# Patient Record
Sex: Male | Born: 1955 | Race: White | Hispanic: No | Marital: Married | Smoking: Current every day smoker
Health system: Southern US, Community
[De-identification: ages and names within clinical notes are randomized; demographics above are authoritative.]

## PROBLEM LIST (undated history)

## (undated) DIAGNOSIS — R112 Nausea with vomiting, unspecified: Secondary | ICD-10-CM

## (undated) DIAGNOSIS — H269 Unspecified cataract: Secondary | ICD-10-CM

## (undated) DIAGNOSIS — R0600 Dyspnea, unspecified: Secondary | ICD-10-CM

## (undated) DIAGNOSIS — F419 Anxiety disorder, unspecified: Secondary | ICD-10-CM

## (undated) DIAGNOSIS — F32A Depression, unspecified: Secondary | ICD-10-CM

## (undated) DIAGNOSIS — E119 Type 2 diabetes mellitus without complications: Secondary | ICD-10-CM

## (undated) DIAGNOSIS — F329 Major depressive disorder, single episode, unspecified: Secondary | ICD-10-CM

## (undated) DIAGNOSIS — D649 Anemia, unspecified: Secondary | ICD-10-CM

## (undated) DIAGNOSIS — E785 Hyperlipidemia, unspecified: Secondary | ICD-10-CM

## (undated) DIAGNOSIS — Z9889 Other specified postprocedural states: Secondary | ICD-10-CM

## (undated) HISTORY — PX: COLONOSCOPY: SHX174

## (undated) HISTORY — DX: Hyperlipidemia, unspecified: E78.5

## (undated) HISTORY — DX: Anxiety disorder, unspecified: F41.9

## (undated) HISTORY — DX: Anemia, unspecified: D64.9

## (undated) HISTORY — DX: Depression, unspecified: F32.A

## (undated) HISTORY — DX: Major depressive disorder, single episode, unspecified: F32.9

## (undated) HISTORY — PX: CATARACT EXTRACTION: SUR2

## (undated) HISTORY — DX: Unspecified cataract: H26.9

---

## 2000-02-12 ENCOUNTER — Emergency Department (HOSPITAL_COMMUNITY): Admission: EM | Admit: 2000-02-12 | Discharge: 2000-02-12 | Payer: Self-pay

## 2005-11-22 ENCOUNTER — Emergency Department (HOSPITAL_COMMUNITY): Admission: EM | Admit: 2005-11-22 | Discharge: 2005-11-22 | Payer: Self-pay | Admitting: Emergency Medicine

## 2005-11-25 ENCOUNTER — Emergency Department (HOSPITAL_COMMUNITY): Admission: EM | Admit: 2005-11-25 | Discharge: 2005-11-25 | Payer: Self-pay | Admitting: Emergency Medicine

## 2005-11-27 ENCOUNTER — Emergency Department (HOSPITAL_COMMUNITY): Admission: EM | Admit: 2005-11-27 | Discharge: 2005-11-27 | Payer: Self-pay | Admitting: Emergency Medicine

## 2005-11-30 ENCOUNTER — Emergency Department (HOSPITAL_COMMUNITY): Admission: EM | Admit: 2005-11-30 | Discharge: 2005-11-30 | Payer: Self-pay | Admitting: Family Medicine

## 2005-12-15 ENCOUNTER — Emergency Department (HOSPITAL_COMMUNITY): Admission: EM | Admit: 2005-12-15 | Discharge: 2005-12-15 | Payer: Self-pay | Admitting: Emergency Medicine

## 2007-04-24 ENCOUNTER — Ambulatory Visit: Payer: Self-pay | Admitting: Gastroenterology

## 2007-05-07 ENCOUNTER — Ambulatory Visit: Payer: Self-pay | Admitting: Gastroenterology

## 2007-05-07 DIAGNOSIS — K5730 Diverticulosis of large intestine without perforation or abscess without bleeding: Secondary | ICD-10-CM | POA: Insufficient documentation

## 2007-05-07 DIAGNOSIS — D126 Benign neoplasm of colon, unspecified: Secondary | ICD-10-CM | POA: Insufficient documentation

## 2007-05-23 ENCOUNTER — Ambulatory Visit: Payer: Self-pay | Admitting: Gastroenterology

## 2007-05-23 LAB — CONVERTED CEMR LAB
Fecal Occult Blood: NEGATIVE
OCCULT 1: NEGATIVE
OCCULT 4: NEGATIVE

## 2007-12-17 DIAGNOSIS — F411 Generalized anxiety disorder: Secondary | ICD-10-CM | POA: Insufficient documentation

## 2007-12-17 DIAGNOSIS — F329 Major depressive disorder, single episode, unspecified: Secondary | ICD-10-CM | POA: Insufficient documentation

## 2007-12-17 DIAGNOSIS — F3289 Other specified depressive episodes: Secondary | ICD-10-CM | POA: Insufficient documentation

## 2007-12-17 DIAGNOSIS — F32A Depression, unspecified: Secondary | ICD-10-CM | POA: Insufficient documentation

## 2007-12-17 DIAGNOSIS — F17200 Nicotine dependence, unspecified, uncomplicated: Secondary | ICD-10-CM

## 2007-12-17 DIAGNOSIS — J410 Simple chronic bronchitis: Secondary | ICD-10-CM | POA: Insufficient documentation

## 2009-10-22 ENCOUNTER — Emergency Department (HOSPITAL_COMMUNITY): Admission: EM | Admit: 2009-10-22 | Discharge: 2009-10-22 | Payer: Self-pay | Admitting: Emergency Medicine

## 2010-12-01 LAB — POCT I-STAT, CHEM 8
Chloride: 106 mEq/L (ref 96–112)
HCT: 47 % (ref 39.0–52.0)
Hemoglobin: 16 g/dL (ref 13.0–17.0)
TCO2: 27 mmol/L (ref 0–100)

## 2010-12-01 LAB — RAPID URINE DRUG SCREEN, HOSP PERFORMED
Amphetamines: NOT DETECTED
Cocaine: NOT DETECTED
Tetrahydrocannabinol: NOT DETECTED

## 2011-01-25 NOTE — Assessment & Plan Note (Signed)
[facility]                         GASTROENTEROLOGY OFFICE NOTE   Jeremy Shannon, Jeremy Shannon                    MRN:          [id number]  DATE:04/24/2007                            DOB:          Mar 20, 1956    INDICATIONS:  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 Perrin Maltese for evaluation of guaiac-  positive stools.   Jeremy Shannon has been in fairly good health all his life, without serious  medical or surgical problems, except for chronic depression.  Patient,  in early July, developed some acute diarrhea and fever and was seen by  Dr. Perrin Maltese and was found to have guaiac-positive stools.  About that same  time, he started taking NSAIDs and had some melanotic stools, but he  also was taking Pepto Bismol at that time.   Laboratory data was unremarkable, except for slight leukocytosis and  white count of 10,800.  Hemoglobin was 15.7.   Since that time, the patient has been asymptomatic, is having regular  bowel movements, denies dyspepsia or reflux symptoms.  He has no history  of hepatitis, pancreatitis, or hepatobiliary diseases.  He follows a  regular diet and denies alcohol abuse, but does smoke a pack of  cigarettes per day.  He has never had previous GI examinations.   PAST MEDICAL HISTORY:  Remarkable for depression, treated by Dr. Betti Cruz.  He is on fluvoxamine 100 mg t.i.d. and Seroquel 100 mg at bedtime with  p.r.n. alprazolam.   In the past, has had reactions to PREDNISONE.   FAMILY HISTORY:  Positive for breast cancer in his mother, but no known  gastrointestinal processes.   SOCIAL HISTORY:  He is separated from his wife.  He lives by himself.  He has two years of college education.  He works as a Engineer, materials.  He smokes a pack of cigarettes per day and denies ethanol or IV drug  use.   REVIEW OF SYSTEMS:  Otherwise noncontributory, except for some chronic  tinnitus, which is  exacerbated by Prilosec use, given empirically by Dr.  Perrin Maltese.  He denies any cardiovascular, pulmonary, or systemic symptoms at  this time.   EXAMINATION:  He is a healthy-appearing white male, in no distress,  appearing his stated age.  I cannot appreciate stigmata of chronic liver  disease.  He is 5 feet 9 inches tall, weighs 215 pounds.  Blood pressure is  100/74, and pulse was 80 and regular.  I could not appreciate stigmata of chronic liver disease or thyromegaly.  His chest was clear.  He was in a regular rhythm, without murmurs, gallops or rubs.  I could not appreciate hepatosplenomegaly, abdominal masses or  tenderness.  Peripheral extremities were unremarkable.  Mental status was clear.  Recta exam was deferred.   ASSESSMENT:  1. Guaiac-positive stools, rule out false positive test, versus      underlying colonic polyposis.  2. Nicotine addiction, without history of chronic lung disease.  3. Chronic major depression, on major antidepressants, followed by      psychiatry.  4. Family history of breast carcinoma in his mother.  RECOMMENDATIONS:  1. Outpatient colonoscopy at his convenience.  2. Continue other medications, listed above.     Vania Rea. Jarold Motto, MD, Caleen Essex, FAGA  Electronically Signed    DRP/MedQ  DD: 04/24/2007  DT: 04/25/2007  Job #: [id number]   cc:   Jonita Albee, M.D.  Daine Floras, M.D.

## 2011-03-17 ENCOUNTER — Emergency Department (HOSPITAL_COMMUNITY)
Admission: EM | Admit: 2011-03-17 | Discharge: 2011-03-17 | Disposition: A | Discharge status: Home / Self Care | Payer: Self-pay | Attending: Emergency Medicine | Admitting: Emergency Medicine

## 2011-03-17 ENCOUNTER — Emergency Department (HOSPITAL_COMMUNITY): Payer: Self-pay

## 2011-03-17 DIAGNOSIS — R42 Dizziness and giddiness: Secondary | ICD-10-CM | POA: Insufficient documentation

## 2011-03-17 DIAGNOSIS — F17200 Nicotine dependence, unspecified, uncomplicated: Secondary | ICD-10-CM | POA: Insufficient documentation

## 2011-03-17 DIAGNOSIS — F329 Major depressive disorder, single episode, unspecified: Secondary | ICD-10-CM | POA: Insufficient documentation

## 2011-03-17 DIAGNOSIS — R079 Chest pain, unspecified: Secondary | ICD-10-CM | POA: Insufficient documentation

## 2011-03-17 DIAGNOSIS — R0602 Shortness of breath: Secondary | ICD-10-CM | POA: Insufficient documentation

## 2011-03-17 DIAGNOSIS — F3289 Other specified depressive episodes: Secondary | ICD-10-CM | POA: Insufficient documentation

## 2011-03-21 ENCOUNTER — Emergency Department (HOSPITAL_COMMUNITY)
Admission: EM | Admit: 2011-03-21 | Discharge: 2011-03-21 | Disposition: A | Discharge status: Home / Self Care | Payer: Self-pay | Attending: Emergency Medicine | Admitting: Emergency Medicine

## 2011-03-21 DIAGNOSIS — R062 Wheezing: Secondary | ICD-10-CM | POA: Insufficient documentation

## 2011-03-21 DIAGNOSIS — R0602 Shortness of breath: Secondary | ICD-10-CM | POA: Insufficient documentation

## 2011-03-21 DIAGNOSIS — J40 Bronchitis, not specified as acute or chronic: Secondary | ICD-10-CM | POA: Insufficient documentation

## 2011-05-17 ENCOUNTER — Institutional Professional Consult (permissible substitution): Payer: Self-pay | Admitting: Internal Medicine

## 2011-09-08 ENCOUNTER — Ambulatory Visit: Payer: Self-pay

## 2011-09-08 DIAGNOSIS — F411 Generalized anxiety disorder: Secondary | ICD-10-CM

## 2011-10-06 ENCOUNTER — Ambulatory Visit: Payer: Self-pay

## 2011-10-06 DIAGNOSIS — R059 Cough, unspecified: Secondary | ICD-10-CM

## 2011-10-06 DIAGNOSIS — R0602 Shortness of breath: Secondary | ICD-10-CM

## 2011-10-06 DIAGNOSIS — J209 Acute bronchitis, unspecified: Secondary | ICD-10-CM

## 2011-10-06 DIAGNOSIS — R05 Cough: Secondary | ICD-10-CM

## 2011-12-31 ENCOUNTER — Ambulatory Visit: Payer: Self-pay | Admitting: Family Medicine

## 2011-12-31 VITALS — BP 89/59 | HR 89 | Temp 98.0°F | Resp 16 | Ht 69.0 in | Wt 194.6 lb

## 2011-12-31 DIAGNOSIS — F341 Dysthymic disorder: Secondary | ICD-10-CM

## 2011-12-31 DIAGNOSIS — F329 Major depressive disorder, single episode, unspecified: Secondary | ICD-10-CM

## 2011-12-31 DIAGNOSIS — F32A Depression, unspecified: Secondary | ICD-10-CM

## 2011-12-31 MED ORDER — ALPRAZOLAM 0.5 MG PO TABS: 0.5000 mg | ORAL_TABLET | Freq: Three times a day (TID) | ORAL | Status: DC | PRN

## 2011-12-31 NOTE — Progress Notes (Signed)
[facility]:  Office Visit  Chief Complaint:  Chief Complaint  Patient presents with  . Medication Refill    HPI: Jeremy Shannon is a 56 y.o. male who complains of  Medication refills for:  1. Anxiety/Depression. He is on Xanax 0.5 mg TID prn. He is also on seroquel and luvox but gets that free from Dr. Betti Cruz ( psych) He apparently does not get his Xanax from him, he comes to [place] for that. He denies any SEs, denies any abuse potential. Denies any SI/HI/hallucintations.   Past Medical History  Diagnosis Date  . Hyperlipidemia   . Depression   . Anemia    History reviewed. No pertinent past surgical history. History   Social History  . Marital Status: Legally Separated    Spouse Name: N/A    Number of Children: N/A  . Years of Education: N/A   Social History Main Topics  . Smoking status: Current Everyday Smoker -- 0.5 packs/day for 40 years    Types: Cigarettes  . Smokeless tobacco: None  . Alcohol Use: None  . Drug Use: None  . Sexually Active: None   Other Topics Concern  . None   Social History Narrative  . None   No family history on file. Allergies not on file Prior to Admission medications   Medication Sig Start Date End Date Taking? Authorizing Provider  ALPRAZolam Prudy Feeler) 0.5 MG tablet Take 0.5 mg by mouth 3 (three) times daily as needed.   Yes Historical Provider, MD  fluvoxaMINE (LUVOX) 100 MG tablet Take 100 mg by mouth at bedtime.   Yes Historical Provider, MD  QUEtiapine (SEROQUEL) 100 MG tablet Take 100 mg by mouth at bedtime.   Yes Historical Provider, MD     ROS: The patient denies fevers, chills, night sweats, unintentional weight loss, chest pain, palpitations, wheezing, dyspnea on exertion, nausea, vomiting, abdominal pain, dysuria, hematuria, melena, numbness, weakness, or tingling. Denies any SI/HI/hallucinations.  All other systems have been reviewed and were otherwise negative with the exception of those mentioned in  the HPI and as above.    PHYSICAL EXAM: Filed Vitals:   12/31/11 0827  BP: 89/59  Pulse: 89  Temp: 98 F (36.7 C)  Resp: 16   Filed Vitals:   12/31/11 0827  Height: 5\' 9"  (1.753 m)  Weight: 194 lb 9.6 oz (88.27 kg)   Body mass index is 28.74 kg/(m^2).  General: Alert, no acute distress HEENT:  Normocephalic, atraumatic, oropharynx patent.  Cardiovascular:  Regular rate and rhythm, no rubs murmurs or gallops.  No Carotid bruits, radial pulse intact. No pedal edema.  Respiratory: Clear to auscultation bilaterally.  No wheezes, rales, or rhonchi.  No cyanosis, no use of accessory musculature GI: No organomegaly, abdomen is soft and non-tender, positive bowel sounds.  No masses. Skin: No rashes. Neurologic: Facial musculature symmetric. Psychiatric: Patient is appropriate throughout our interaction. Lymphatic: No cervical lymphadenopathy Musculoskeletal: Gait intact.   LABS: Results for orders placed during the hospital encounter of 10/22/09  DRUG SCREEN PANEL, EMERGENCY      Component Value Range   Opiates NONE DETECTED  NONE DETECTED    Cocaine NONE DETECTED  NONE DETECTED    Benzodiazepines POSITIVE (*) NONE DETECTED    Amphetamines NONE DETECTED  NONE DETECTED    Tetrahydrocannabinol NONE DETECTED  NONE DETECTED    Barbiturates    NONE DETECTED    Value: NONE DETECTED  DRUG SCREEN FOR MEDICAL PURPOSES     ONLY.  IF CONFIRMATION IS NEEDED     FOR ANY PURPOSE, NOTIFY LAB     WITHIN 5 DAYS.                LOWEST DETECTABLE LIMITS     FOR URINE DRUG SCREEN     Drug Class       Cutoff (ng/mL)     Amphetamine      1000     Barbiturate      200     Benzodiazepine   200     Tricyclics       300     Opiates          300     Cocaine          300     THC              50  ETHANOL      Component Value Range   Alcohol, Ethyl (B)    0 - 10 (mg/dL)   Value: <5            LOWEST DETECTABLE LIMIT FOR     SERUM ALCOHOL IS 5 mg/dL     FOR MEDICAL PURPOSES ONLY    POCT I-STAT, CHEM 8      Component Value Range   Sodium 139  135 - 145 (mEq/L)   Potassium 4.2  3.5 - 5.1 (mEq/L)   Chloride 106  96 - 112 (mEq/L)   BUN 11  6 - 23 (mg/dL)   Creatinine, Ser 1.0  0.4 - 1.5 (mg/dL)   Glucose, Bld 89  70 - 99 (mg/dL)   Calcium, Ion 4.09 (*) 1.12 - 1.32 (mmol/L)   TCO2 27  0 - 100 (mmol/L)   Hemoglobin 16.0  13.0 - 17.0 (g/dL)   HCT 81.1  91.4 - 78.2 (%)     EKG/XRAY:   Primary read interpreted by Dr. Conley Rolls at [facility].   ASSESSMENT/PLAN: Encounter Diagnosis  Name Primary?  Marland Kitchen Anxiety and depression Yes    Refill Xanax 0.5 mg TID prn #90 with 3 refills. Needs to return to office for future refills. I will try to pull his drug profile for next time.    Loleta Frommelt PHUONG, DO 12/31/2011 3:08 PM

## 2012-02-10 ENCOUNTER — Ambulatory Visit: Payer: Self-pay | Admitting: Family Medicine

## 2012-02-10 VITALS — BP 110/72 | HR 60 | Temp 98.1°F | Resp 16 | Ht 68.0 in | Wt 188.4 lb

## 2012-02-10 DIAGNOSIS — R0609 Other forms of dyspnea: Secondary | ICD-10-CM

## 2012-02-10 DIAGNOSIS — R0600 Dyspnea, unspecified: Secondary | ICD-10-CM

## 2012-02-10 DIAGNOSIS — I959 Hypotension, unspecified: Secondary | ICD-10-CM

## 2012-02-10 MED ORDER — PREDNISONE 20 MG PO TABS: ORAL_TABLET | ORAL | Status: AC

## 2012-02-10 NOTE — Progress Notes (Signed)
Subjective: Patient is here for a couple of reasons today. He went to see the ophthalmologist's for arrangements for his cataract surgery in 2 weeks. When he was in the office they checked his blood pressure and systolic was around 90. They were concerned about his hypotension, and called an ambulance. He was not taking anywhere. He came in here to the office and needs a clearance note before he can have the surgery as planned.  He has been having shortness of breath of the last for 5 days with no coughing. He is not bringing up any phlegm. He has gotten this way in the past he has taken a course of prednisone and antibiotics and cleared up. In reviewing the old record it appears to be was last treated in January and last July for this.  Objective: White male alert and oriented. His TMs are normal. Throat clear. Neck supple without nodes or thyromegaly. No carotid bruits. Chest clear to auscultation. Heart regular without murmurs.  In reviewing his old medical record it appears that his highest blood pressure that we have gotten the last several years has been systolic of 122 and the lowest was a systolic of 86. He usually runs in the region of 100 systolic.  Assessment: Dyspnea Chronic anxiety disorder Cataract Low blood pressure  Plan: Check EKG. I believe a little low blood pressures consistent with his prior record, and requires no special attention. I do not feel that he needs antibiotics for this dyspnea, but will give him a brief course of prednisone and see if we can get his lungs clearing on up. He is to let me know if he is not doing better.

## 2012-02-10 NOTE — Patient Instructions (Signed)
Return if increasing shortness of breath or more phlegm production.

## 2012-02-11 DIAGNOSIS — Z0271 Encounter for disability determination: Secondary | ICD-10-CM

## 2012-04-26 ENCOUNTER — Ambulatory Visit: Payer: Self-pay | Admitting: Family Medicine

## 2012-04-26 VITALS — BP 84/54 | HR 81 | Temp 97.8°F | Resp 18 | Ht 69.0 in | Wt 186.8 lb

## 2012-04-26 DIAGNOSIS — F32A Depression, unspecified: Secondary | ICD-10-CM

## 2012-04-26 DIAGNOSIS — F341 Dysthymic disorder: Secondary | ICD-10-CM

## 2012-04-26 DIAGNOSIS — F329 Major depressive disorder, single episode, unspecified: Secondary | ICD-10-CM

## 2012-04-26 MED ORDER — ALPRAZOLAM 0.5 MG PO TABS: 0.5000 mg | ORAL_TABLET | Freq: Three times a day (TID) | ORAL | Status: DC | PRN

## 2012-04-26 NOTE — Progress Notes (Signed)
[facility]:  Office Visit  Chief Complaint:  Chief Complaint  Patient presents with  . Anxiety    needs a refill on xanax    HPI: Jeremy Shannon is a 56 y.o. male who complains of  Refills for xanax. Denies HI/SI. No other sxs.   Past Medical History  Diagnosis Date  . Hyperlipidemia   . Depression   . Anemia    No past surgical history on file. History   Social History  . Marital Status: Legally Separated    Spouse Name: N/A    Number of Children: N/A  . Years of Education: N/A   Social History Main Topics  . Smoking status: Current Everyday Smoker -- 0.5 packs/day for 40 years    Types: Cigarettes  . Smokeless tobacco: None  . Alcohol Use: None  . Drug Use: None  . Sexually Active: None   Other Topics Concern  . None   Social History Narrative  . None   No family history on file. No Known Allergies Prior to Admission medications   Medication Sig Start Date End Date Taking? Authorizing Provider  albuterol-ipratropium (COMBIVENT) 18-103 MCG/ACT inhaler Inhale 2 puffs into the lungs every 6 (six) hours as needed.   Yes Historical Provider, MD  ALPRAZolam Prudy Feeler) 0.5 MG tablet Take 1 tablet (0.5 mg total) by mouth 3 (three) times daily as needed. 12/31/11  Yes Thao P Le, DO  fluvoxaMINE (LUVOX) 100 MG tablet Take 100 mg by mouth at bedtime.   Yes Historical Provider, MD  predniSONE (DELTASONE) 50 MG tablet Take 50 mg by mouth daily.   Yes Historical Provider, MD  QUEtiapine (SEROQUEL) 100 MG tablet Take 100 mg by mouth at bedtime.   Yes Historical Provider, MD     ROS: The patient denies fevers, chills, night sweats, unintentional weight loss, chest pain, palpitations, wheezing, dyspnea on exertion, nausea, vomiting, abdominal pain, dysuria, hematuria, melena, numbness, weakness, or tingling.   All other systems have been reviewed and were otherwise negative with the exception of those mentioned in the HPI and as above.    PHYSICAL  EXAM: Filed Vitals:   04/26/12 1513  BP: 84/54  Pulse: 81  Temp: 97.8 F (36.6 C)  Resp: 18   Filed Vitals:   04/26/12 1513  Height: 5\' 9"  (1.753 m)  Weight: 186 lb 12.8 oz (84.732 kg)   Body mass index is 27.59 kg/(m^2).  General: Alert, no acute distress HEENT:  Normocephalic, atraumatic, oropharynx patent.  Cardiovascular:  Regular rate and rhythm, no rubs murmurs or gallops.  No Carotid bruits, radial pulse intact. No pedal edema.  Respiratory: Clear to auscultation bilaterally.  No wheezes, rales, or rhonchi.  No cyanosis, no use of accessory musculature GI: No organomegaly, abdomen is soft and non-tender, positive bowel sounds.  No masses. Skin: No rashes. Neurologic: Facial musculature symmetric. Psychiatric: Patient is appropriate throughout our interaction. Lymphatic: No cervical lymphadenopathy Musculoskeletal: Gait intact.   LABS: Results for orders placed during the hospital encounter of 10/22/09  DRUG SCREEN PANEL, EMERGENCY      Component Value Range   Opiates NONE DETECTED  NONE DETECTED   Cocaine NONE DETECTED  NONE DETECTED   Benzodiazepines POSITIVE (*) NONE DETECTED   Amphetamines NONE DETECTED  NONE DETECTED   Tetrahydrocannabinol NONE DETECTED  NONE DETECTED   Barbiturates    NONE DETECTED   Value: NONE DETECTED            DRUG SCREEN FOR MEDICAL PURPOSES  ONLY.  IF CONFIRMATION IS NEEDED     FOR ANY PURPOSE, NOTIFY LAB     WITHIN 5 DAYS.                LOWEST DETECTABLE LIMITS     FOR URINE DRUG SCREEN     Drug Class       Cutoff (ng/mL)     Amphetamine      1000     Barbiturate      200     Benzodiazepine   200     Tricyclics       300     Opiates          300     Cocaine          300     THC              50  ETHANOL      Component Value Range   Alcohol, Ethyl (B)    0 - 10 mg/dL   Value: <5            LOWEST DETECTABLE LIMIT FOR     SERUM ALCOHOL IS 5 mg/dL     FOR MEDICAL PURPOSES ONLY  POCT I-STAT, CHEM 8      Component Value  Range   Sodium 139  135 - 145 mEq/L   Potassium 4.2  3.5 - 5.1 mEq/L   Chloride 106  96 - 112 mEq/L   BUN 11  6 - 23 mg/dL   Creatinine, Ser 1.0  0.4 - 1.5 mg/dL   Glucose, Bld 89  70 - 99 mg/dL   Calcium, Ion 4.09 (*) 1.12 - 1.32 mmol/L   TCO2 27  0 - 100 mmol/L   Hemoglobin 16.0  13.0 - 17.0 g/dL   HCT 81.1  91.4 - 78.2 %     EKG/XRAY:   Primary read interpreted by Dr. Conley Rolls at [facility].   ASSESSMENT/PLAN: Encounter Diagnosis  Name Primary?  Marland Kitchen Anxiety and depression Yes   Advise patient that I would like to change him to Klonopin on our next visit. Patient will think about it.  I looked at his DEA profile and he is compliant with his rx and not getting it from anywhere else. He is also getting Suboxone by Dr. Channing Mutters. He has been on Xanax for 20 years, Luvox 15 years, and Seroquel x 5 years. Hesitant to change. Still sees Dr. Betti Cruz twice a year for his Luvox and Seroquel.       Hamilton Capri PHUONG, DO 04/26/2012 3:26 PM

## 2012-06-12 DIAGNOSIS — Z0271 Encounter for disability determination: Secondary | ICD-10-CM

## 2012-07-06 ENCOUNTER — Emergency Department (INDEPENDENT_AMBULATORY_CARE_PROVIDER_SITE_OTHER)
Admission: EM | Admit: 2012-07-06 | Discharge: 2012-07-06 | Disposition: A | Discharge status: Home / Self Care | Payer: Medicaid Other | Source: Home / Self Care

## 2012-07-06 ENCOUNTER — Encounter (HOSPITAL_COMMUNITY): Payer: Self-pay | Admitting: *Deleted

## 2012-07-06 DIAGNOSIS — F411 Generalized anxiety disorder: Secondary | ICD-10-CM

## 2012-07-06 DIAGNOSIS — F419 Anxiety disorder, unspecified: Secondary | ICD-10-CM

## 2012-07-06 MED ORDER — LORAZEPAM 1 MG PO TABS: 1.0000 mg | ORAL_TABLET | Freq: Three times a day (TID) | ORAL | Status: DC

## 2012-07-06 NOTE — ED Provider Notes (Signed)
History     CSN: [id number]  Arrival date & time 07/06/12  1226   None     Chief Complaint  Patient presents with  . Panic Attack    (Consider location/radiation/quality/duration/timing/severity/associated sxs/prior treatment) HPI Comments: 56 year old man who states that he is here because he feels panicky and scared him and he Z. States he feels strange and unusual. He is taking Xanax instead will prescribe him by Dr. Alwyn Ren at another urgent care. He states he has plenty of Xanax remaining but feels that it is not helping him. His dose of Xanax is 1/2-1.5 mg tablet during the day in 1-2 tablets at night. This apparently is a subtherapeutic dose for him and when I advised that he may be able to increase the dose of his Xanax he stated that he just did not want to take too much.   Past Medical History  Diagnosis Date  . Hyperlipidemia   . Depression   . Anemia     History reviewed. No pertinent past surgical history.  Family History  Problem Relation Age of Onset  . Family history unknown: Yes    History  Substance Use Topics  . Smoking status: Current Every Day Smoker -- 0.5 packs/day for 40 years    Types: Cigarettes  . Smokeless tobacco: Not on file  . Alcohol Use: No      Review of Systems  Constitutional: Negative for fever, activity change, appetite change and fatigue.  HENT: Negative.   Respiratory: Negative.   Cardiovascular: Negative.   Gastrointestinal: Negative.   Musculoskeletal: Negative.   Neurological: Negative.   Psychiatric/Behavioral: Negative for suicidal ideas. The patient is nervous/anxious.        Denies visual or auditory hallucinations. Eyes SI and HI    Allergies  Review of patient's allergies indicates no known allergies.  Home Medications   Current Outpatient Rx  Name Route Sig Dispense Refill  . ALPRAZOLAM 0.5 MG PO TABS Oral Take 1 tablet (0.5 mg total) by mouth 3 (three) times daily as needed. 90 tablet 3  . FLUVOXAMINE  MALEATE 100 MG PO TABS Oral Take 100 mg by mouth at bedtime.    Marland Kitchen QUETIAPINE FUMARATE 100 MG PO TABS Oral Take 100 mg by mouth at bedtime.    Maximino Greenland 18-103 MCG/ACT IN AERO Inhalation Inhale 2 puffs into the lungs every 6 (six) hours as needed.    Marland Kitchen LORAZEPAM 1 MG PO TABS Oral Take 1 tablet (1 mg total) by mouth every 8 (eight) hours. 2 tablet 0  . PREDNISONE 50 MG PO TABS Oral Take 50 mg by mouth daily.      BP 108/77  Pulse 82  Temp 98 F (36.7 C) (Oral)  Resp 20  SpO2 96%  Physical Exam  Constitutional: He is oriented to person, place, and time. He appears well-developed and well-nourished. No distress.  HENT:  Head: Normocephalic and atraumatic.  Neck: Normal range of motion. Neck supple.  Pulmonary/Chest: Effort normal.  Musculoskeletal: Normal range of motion.  Neurological: He is alert and oriented to person, place, and time. No cranial nerve deficit.  Skin: Skin is warm and dry.  Psychiatric: His mood appears not anxious. His affect is not angry and not blunt. He is slowed. He is not agitated, not aggressive, is not hyperactive, not actively hallucinating and not combative. Thought content is not delusional. Cognition and memory are not impaired. He does not express inappropriate judgment. He expresses no homicidal and no suicidal ideation. He expresses  no suicidal plans and no homicidal plans.       Speech is a little slow in torpid but not necessarily slurred.  he has impaired judgment regarding his medications. His reasoning has no merit or logic  as to why he does not wish to increase his very low dose of Xanax as a treatment for his stated anxiety.  His posturing is completely relaxed , no tremors or rapid breathing, no diaphoresis. skin is warm and dry. He is attentive.    ED Course  Procedures (including critical care time)  Labs Reviewed - No data to display No results found.   1. Anxiety       MDM  Ativan 1 mg q. 8 hours when necessary  nerves.... #2 tablets total. He has both a psychiatrist and I prescribed her at an urgent care at that prescribed his Seroquel and Xanax. He this be managed either one of these persons. He states he doesn't like a psychiatrist and can't see Dr. Alwyn Ren do to Medicaid problems. He is advised that we will not be able to manage his anxiety and since he already has sufficient amount of Xanax and Seroquel there is no need to had another benzodiazepine on top of what he already has he declines to increase this very small and subtherapeutic dose of Xanax to assist with his anxiety because 2 years ago when he was at the [facility] long emergency department they gave him 14 tablets of Ativan and that made him feel better than the Xanax. He is certainly in no apparent distress. He does not appear anxious and he is not having a panic attack.        Hayden Rasmussen, NP 07/06/12 1450

## 2012-07-06 NOTE — ED Notes (Signed)
Pt reports panic attack with hx of same - states that ativan has helped in past .

## 2012-07-06 NOTE — ED Provider Notes (Signed)
Medical screening examination/treatment/procedure(s) were performed by non-physician practitioner and as supervising physician I was immediately available for consultation/collaboration.  Leslee Home, M.D.   Reuben Likes, MD 07/06/12 [phone]

## 2012-08-24 ENCOUNTER — Ambulatory Visit: Payer: Self-pay | Admitting: Family Medicine

## 2012-08-24 VITALS — BP 115/78 | HR 79 | Temp 97.8°F | Resp 17 | Ht 68.5 in | Wt 199.0 lb

## 2012-08-24 DIAGNOSIS — F419 Anxiety disorder, unspecified: Secondary | ICD-10-CM

## 2012-08-24 DIAGNOSIS — L989 Disorder of the skin and subcutaneous tissue, unspecified: Secondary | ICD-10-CM

## 2012-08-24 DIAGNOSIS — F32A Depression, unspecified: Secondary | ICD-10-CM

## 2012-08-24 DIAGNOSIS — F341 Dysthymic disorder: Secondary | ICD-10-CM

## 2012-08-24 MED ORDER — ALPRAZOLAM 0.5 MG PO TABS: 0.5000 mg | ORAL_TABLET | Freq: Three times a day (TID) | ORAL | Status: DC | PRN

## 2012-08-24 NOTE — Addendum Note (Signed)
Addended by: HOPPER, DAVID H on: 08/24/2012 03:41 PM   Modules accepted: Orders

## 2012-08-24 NOTE — Patient Instructions (Addendum)
I will let you know the results of the skin biopsy in the next week or so. If you do not hear from me by 2 weeks from now call back  Continue using the Xanax, but try to minimize medication as possible

## 2012-08-24 NOTE — Progress Notes (Signed)
Subjective: Patient here for refill of his anxiety medications. 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 No major new complaints. I asked him about a spot on his forehead and he says that he had scratched it, Stann Mainland. retaining him. He thought he might have some eczema , but has not gone away.  Objective: No acute distress. He has a B. skin lesion mid forehead about 3 CM in diameter. He is excoriated just below it. This is suspicious looking.  Chest clear. Heart regular without murmurs.  Assessment: Anxiety disorder Suspicious skin lesion on forehead  Plan: Biopsy forehead. Patient agreed to that. Represcribed Xanax  Return in the spring  Procedure note  preprocedure diagnosis suspicious skin lesion of forehead, possible basal skin cancer Postprocedure diagnosis: Suspicious skin lesion Procedure: Lesion was numbed with lidocaine after a little while chloride. A 3 mm punch was taken. It kept bleeding and had to cauterize it with a little silver nitrate. As tolerated well. Specimen was sent for pathology.

## 2012-12-10 ENCOUNTER — Ambulatory Visit: Payer: Self-pay | Admitting: Family Medicine

## 2012-12-10 VITALS — BP 116/76 | HR 71 | Temp 98.2°F | Resp 16 | Ht 69.0 in | Wt 207.6 lb

## 2012-12-10 DIAGNOSIS — F4323 Adjustment disorder with mixed anxiety and depressed mood: Secondary | ICD-10-CM

## 2012-12-10 MED ORDER — CLONAZEPAM 0.5 MG PO TABS: ORAL_TABLET | ORAL | Status: DC

## 2012-12-10 NOTE — Patient Instructions (Addendum)
We are going to try changing you from xanax to klonopin (clonazepam) for your anxiety. Remember, the purpose of this change is to allow you to take less medication, not more.  I have given you an rx for clonazepam 0.5 mg, one three times a day.  Please try taking a 1/2 pill at first, as this may be a sufficient dose for you.  If needed, you may increase to a whole tablet.  Please give [place] a call or an email in about one week with an update, sooner if needed

## 2012-12-10 NOTE — Progress Notes (Signed)
[facility] [address] [place] [address] [phone]  Date:  12/10/2012   Name:  Jeremy Shannon   DOB:  09-03-1956   MRN:  [id number]  PCP:  No primary provider on file.    Chief Complaint: Medication Refill   History of Present Illness:  Jeremy Shannon is a 57 y.o. very pleasant male patient who presents with the following:  Here today for a medication refill.  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  He reports that Dr. Conley Rolls has been trying to get him to change from xanax to klonopin, and he is now ready to take this step. He is taking 0.5 mg of xanax TID right now. He also takes seroquel and luvox .   He has been taking xanax 0.5 TID for about 20 years.  However, he still does not some residual anxiety symptoms.  "I just can't tell if it's doing anything."  However, he does report that when he did not take it for 3 or 4 days in the past he "felt sick."    He still seems very indecisive about changing from xanax to clonopin, but after a long discussion he did decide that he wants to try clonazepam after al.  He tried several different times to convince me to presribe a higher dose of clonazepam.  I explained that the purpose behind this change is not to increase the amount of benzodiazepine he is taking.    Dr. Betti Cruz prescribes his other medications his luvox and seroquel.  He does feel that his symptoms are under acceptable control, he is able to work and interact with others.  No SI.  Patient Active Problem List  Diagnosis  . COLONIC POLYPS  . ANXIETY  . TOBACCO ABUSE  . DEPRESSION  . DIVERTICULOSIS, COLON    Past Medical History  Diagnosis Date  . Hyperlipidemia   . Depression   . Anemia   . Cataract   . Anxiety     No past surgical history on file.  History  Substance Use Topics  . Smoking status: Current Every Day Smoker -- 1.00 packs/day for 43 years    Types: Cigarettes  . Smokeless tobacco: Not on file  .  Alcohol Use: No    No family history on file.  No Known Allergies  Medication list has been reviewed and updated.  Current Outpatient Prescriptions on File Prior to Visit  Medication Sig Dispense Refill  . ALPRAZolam (XANAX) 0.5 MG tablet Take 1 tablet (0.5 mg total) by mouth 3 (three) times daily as needed.  90 tablet  3  . fluvoxaMINE (LUVOX) 100 MG tablet Take 100 mg by mouth at bedtime.      Marland Kitchen QUEtiapine (SEROQUEL) 100 MG tablet Take 100 mg by mouth at bedtime.       No current facility-administered medications on file prior to visit.    Review of Systems:  As per HPI- otherwise negative.  Physical Examination: Filed Vitals:   12/10/12 0803  BP: 116/76  Pulse: 71  Temp: 98.2 F (36.8 C)  Resp: 16   Filed Vitals:   12/10/12 0803  Height: 5\' 9"  (1.753 m)  Weight: 207 lb 9.6 oz (94.167 kg)   Body mass index is 30.64 kg/(m^2). Ideal Body Weight: Weight in (lb) to have BMI = 25: 168.9  GEN: WDWN, NAD, Non-toxic, A & O x 3 HEENT: Atraumatic, Normocephalic. Neck supple. No masses, No LAD. Ears  and Nose: No external deformity. CV: RRR, No M/G/R. No JVD. No thrill. No extra heart sounds. PULM: CTA B, no wheezes, crackles, rhonchi. No retractions. No resp. distress. No accessory muscle use. EXTR: No c/c/e NEURO Normal gait.  PSYCH: Normally interactive. Conversant. Not depressed or anxious appearing.  Calm demeanor.    Assessment and Plan: Adjustment disorder with mixed anxiety and depressed mood - Plan: clonazePAM (KLONOPIN) 0.5 MG tablet  We are going to change from xanax to klonopin.  Will start with 0.25 mg klonopin TID- however did give the 0.5 mg pills, #90 in case he needs to increase to 0.5 mg TID.  He is to call or RTC if things are not working out with this change.    See patient instructions for more details.  He is aware that he is to take xanax OR klonopin, not both.    Signed Abbe Amsterdam, MD

## 2012-12-13 ENCOUNTER — Ambulatory Visit (INDEPENDENT_AMBULATORY_CARE_PROVIDER_SITE_OTHER): Payer: Self-pay | Admitting: Family Medicine

## 2012-12-13 VITALS — BP 132/70 | HR 86 | Temp 98.5°F | Resp 16 | Ht 68.0 in | Wt 204.0 lb

## 2012-12-13 DIAGNOSIS — F1920 Other psychoactive substance dependence, uncomplicated: Secondary | ICD-10-CM | POA: Insufficient documentation

## 2012-12-13 DIAGNOSIS — F4323 Adjustment disorder with mixed anxiety and depressed mood: Secondary | ICD-10-CM

## 2012-12-13 DIAGNOSIS — F411 Generalized anxiety disorder: Secondary | ICD-10-CM

## 2012-12-13 NOTE — Progress Notes (Signed)
  Subjective:    Patient ID: Jeremy Shannon, male    DOB: 1956-04-03, 57 y.o.   MRN: [id number] Chief Complaint  Patient presents with  . Medication Refill    change meds    HPI  Has been on xanax 0.5mg  tid - usually sees Dr. Alwyn Ren.  Then he saw Dr. Conley Rolls who tried to transition him to klonopin 0.5mg  tid but he was reluctant and on Monday when he saw Dr. Dallas Schimke he agreed to switch but he doesn't feel right, it is much to strong - (Dr. Dallas Schimke recommended that he take 1/2 tab tid but he started on 1 tab tid), then he started feeling very depressed the next day and developed restless leg - couldn't get control them, then he developed diarrhea and vomiting and had a panic/out of body experience.   He found 2 old xanax in a drawer which he took and immed felt better - he thinks he is allergic to the klonopin.  Sees Dr. Betti Cruz only several time a year - he is not prescribing his benzos because he was started on these by a family doctor long before he was on other psych meds so just have always been seperate.  Is weaning off suboxone - almost on nothing - rx'ed by Dr. Salvadore Farber.  He needs to get back xanax today.  Past Medical History  Diagnosis Date  . Hyperlipidemia   . Depression   . Anemia   . Cataract   . Anxiety      Review of Systems    BP 132/70  Pulse 86  Temp(Src) 98.5 F (36.9 C) (Oral)  Resp 16  Ht 5\' 8"  (1.727 m)  Wt 204 lb (92.534 kg)  BMI 31.03 kg/m2  SpO2 96% Objective:   Physical Exam        Assessment & Plan:  OV waived/ NO CHARGE -  I was quite concerned by pt's sudden insistent need to get back onto xanax due to his "klonopin allergy" and his reluctance to accept the possibility of his xanax withdrawal being the actual cause of his symptoms - in which case he has already been through most of the symptoms so should definitely not restart in which case he will be on the med for the rest of his life.  In addition, I am concerned he is not being honest about his  suboxone use - he does not appear to be weaning off per the [facility] as he is getting the same amount - 30 8mg  tabs/mo every month for 6 mos - and usu pt's are not permitted to be on suboxone and bzd. He also definitely did not want me to contact his psychiatrist Dr. Betti Cruz - about his benzo use - which I simply wanted to do to coordinate his care - figured it would result in less dr.'s visits for this pt who does not have insurance.  Also, pt had not brought his #90 of klonopin with him and I did not feel comfortable giving him a new rx w/o disposing of his current pills.  Explained to pt our new controlled drug policy and that I was unwilling to rx xanax.  He can continue on his current klonopin, f/u w/ Dr. Betti Cruz, or see a different provider. Pt wants to return to see Dr. Alwyn Ren tomorrow to discuss options further.

## 2012-12-14 ENCOUNTER — Ambulatory Visit: Payer: Self-pay | Admitting: Family Medicine

## 2012-12-14 VITALS — BP 100/62 | HR 90 | Temp 98.7°F | Resp 16 | Ht 69.0 in | Wt 206.0 lb

## 2012-12-14 DIAGNOSIS — F419 Anxiety disorder, unspecified: Secondary | ICD-10-CM

## 2012-12-14 DIAGNOSIS — F341 Dysthymic disorder: Secondary | ICD-10-CM

## 2012-12-14 DIAGNOSIS — F32A Depression, unspecified: Secondary | ICD-10-CM

## 2012-12-14 MED ORDER — ALPRAZOLAM 0.5 MG PO TABS: ORAL_TABLET | ORAL | Status: DC

## 2012-12-14 NOTE — Progress Notes (Signed)
Subjective: 57 year old man well-known to me. He has been feeling lousy this week. He had seen one of the other physicians earlier in the week since it was a day I was not. She did try to transition him onto clonazepam. He has felt lousy since then, had some nausea and stomach discomfort, leg pains and restlessness, and "out of body" feelings.  He says he has been feeling fairly well before then. He gradually weaned himself off the Suboxone. He stopped that a few weeks back. His psychiatrist for refill of his other medications just for a couple of weeks ago. And they were represcribed. The psychiatrist told him to take the Luvox 200 mg daily on a consistent basis. He has not only been doing that. Objective: Chest clear. Heart regular without murmurs. No CVA tenderness.  Assessment: Chronic anxiety disorder Generalized malaise  Plan: Represcribed the Xanax, stressed the fact he cannot over take it. I told the pharmacy not to prescribe an early in all. He has been pretty consistent intake in the past. Urged to take the Luvox faithfully and probably should be taking the Seroquel faithfully unless he discusses this with her psychiatrist.  Return this summer for

## 2012-12-14 NOTE — Patient Instructions (Signed)
[facility] Policy for Prescribing Controlled Substances (Revised 07/2012) 1. Prescriptions for controlled substances will be filled by ONE provider at [facility] with whom you have established and developed a plan for your care, including follow-up. 2. You are encouraged to schedule an appointment with your prescriber at our appointment center for follow-up visits whenever possible. 3. If you request a prescription for the controlled substance while at [facility] for an acute problem (with someone other than your regular prescriber), you MAY be given a ONE-TIME prescription for a 30-day supply of the controlled substance, to allow time for you to return to see your regular prescriber for additional prescriptions.      Return about a week before your last prescription runs out, sooner if problems.  Labs next visit.

## 2013-03-02 ENCOUNTER — Ambulatory Visit: Payer: Self-pay | Admitting: Emergency Medicine

## 2013-03-02 VITALS — BP 108/75 | HR 74 | Temp 97.9°F | Resp 18 | Ht 69.0 in | Wt 206.0 lb

## 2013-03-02 DIAGNOSIS — I959 Hypotension, unspecified: Secondary | ICD-10-CM

## 2013-03-02 DIAGNOSIS — F341 Dysthymic disorder: Secondary | ICD-10-CM

## 2013-03-02 MED ORDER — HYDROXYZINE PAMOATE 25 MG PO CAPS: 25.0000 mg | ORAL_CAPSULE | Freq: Three times a day (TID) | ORAL | Status: DC | PRN

## 2013-03-02 NOTE — Progress Notes (Signed)
  Subjective:    Patient ID: MCCLAIN SHALL, male    DOB: 09/20/1955, 57 y.o.   MRN: [id number]  HPI patient enters to get a refill on his hydroxyzine because he has felt weak and jittery. He does walk everyday about 5 miles. He does not drink much fluids during the day. He is a patient of Dr. Jae Dire. He is currently on Xanax, Seroquel, and Luvox.    Review of Systems     Objective:   Physical Exam patient is alert and cooperative in no distress. His neck is supple. Chest is clear to auscultation and percussion. Heart regular rate no murmurs  EKG normal sinus rhythm QT normal he does have a sinus bradycardia.      Assessment & Plan:  I repeated his blood pressure and it was 102/70. I advised him to drink more fluids during the day. I did refill his hydroxyzine and encouraged him to followup with Dr. Betti Cruz this week

## 2013-04-03 ENCOUNTER — Ambulatory Visit: Payer: Self-pay | Admitting: Emergency Medicine

## 2013-04-03 VITALS — BP 92/72 | HR 77 | Temp 98.4°F | Resp 18 | Wt 206.0 lb

## 2013-04-03 DIAGNOSIS — F341 Dysthymic disorder: Secondary | ICD-10-CM

## 2013-04-03 DIAGNOSIS — F32A Depression, unspecified: Secondary | ICD-10-CM

## 2013-04-03 DIAGNOSIS — F419 Anxiety disorder, unspecified: Secondary | ICD-10-CM

## 2013-04-03 MED ORDER — ALPRAZOLAM 0.5 MG PO TABS: ORAL_TABLET | ORAL | Status: DC

## 2013-04-03 NOTE — Progress Notes (Signed)
[facility] [address], [place] [address] [phone]  Date:  04/03/2013   Name:  Jeremy Shannon   DOB:  1955/11/15   MRN:  [id number]  PCP:  Nilda Simmer, MD    Chief Complaint: Anxiety   History of Present Illness:  Jeremy Shannon is a 57 y.o. very pleasant male patient who presents with the following:  Treated since 1995 with xanax for anxiety.  Here for refill usually sees Dr Alwyn Ren.  No improvement with over the counter medications or other home remedies. Denies other complaint or health concern today. Reviewed old records  Patient Active Problem List   Diagnosis Date Noted  . Polysubstance dependence 12/13/2012  . ANXIETY 12/17/2007  . TOBACCO ABUSE 12/17/2007  . DEPRESSION 12/17/2007  . COLONIC POLYPS 05/07/2007  . DIVERTICULOSIS, COLON 05/07/2007    Past Medical History  Diagnosis Date  . Hyperlipidemia   . Depression   . Anemia   . Cataract   . Anxiety     Past Surgical History  Procedure Laterality Date  . Cataract extraction      History  Substance Use Topics  . Smoking status: Current Every Day Smoker -- 1.00 packs/day for 43 years    Types: Cigarettes  . Smokeless tobacco: Not on file  . Alcohol Use: No    No family history on file.  No Known Allergies  Medication list has been reviewed and updated.  Current Outpatient Prescriptions on File Prior to Visit  Medication Sig Dispense Refill  . ALPRAZolam (XANAX) 0.5 MG tablet Take maximum of one pill 3 times daily for anxiety  90 tablet  3  . fluvoxaMINE (LUVOX) 100 MG tablet Take 100 mg by mouth at bedtime.      . hydrOXYzine (VISTARIL) 25 MG capsule Take 1 capsule (25 mg total) by mouth 3 (three) times daily as needed for itching.  30 capsule  0  . QUEtiapine (SEROQUEL) 100 MG tablet Take 100 mg by mouth at bedtime.       No current facility-administered medications on file prior to visit.    Review of Systems:  As per HPI, otherwise negative.     Physical Examination: Filed Vitals:   04/03/13 1437  BP: 92/72  Pulse: 77  Temp: 98.4 F (36.9 C)  Resp: 18   Filed Vitals:   04/03/13 1437  Weight: 206 lb (93.441 kg)   Body mass index is 30.41 kg/(m^2). Ideal Body Weight:     GEN: WDWN, NAD, Non-toxic, Alert & Oriented x 3 HEENT: Atraumatic, Normocephalic.  Ears and Nose: No external deformity. EXTR: No clubbing/cyanosis/edema NEURO: Normal gait.  PSYCH: Normally interactive. Conversant. Not depressed or anxious appearing.  Calm demeanor.    Assessment and Plan: Discussed new narcotic policy Refill xanax Follow up with Dr Alwyn Ren  Signed,  Phillips Odor, MD

## 2013-04-03 NOTE — Patient Instructions (Signed)
[facility] Policy for Prescribing Controlled Substances (Revised 07/2012) 1. Prescriptions for controlled substances will be filled by ONE provider at [facility] with whom you have established and developed a plan for your care, including follow-up. 2. You are encouraged to schedule an appointment with your prescriber at our appointment center for follow-up visits whenever possible. 3. If you request a prescription for the controlled substance while at [facility] for an acute problem (with someone other than your regular prescriber), you MAY be given a ONE-TIME prescription for a 30-day supply of the controlled substance, to allow time for you to return to see your regular prescriber for additional prescriptions. Anxiety and Panic Attacks Your caregiver has informed you that you are having an anxiety or panic attack. There may be many forms of this. Most of the time these attacks come suddenly and without warning. They come at any time of day, including periods of sleep, and at any time of life. They may be strong and unexplained. Although panic attacks are very scary, they are physically harmless. Sometimes the cause of your anxiety is not known. Anxiety is a protective mechanism of the body in its fight or flight mechanism. Most of these perceived danger situations are actually nonphysical situations (such as anxiety over losing a job). CAUSES  The causes of an anxiety or panic attack are many. Panic attacks may occur in otherwise healthy people given a certain set of circumstances. There may be a genetic cause for panic attacks. Some medications may also have anxiety as a side effect. SYMPTOMS  Some of the most common feelings are:  Intense terror.  Dizziness, feeling faint.  Hot and cold flashes.  Fear of going crazy.  Feelings that nothing is real.  Sweating.  Shaking.  Chest pain or a fast heartbeat (palpitations).  Smothering, choking sensations.  Feelings of impending doom and that death is  near.  Tingling of extremities, this may be from over-breathing.  Altered reality (derealization).  Being detached from yourself (depersonalization). Several symptoms can be present to make up anxiety or panic attacks. DIAGNOSIS  The evaluation by your caregiver will depend on the type of symptoms you are experiencing. The diagnosis of anxiety or panic attack is made when no physical illness can be determined to be a cause of the symptoms. TREATMENT  Treatment to prevent anxiety and panic attacks may include:  Avoidance of circumstances that cause anxiety.  Reassurance and relaxation.  Regular exercise.  Relaxation therapies, such as yoga.  Psychotherapy with a psychiatrist or therapist.  Avoidance of caffeine, alcohol and illegal drugs.  Prescribed medication. SEEK IMMEDIATE MEDICAL CARE IF:   You experience panic attack symptoms that are different than your usual symptoms.  You have any worsening or concerning symptoms. Document Released: 08/29/2005 Document Revised: 11/21/2011 Document Reviewed: 12/31/2009 [facility] Patient Information 2014 [place], [place].

## 2013-04-12 DIAGNOSIS — Z0271 Encounter for disability determination: Secondary | ICD-10-CM

## 2013-07-24 ENCOUNTER — Ambulatory Visit: Payer: Self-pay | Admitting: Family Medicine

## 2013-07-24 VITALS — BP 100/82 | HR 68 | Temp 98.0°F | Resp 18 | Ht 69.0 in | Wt 206.2 lb

## 2013-07-24 DIAGNOSIS — F32A Depression, unspecified: Secondary | ICD-10-CM

## 2013-07-24 DIAGNOSIS — F419 Anxiety disorder, unspecified: Secondary | ICD-10-CM

## 2013-07-24 DIAGNOSIS — F341 Dysthymic disorder: Secondary | ICD-10-CM

## 2013-07-24 DIAGNOSIS — F329 Major depressive disorder, single episode, unspecified: Secondary | ICD-10-CM

## 2013-07-24 MED ORDER — ALPRAZOLAM 0.5 MG PO TABS: ORAL_TABLET | ORAL | Status: DC

## 2013-07-24 NOTE — Progress Notes (Signed)
Subjective

## 2013-07-24 NOTE — Patient Instructions (Signed)
Same medications  Sometime suggest getting labs

## 2013-08-27 DIAGNOSIS — Z0271 Encounter for disability determination: Secondary | ICD-10-CM

## 2013-12-21 ENCOUNTER — Ambulatory Visit: Payer: Self-pay | Admitting: Family Medicine

## 2013-12-21 VITALS — BP 102/70 | HR 74 | Temp 98.2°F | Resp 18 | Wt 204.0 lb

## 2013-12-21 DIAGNOSIS — F17200 Nicotine dependence, unspecified, uncomplicated: Secondary | ICD-10-CM

## 2013-12-21 DIAGNOSIS — F32A Depression, unspecified: Secondary | ICD-10-CM

## 2013-12-21 DIAGNOSIS — F419 Anxiety disorder, unspecified: Secondary | ICD-10-CM

## 2013-12-21 DIAGNOSIS — F329 Major depressive disorder, single episode, unspecified: Secondary | ICD-10-CM

## 2013-12-21 DIAGNOSIS — F341 Dysthymic disorder: Secondary | ICD-10-CM

## 2013-12-21 MED ORDER — ALPRAZOLAM 0.5 MG PO TABS: ORAL_TABLET | ORAL | Status: DC

## 2013-12-21 NOTE — Progress Notes (Signed)
Subjective: 58 year old man who is here for regular visit. He needs more Xanax. He's been on this for years, and cannot seem to get changed. He sees a psychiatrist who prescribes his other psychotropics. He gets some exercise. He continues to smoke. His wife continues to live and work and [place] she was just up here for a couple of weeks and they had a good time. Once again I urged him to consider moving on down there to [place] with her. No other physical complaints. He just had a cataract done. He needs to have dental work done next week.  Objective: Pleasant gentleman, more relaxed and sometimes. No acute complaints. TMs normal. Throat clear. Neck supple without nodes or thyromegaly. Chest clear. Heart regular without murmurs.  Assessment: Anxiety disorder with history of depression Tobacco use disorder Recent cataract surgery Poor dentition  Plan: Represcribed the Xanax for 4 months. He is to return at the end of that. Urged them to minimize use the medication. Call if problems.

## 2013-12-21 NOTE — Patient Instructions (Signed)
As we have discussed before, I would recommend trying to minimize the use of Xanax. On days that you're doing well cut out doses.  Try to get regular exercise  Continue to think seriously about when you're going to quit smoking.

## 2014-03-16 ENCOUNTER — Encounter (HOSPITAL_COMMUNITY): Payer: Self-pay | Admitting: Emergency Medicine

## 2014-03-16 ENCOUNTER — Emergency Department (HOSPITAL_COMMUNITY)
Admission: EM | Admit: 2014-03-16 | Discharge: 2014-03-16 | Disposition: A | Discharge status: Home / Self Care | Payer: Medicaid Other | Source: Home / Self Care | Attending: Family Medicine | Admitting: Family Medicine

## 2014-03-16 DIAGNOSIS — F411 Generalized anxiety disorder: Secondary | ICD-10-CM

## 2014-03-16 DIAGNOSIS — F418 Other specified anxiety disorders: Secondary | ICD-10-CM

## 2014-03-16 MED ORDER — GI COCKTAIL ~~LOC~~
30.0000 mL | Freq: Once | ORAL | Status: AC
  Administered 2014-03-16: 30 mL via ORAL

## 2014-03-16 MED ORDER — ONDANSETRON 4 MG PO TBDP
4.0000 mg | ORAL_TABLET | Freq: Once | ORAL | Status: AC
  Administered 2014-03-16: 4 mg via ORAL

## 2014-03-16 MED ORDER — ONDANSETRON 4 MG PO TBDP
ORAL_TABLET | ORAL | Status: AC
  Filled 2014-03-16: qty 1

## 2014-03-16 MED ORDER — GI COCKTAIL ~~LOC~~
ORAL | Status: AC
  Filled 2014-03-16: qty 30

## 2014-03-16 NOTE — ED Notes (Signed)
Pt       Reports  A  History  Of      Anxiety          And       Some  dizzyness        He  Reports    He  Picked  A  Pill  Off the  Floor  After  Cleaning the  Floor  With spic  And  Span  And  After that  He  Felt  Bad    He  Is  Anxious     Otherwise  Displaying  Age  Appropriate  behaviour

## 2014-03-16 NOTE — Discharge Instructions (Signed)
Drink plenty of fluids today, see your doctor as needed.

## 2014-03-16 NOTE — ED Provider Notes (Signed)
CSN: [id number]     Arrival date & time 03/16/14  1421 History   First MD Initiated Contact with Patient 03/16/14 1430     Chief Complaint  Patient presents with  . Dizziness   (Consider location/radiation/quality/duration/timing/severity/associated sxs/prior Treatment) Patient is a 58 y.o. male presenting with dizziness. The history is provided by the patient.  Dizziness Severity:  Mild Onset quality:  Sudden Duration:  1 day Chronicity:  New Context comment:  Brief episode after taking medication that fell on floor after cleaning. has since resolved Associated symptoms: nausea and vomiting   Associated symptoms: no diarrhea and no shortness of breath     Past Medical History  Diagnosis Date  . Hyperlipidemia   . Depression   . Anemia   . Cataract   . Anxiety    Past Surgical History  Procedure Laterality Date  . Cataract extraction     History reviewed. No pertinent family history. History  Substance Use Topics  . Smoking status: Current Every Day Smoker -- 1.00 packs/day for 43 years    Types: Cigarettes  . Smokeless tobacco: Not on file  . Alcohol Use: No    Review of Systems  Constitutional: Negative.   HENT: Negative.   Respiratory: Negative.  Negative for shortness of breath.   Gastrointestinal: Positive for nausea and vomiting. Negative for diarrhea.  Neurological: Positive for dizziness.    Allergies  Review of patient's allergies indicates no known allergies.  Home Medications   Prior to Admission medications   Medication Sig Start Date End Date Taking? Authorizing Provider  ALPRAZolam Duanne Moron) 0.5 MG tablet Take maximum of one pill 3 times daily for anxiety 12/21/13   Posey Boyer, MD  fluvoxaMINE (LUVOX) 100 MG tablet Take 100 mg by mouth at bedtime.    Historical Provider, MD  hydrOXYzine (VISTARIL) 25 MG capsule Take 1 capsule (25 mg total) by mouth 3 (three) times daily as needed for itching. 03/02/13   Darlyne Russian, MD  QUEtiapine (SEROQUEL)  100 MG tablet Take 100 mg by mouth at bedtime.    Historical Provider, MD   BP 103/78  Pulse 63  Temp(Src) 98 F (36.7 C) (Oral)  Resp 18  SpO2 100% Physical Exam  Nursing note and vitals reviewed. Constitutional: He is oriented to person, place, and time. He appears well-developed and well-nourished.  HENT:  Mouth/Throat: Oropharynx is clear and moist.  Neck: Normal range of motion. Neck supple. No thyromegaly present.  Cardiovascular: Normal heart sounds.   Pulmonary/Chest: Effort normal and breath sounds normal.  Abdominal: Soft. Bowel sounds are normal. He exhibits no distension and no mass. There is no tenderness. There is no rebound.  Lymphadenopathy:    He has no cervical adenopathy.  Neurological: He is alert and oriented to person, place, and time.  Skin: Skin is warm and dry.    ED Course  Procedures (including critical care time) Labs Review Labs Reviewed - No data to display  Imaging Review No results found.   MDM   1. Anxiety about health        Billy Fischer, MD 03/16/14 1444

## 2017-01-23 ENCOUNTER — Emergency Department (HOSPITAL_COMMUNITY): Payer: Medicare Other

## 2017-01-23 ENCOUNTER — Observation Stay (HOSPITAL_COMMUNITY)
Admission: EM | Admit: 2017-01-23 | Discharge: 2017-01-24 | Disposition: A | Discharge status: Home / Self Care | Payer: Medicare Other | Attending: Internal Medicine | Admitting: Internal Medicine

## 2017-01-23 DIAGNOSIS — R079 Chest pain, unspecified: Secondary | ICD-10-CM | POA: Diagnosis present

## 2017-01-23 DIAGNOSIS — F17200 Nicotine dependence, unspecified, uncomplicated: Secondary | ICD-10-CM | POA: Diagnosis present

## 2017-01-23 DIAGNOSIS — F17210 Nicotine dependence, cigarettes, uncomplicated: Secondary | ICD-10-CM | POA: Insufficient documentation

## 2017-01-23 DIAGNOSIS — J410 Simple chronic bronchitis: Secondary | ICD-10-CM | POA: Diagnosis present

## 2017-01-23 DIAGNOSIS — R072 Precordial pain: Principal | ICD-10-CM | POA: Insufficient documentation

## 2017-01-23 DIAGNOSIS — R001 Bradycardia, unspecified: Secondary | ICD-10-CM | POA: Diagnosis not present

## 2017-01-23 DIAGNOSIS — Z7984 Long term (current) use of oral hypoglycemic drugs: Secondary | ICD-10-CM | POA: Diagnosis not present

## 2017-01-23 DIAGNOSIS — R0602 Shortness of breath: Secondary | ICD-10-CM | POA: Diagnosis not present

## 2017-01-23 DIAGNOSIS — F1920 Other psychoactive substance dependence, uncomplicated: Secondary | ICD-10-CM | POA: Diagnosis present

## 2017-01-23 DIAGNOSIS — Z79899 Other long term (current) drug therapy: Secondary | ICD-10-CM | POA: Insufficient documentation

## 2017-01-23 LAB — COMPREHENSIVE METABOLIC PANEL
ALT: 22 U/L (ref 17–63)
AST: 23 U/L (ref 15–41)
Albumin: 4 g/dL (ref 3.5–5.0)
Alkaline Phosphatase: 67 U/L (ref 38–126)
Anion gap: 8 (ref 5–15)
BUN: 18 mg/dL (ref 6–20)
CHLORIDE: 103 mmol/L (ref 101–111)
CO2: 30 mmol/L (ref 22–32)
CREATININE: 1.15 mg/dL (ref 0.61–1.24)
Calcium: 9.2 mg/dL (ref 8.9–10.3)
GFR calc Af Amer: >60 mL/min (ref >60)
Glucose, Bld: 161 mg/dL — ABNORMAL HIGH (ref 65–99)
Potassium: 4.1 mmol/L (ref 3.5–5.1)
Sodium: 141 mmol/L (ref 135–145)
Total Bilirubin: 0.3 mg/dL (ref 0.3–1.2)
Total Protein: 6.8 g/dL (ref 6.5–8.1)

## 2017-01-23 LAB — CBC WITH DIFFERENTIAL/PLATELET
BASOS PCT: 0 %
Basophils Absolute: 0 10*3/uL (ref 0.0–0.1)
Eosinophils Absolute: 0.1 10*3/uL (ref 0.0–0.7)
Eosinophils Relative: 1 %
HCT: 40.5 % (ref 39.0–52.0)
Hemoglobin: 14 g/dL (ref 13.0–17.0)
Lymphocytes Relative: 37 %
Lymphs Abs: 2.7 10*3/uL (ref 0.7–4.0)
MCH: 31.8 pg (ref 26.0–34.0)
MCHC: 34.6 g/dL (ref 30.0–36.0)
MCV: 92 fL (ref 78.0–100.0)
MONOS PCT: 5 %
Monocytes Absolute: 0.4 10*3/uL (ref 0.1–1.0)
Neutro Abs: 4.1 10*3/uL (ref 1.7–7.7)
Neutrophils Relative %: 57 %
Platelets: 204 10*3/uL (ref 150–400)
RBC: 4.4 MIL/uL (ref 4.22–5.81)
RDW: 12.5 % (ref 11.5–15.5)
WBC: 7.2 10*3/uL (ref 4.0–10.5)

## 2017-01-23 LAB — TROPONIN I: Troponin I: <0.03 ng/mL (ref <0.03)

## 2017-01-23 LAB — BRAIN NATRIURETIC PEPTIDE: B Natriuretic Peptide: 29.3 pg/mL (ref 0.0–100.0)

## 2017-01-23 LAB — I-STAT TROPONIN, ED: TROPONIN I, POC: 0 ng/mL (ref 0.00–0.08)

## 2017-01-23 LAB — I-STAT CG4 LACTIC ACID, ED: LACTIC ACID, VENOUS: 1.31 mmol/L (ref 0.5–1.9)

## 2017-01-23 LAB — LIPASE, BLOOD: Lipase: 29 U/L (ref 11–51)

## 2017-01-23 LAB — ETHANOL: Alcohol, Ethyl (B): <5 mg/dL (ref <5)

## 2017-01-23 MED ORDER — HYDROXYZINE HCL 25 MG PO TABS: 25.0000 mg | ORAL_TABLET | Freq: Three times a day (TID) | ORAL | Status: DC | PRN

## 2017-01-23 MED ORDER — QUETIAPINE FUMARATE 100 MG PO TABS
100.0000 mg | ORAL_TABLET | Freq: Every day | ORAL | Status: DC
  Administered 2017-01-24: 100 mg via ORAL
  Filled 2017-01-23: qty 1

## 2017-01-23 MED ORDER — ACETAMINOPHEN 500 MG PO TABS
1000.0000 mg | ORAL_TABLET | Freq: Once | ORAL | Status: AC
  Administered 2017-01-23: 1000 mg via ORAL
  Filled 2017-01-23: qty 2

## 2017-01-23 MED ORDER — ALPRAZOLAM 0.5 MG PO TABS
0.5000 mg | ORAL_TABLET | Freq: Four times a day (QID) | ORAL | Status: DC
  Administered 2017-01-24 (×2): 0.5 mg via ORAL
  Filled 2017-01-23 (×2): qty 1

## 2017-01-23 MED ORDER — ENOXAPARIN SODIUM 40 MG/0.4ML ~~LOC~~ SOLN
40.0000 mg | Freq: Every day | SUBCUTANEOUS | Status: DC
  Administered 2017-01-24: 40 mg via SUBCUTANEOUS
  Filled 2017-01-23: qty 0.4

## 2017-01-23 MED ORDER — BUPRENORPHINE HCL-NALOXONE HCL 8-2 MG SL SUBL
1.0000 | SUBLINGUAL_TABLET | Freq: Every day | SUBLINGUAL | Status: DC
  Administered 2017-01-24: 1 via SUBLINGUAL
  Filled 2017-01-23: qty 1

## 2017-01-23 MED ORDER — ACETAMINOPHEN 325 MG PO TABS: 650.0000 mg | ORAL_TABLET | ORAL | Status: DC | PRN

## 2017-01-23 MED ORDER — SIMVASTATIN 20 MG PO TABS
20.0000 mg | ORAL_TABLET | Freq: Every day | ORAL | Status: DC
  Administered 2017-01-24: 20 mg via ORAL
  Filled 2017-01-23: qty 1

## 2017-01-23 MED ORDER — FLUVOXAMINE MALEATE 100 MG PO TABS
100.0000 mg | ORAL_TABLET | Freq: Every day | ORAL | Status: DC
  Administered 2017-01-24: 100 mg via ORAL
  Filled 2017-01-23: qty 1

## 2017-01-23 MED ORDER — NITROGLYCERIN 2 % TD OINT
1.0000 [in_us] | TOPICAL_OINTMENT | Freq: Once | TRANSDERMAL | Status: DC
  Filled 2017-01-23: qty 1

## 2017-01-23 MED ORDER — ONDANSETRON HCL 4 MG/2ML IJ SOLN: 4.0000 mg | Freq: Four times a day (QID) | INTRAMUSCULAR | Status: DC | PRN

## 2017-01-23 MED ORDER — NITROGLYCERIN 0.4 MG SL SUBL: 0.4000 mg | SUBLINGUAL_TABLET | SUBLINGUAL | Status: DC | PRN

## 2017-01-23 NOTE — ED Provider Notes (Signed)
[facility] DEPT Provider Note   CSN: [id number] Arrival date & time: 01/23/17  2015     History   Chief Complaint Chief Complaint  Patient presents with  . Chest Pain    HPI Jeremy Shannon is a 61 y.o. male.  The patient is a poor historian. He reports that earlier today, he had onset of substernal chest pain. Also had associated shortness of breath, nausea, vomiting, diaphoresis. Endorses some mild pain in his back as well. EMS was called. They gave the patient ASA 324 and nitroglycerin with some mild improvement in pain. Vital signs otherwise normal and stable with them in route.   The history is provided by the patient and the EMS personnel.  Illness  This is a new problem. The current episode started 1 to 2 hours ago. The problem occurs constantly. The problem has not changed since onset.Associated symptoms include chest pain and shortness of breath. Pertinent negatives include no headaches. Relieved by: nitro.    Past Medical History:  Diagnosis Date  . Anemia   . Anxiety   . Cataract   . Depression   . Hyperlipidemia     Patient Active Problem List   Diagnosis Date Noted  . Chest pain, rule out acute myocardial infarction 01/23/2017  . Bradycardia with 41-50 beats per minute 01/23/2017  . Polysubstance dependence ([place]) 12/13/2012  . ANXIETY 12/17/2007  . TOBACCO ABUSE 12/17/2007  . DEPRESSION 12/17/2007  . COLONIC POLYPS 05/07/2007  . DIVERTICULOSIS, COLON 05/07/2007    Past Surgical History:  Procedure Laterality Date  . CATARACT EXTRACTION         Home Medications    Prior to Admission medications   Medication Sig Start Date End Date Taking? Authorizing Provider  ALPRAZolam Duanne Moron) 0.5 MG tablet Take maximum of one pill 3 times daily for anxiety Patient taking differently: Take 0.5 mg by mouth 4 (four) times daily.  12/21/13  Yes Posey Boyer, MD  buprenorphine-naloxone (SUBOXONE) 8-2 mg SUBL SL tablet Place 1 tablet under the tongue  daily.   Yes [provider]  fluvoxaMINE (LUVOX) 100 MG tablet Take 100 mg by mouth at bedtime.   Yes [provider]  hydrOXYzine (VISTARIL) 25 MG capsule Take 1 capsule (25 mg total) by mouth 3 (three) times daily as needed for itching. 03/02/13  Yes Daub, Loura Back, MD  metFORMIN (GLUCOPHAGE) 500 MG tablet Take 500 mg by mouth 2 (two) times daily with a meal.    Yes [provider]  QUEtiapine (SEROQUEL) 100 MG tablet Take 100 mg by mouth at bedtime.   Yes [provider]  simvastatin (ZOCOR) 20 MG tablet Take 20 mg by mouth daily.   Yes [provider]    Family History No family history on file.  Social History Social History  Substance Use Topics  . Smoking status: Current Every Day Smoker    Packs/day: 1.00    Years: 43.00    Types: Cigarettes  . Smokeless tobacco: Not on file  . Alcohol use No     Allergies   Patient has no known allergies.   Review of Systems Review of Systems  Constitutional: Positive for diaphoresis and fatigue.  Respiratory: Positive for shortness of breath.   Cardiovascular: Positive for chest pain.  Gastrointestinal: Positive for nausea and vomiting. Negative for blood in stool and diarrhea.  Musculoskeletal: Positive for back pain.  Neurological: Negative for light-headedness and headaches.  All other systems reviewed and are negative.    Physical  Exam Updated Vital Signs BP (!) 112/97   Pulse (!) 57   Temp 97.5 F (36.4 C) (Oral)   Resp 11   SpO2 94%   Physical Exam  Constitutional: He appears well-developed and well-nourished.  Appears uncomfortable.  HENT:  Head: Normocephalic and atraumatic.  Mouth/Throat: Mucous membranes are dry.  Eyes: Conjunctivae are normal.  Neck: Neck supple.  Cardiovascular: Normal rate and regular rhythm.   No murmur heard. Palpable bilateral radial pulses  Pulmonary/Chest: Effort normal and breath sounds normal. No respiratory distress. He has no  wheezes. He has no rhonchi.  Abdominal: Soft. There is no tenderness.  Musculoskeletal: He exhibits no edema.  Neurological: He is alert.  No neuro deficits  Skin: Skin is warm and dry.  Psychiatric: He has a normal mood and affect.  Nursing note and vitals reviewed.    ED Treatments / Results  Labs (all labs ordered are listed, but only abnormal results are displayed) Labs Reviewed  COMPREHENSIVE METABOLIC PANEL - Abnormal; Notable for the following:       Result Value   Glucose, Bld 161 (*)    All other components within normal limits  ETHANOL  LIPASE, BLOOD  BRAIN NATRIURETIC PEPTIDE  CBC WITH DIFFERENTIAL/PLATELET  TROPONIN I  HIV ANTIBODY (ROUTINE TESTING)  TROPONIN I  TROPONIN I  RAPID URINE DRUG SCREEN, HOSP PERFORMED  I-STAT TROPOININ, ED  I-STAT CG4 LACTIC ACID, ED    EKG  EKG Interpretation  Date/Time:  Monday Jan 23 2017 20:15:52 EDT Ventricular Rate:  59 PR Interval:    QRS Duration: 103 QT Interval:  466 QTC Calculation: 462 R Axis:   44 Text Interpretation:  Unknown rhythm, irregular rate Low voltage, precordial leads RSR' in V1 or V2, right VCD or RVH T wave abnormality Abnormal ekg Confirmed by Carmin Muskrat [phone]) on 01/23/2017 8:18:54 PM       Radiology Dg Chest 2 View  Result Date: 01/23/2017 CLINICAL DATA:  CP/SOB since 6pm today. Hx of diabetes. Smoker. EXAM: CHEST  2 VIEW COMPARISON:  01/23/2017, 03/17/2011, and 12/15/2005 FINDINGS: Heart size is normal. Aorta is mildly tortuous. The lungs are free of focal consolidations and pleural effusions. The appearance of the right lung base is improved, likely related to improved lung inflation and technique. No pulmonary edema. There is mild mid thoracic spondylosis. IMPRESSION: No active cardiopulmonary disease. Electronically Signed   By: Nolon Nations M.D.   On: 01/23/2017 21:58   Dg Chest Portable 1 View  Result Date: 01/23/2017 CLINICAL DATA:  Dyspnea EXAM: PORTABLE CHEST 1 VIEW  COMPARISON:  03/17/2011 FINDINGS: Subtle right lower lobe medial consolidation with air bronchograms cannot exclude a pneumonia. No overt pulmonary edema, effusion or pneumothorax. Heart is top-normal in size. There is slight uncoiling of the thoracic aorta without aneurysm. No acute nor suspicious osseous lesions IMPRESSION: Subtle consolidation suggested in the medial right lower lobe with air bronchograms. A pneumonia is not excluded. Otherwise negative exam. Electronically Signed   By: Ashley Royalty M.D.   On: 01/23/2017 20:48    Procedures Procedures (including critical care time)  Medications Ordered in ED Medications  nitroGLYCERIN (NITROSTAT) SL tablet 0.4 mg (not administered)  nitroGLYCERIN (NITROGLYN) 2 % ointment 1 inch (1 inch Topical Refused 01/23/17 2315)  fluvoxaMINE (LUVOX) tablet 100 mg (not administered)  QUEtiapine (SEROQUEL) tablet 100 mg (not administered)  buprenorphine-naloxone (SUBOXONE) 8-2 mg per SL tablet 1 tablet (not administered)  hydrOXYzine (VISTARIL) capsule 25 mg (not administered)  ALPRAZolam (XANAX) tablet 0.5 mg (not  administered)  simvastatin (ZOCOR) tablet 20 mg (not administered)  acetaminophen (TYLENOL) tablet 650 mg (not administered)  ondansetron (ZOFRAN) injection 4 mg (not administered)  enoxaparin (LOVENOX) injection 40 mg (not administered)  acetaminophen (TYLENOL) tablet 1,000 mg (1,000 mg Oral Given 01/23/17 2209)     Initial Impression / Assessment and Plan / ED Course  I have reviewed the triage vital signs and the nursing notes.  Pertinent labs & imaging results that were available during my care of the patient were reviewed by me and considered in my medical decision making (see chart for details).     On arrival here, patient still appears uncomfortable, still complaining of chest pain. Vital show bradycardia. Otherwise normal and stable. Gave admit patient some more nitroglycerin here with improvement in pain. Still mildly symptomatic.  EKG shows no ischemic changes or interval abnormalities. Troponin negative. Patient does have a heart score of 4. No prior or recent cardiac evaluation. He does have multiple risk factors. We'll admit him to the hospital for cardiac evaluation. Chest x-ray without evidence of pneumothorax or pneumonia. Mediastinum is narrow and the description of symptoms does not seem consistent with aortic dissection, especially in the setting of no tachycardia and no hypertension. He has pulses in bilateral upper extremities and he has no neurological deficits. We'll hold off on a dissection study for now. BNP within normal limits. Electrolytes within normal limits. CBC without leukocytosis or anemia. We'll admit him to the hospitalist for ACS rule out.  Final Clinical Impressions(s) / ED Diagnoses   Final diagnoses:  Precordial pain    New Prescriptions New Prescriptions   No medications on file     Maryan Puls, MD 01/23/17 2359    Carmin Muskrat, MD 01/24/17 0003

## 2017-01-23 NOTE — ED Notes (Signed)
Collected blood for istat and istat was collected earlier but not clicked off in epic.,

## 2017-01-23 NOTE — ED Triage Notes (Signed)
Pt arrived via [facility]. Per EMS, pt was c/o n/v and experiencing diaphoresis. Pt also c/o chest pressure and back pain, 7/10. Pt was given 324 ASA and 1 nitro on scene. EMS v/s: bp - 128/81, HR 52, 95% RA, CBG 155. Pt is alert and oriented x4. Pt has hx of DM, anxiety, depression, and smoking. Upon arrival, pt was sinus brady on the monitor.

## 2017-01-23 NOTE — H&P (Signed)
History and Physical    Jeremy Shannon:[id number] DOB: [birth year] DOA: 01/23/2017  PCP: Curly Rim, MD  Patient coming from: Home  I have personally briefly reviewed patient's old medical records in [place]  Chief Complaint: Chest Pain  HPI: Jeremy Shannon is a 61 y.o. male with medical history significant of depression and anxiety.  Patient presents to the ED with c/o R sided chest discomfort.  Symptoms onset 1-2 hours ago.  Associated SOB.  Symptoms improved with NTG.   ED Course: Trop neg.  EKG, no ischemia, but patient in sinus bradycardia with rates in the 40s and got as low as 39 on monitor.   Review of Systems: As per HPI otherwise 10 point review of systems negative.   Past Medical History:  Diagnosis Date  . Anemia   . Anxiety   . Cataract   . Depression   . Hyperlipidemia     Past Surgical History:  Procedure Laterality Date  . CATARACT EXTRACTION       reports that he has been smoking Cigarettes.  He has a 43.00 pack-year smoking history. He does not have any smokeless tobacco history on file. He reports that he does not drink alcohol or use drugs.  No Known Allergies  No family history on file.   Prior to Admission medications   Medication Sig Start Date End Date Taking? Authorizing Provider  ALPRAZolam Duanne Moron) 0.5 MG tablet Take maximum of one pill 3 times daily for anxiety Patient taking differently: Take 0.5 mg by mouth 4 (four) times daily.  12/21/13  Yes Posey Boyer, MD  buprenorphine-naloxone (SUBOXONE) 8-2 mg SUBL SL tablet Place 1 tablet under the tongue daily.   Yes [provider]  fluvoxaMINE (LUVOX) 100 MG tablet Take 100 mg by mouth at bedtime.   Yes [provider]  hydrOXYzine (VISTARIL) 25 MG capsule Take 1 capsule (25 mg total) by mouth 3 (three) times daily as needed for itching. 03/02/13  Yes Daub, Loura Back, MD  metFORMIN (GLUCOPHAGE) 500 MG tablet Take 500 mg by mouth 2 (two) times daily with  a meal.    Yes [provider]  QUEtiapine (SEROQUEL) 100 MG tablet Take 100 mg by mouth at bedtime.   Yes [provider]  simvastatin (ZOCOR) 20 MG tablet Take 20 mg by mouth daily.   Yes [provider]    Physical Exam: Vitals:   01/23/17 2028 01/23/17 2046 01/23/17 2215 01/23/17 2230  BP:   (!) 155/87 (!) 112/97  Pulse: (!) 50  (!) 41 (!) 57  Resp: 19  15 11   Temp:  97.5 F (36.4 C)    TempSrc:  Oral    SpO2: 93%  95% 94%    Constitutional: NAD, calm, comfortable Eyes: PERRL, lids and conjunctivae normal ENMT: Mucous membranes are moist. Posterior pharynx clear of any exudate or lesions.Normal dentition.  Neck: normal, supple, no masses, no thyromegaly Respiratory: clear to auscultation bilaterally, no wheezing, no crackles. Normal respiratory effort. No accessory muscle use.  Cardiovascular: Bradycardic, no murmurs / rubs / gallops. No extremity edema. 2+ pedal pulses. No carotid bruits.  Abdomen: no tenderness, no masses palpated. No hepatosplenomegaly. Bowel sounds positive.  Musculoskeletal: no clubbing / cyanosis. No joint deformity upper and lower extremities. Good ROM, no contractures. Normal muscle tone.  Skin: no rashes, lesions, ulcers. No induration Neurologic: CN 2-12 grossly intact. Sensation intact, DTR normal. Strength 5/5 in all 4.  Psychiatric: Normal judgment and insight. Alert  and oriented x 3. Normal mood.    Labs on Admission: I have personally reviewed following labs and imaging studies  CBC:  Recent Labs Lab 01/23/17 2030  WBC 7.2  NEUTROABS 4.1  HGB 14.0  HCT 40.5  MCV 92.0  PLT 400   Basic Metabolic Panel:  Recent Labs Lab 01/23/17 2030  NA 141  K 4.1  CL 103  CO2 30  GLUCOSE 161*  BUN 18  CREATININE 1.15  CALCIUM 9.2   GFR: CrCl cannot be calculated (Unknown ideal weight.). Liver Function Tests:  Recent Labs Lab 01/23/17 2030  AST 23  ALT 22  ALKPHOS 67  BILITOT 0.3  PROT 6.8  ALBUMIN 4.0      Recent Labs Lab 01/23/17 2030  LIPASE 29   No results for input(s): AMMONIA in the last 168 hours. Coagulation Profile: No results for input(s): INR, PROTIME in the last 168 hours. Cardiac Enzymes: No results for input(s): CKTOTAL, CKMB, CKMBINDEX, TROPONINI in the last 168 hours. BNP (last 3 results) No results for input(s): PROBNP in the last 8760 hours. HbA1C: No results for input(s): HGBA1C in the last 72 hours. CBG: No results for input(s): GLUCAP in the last 168 hours. Lipid Profile: No results for input(s): CHOL, HDL, LDLCALC, TRIG, CHOLHDL, LDLDIRECT in the last 72 hours. Thyroid Function Tests: No results for input(s): TSH, T4TOTAL, FREET4, T3FREE, THYROIDAB in the last 72 hours. Anemia Panel: No results for input(s): VITAMINB12, FOLATE, FERRITIN, TIBC, IRON, RETICCTPCT in the last 72 hours. Urine analysis: No results found for: COLORURINE, APPEARANCEUR, LABSPEC, PHURINE, GLUCOSEU, HGBUR, BILIRUBINUR, KETONESUR, PROTEINUR, UROBILINOGEN, NITRITE, LEUKOCYTESUR  Radiological Exams on Admission: Dg Chest 2 View  Result Date: 01/23/2017 CLINICAL DATA:  CP/SOB since 6pm today. Hx of diabetes. Smoker. EXAM: CHEST  2 VIEW COMPARISON:  01/23/2017, 03/17/2011, and 12/15/2005 FINDINGS: Heart size is normal. Aorta is mildly tortuous. The lungs are free of focal consolidations and pleural effusions. The appearance of the right lung base is improved, likely related to improved lung inflation and technique. No pulmonary edema. There is mild mid thoracic spondylosis. IMPRESSION: No active cardiopulmonary disease. Electronically Signed   By: Nolon Nations M.D.   On: 01/23/2017 21:58   Dg Chest Portable 1 View  Result Date: 01/23/2017 CLINICAL DATA:  Dyspnea EXAM: PORTABLE CHEST 1 VIEW COMPARISON:  03/17/2011 FINDINGS: Subtle right lower lobe medial consolidation with air bronchograms cannot exclude a pneumonia. No overt pulmonary edema, effusion or pneumothorax. Heart is top-normal  in size. There is slight uncoiling of the thoracic aorta without aneurysm. No acute nor suspicious osseous lesions IMPRESSION: Subtle consolidation suggested in the medial right lower lobe with air bronchograms. A pneumonia is not excluded. Otherwise negative exam. Electronically Signed   By: Ashley Royalty M.D.   On: 01/23/2017 20:48    EKG: Independently reviewed.  Assessment/Plan Principal Problem:   Chest pain, rule out acute myocardial infarction Active Problems:   Bradycardia with 41-50 beats per minute    1. CP r/o - 1. CP obs pathway 2. Serial trops 3. Tele monitor 4. NPO after midnight 5. Call cards in AM 2. Sinus bradycardia - 1. Almost seems to be acting like a symptomatic bradycardia / SSS at this point 2. No obvious medications to blame for this 1. Will order UDS, but based on home meds I expect to see opiates and benzos 3. Tele monitor 4. Call cards in AM  DVT prophylaxis: Lovenox Code Status: Full Family Communication: No family in room Disposition Plan: Home after  admit Consults called: None Admission status: Place in obs   GARDNER, [place] [facility] Pager [phone]  If 7AM-7PM, please contact day team taking care of patient [email or web address] Password [email or web address]  01/23/2017, 11:01 PM

## 2017-01-23 NOTE — ED Notes (Signed)
ED Provider at bedside. 

## 2017-01-24 ENCOUNTER — Encounter (HOSPITAL_COMMUNITY): Payer: Self-pay | Admitting: *Deleted

## 2017-01-24 ENCOUNTER — Other Ambulatory Visit: Payer: Self-pay | Admitting: Physician Assistant

## 2017-01-24 DIAGNOSIS — F17200 Nicotine dependence, unspecified, uncomplicated: Secondary | ICD-10-CM

## 2017-01-24 DIAGNOSIS — R0789 Other chest pain: Secondary | ICD-10-CM

## 2017-01-24 DIAGNOSIS — F1920 Other psychoactive substance dependence, uncomplicated: Secondary | ICD-10-CM

## 2017-01-24 DIAGNOSIS — R079 Chest pain, unspecified: Secondary | ICD-10-CM | POA: Diagnosis not present

## 2017-01-24 LAB — TROPONIN I: Troponin I: <0.03 ng/mL (ref <0.03)

## 2017-01-24 LAB — HIV ANTIBODY (ROUTINE TESTING W REFLEX): HIV SCREEN 4TH GENERATION: NONREACTIVE

## 2017-01-24 MED ORDER — PNEUMOCOCCAL VAC POLYVALENT 25 MCG/0.5ML IJ INJ: 0.5000 mL | INJECTION | INTRAMUSCULAR | Status: DC

## 2017-01-24 NOTE — Care Management Note (Signed)
Case Management Note  Patient Details  Name: KYRI DAI MRN: [id number] Date of Birth: 1955-10-13  Subjective/Objective:    CP, sinus bradycardia                Action/Plan: Discharge Planning: NCM spoke to pt and takes care of mother at home. States he was independent prior to hospital stay. Drives to his appts.  PCP Ledora Bottcher A    Expected Discharge Date:  01/24/17               Expected Discharge Plan:  Home/Self Care  In-House Referral:  NA  Discharge planning Services  CM Consult  Post Acute Care Choice:  NA Choice offered to:  NA  DME Arranged:  N/A DME Agency:  NA  HH Arranged:  NA HH Agency:  NA  Status of Service:  Completed, signed off  If discussed at [place] of Stay Meetings, dates discussed:    Additional Comments:  Erenest Rasher, RN 01/24/2017, 11:31 AM

## 2017-01-24 NOTE — Care Management Obs Status (Signed)
[place] NOTIFICATION   Patient Details  Name: Jeremy Shannon MRN: [id number] Date of Birth: 1956-06-07   Medicare Observation Status Notification Given:  Yes    Erenest Rasher, RN 01/24/2017, 11:24 AM

## 2017-01-24 NOTE — Consult Note (Signed)
Cardiology Consultation:   Patient ID: Jeremy Shannon; [id number]; [birth year]   Admit date: 01/23/2017 Date of Consult: 01/24/2017  Primary Care Provider: Curly Rim, MD Primary Cardiologist: New to Dr. Sallyanne Kuster  Patient Profile:   Jeremy Shannon is a 61 y.o. male with a hx of HLD, anxiety, tobacco smoking and depression who is being seen today for the evaluation of chest pain at the request of Dr. Eliseo Squires.   History of Present Illness:   Jeremy Shannon is a poor historian. Yesterday afternoon patient had a sudden onset right-sided chest pain associated with shortness of breath and diaphoresis. He also had back pain. Questionable radiation. Symptoms lasted for 1-2 hours and EMS was called. Symptoms improved with nitroglycerin x 1 per note. Patient noted to be bradycardic in the 40s to 50s.  EKG shows normal sinus rhythm at rate of 59 minute. Telemetry shows sinus rhythm at rate of 40s and PACs. Troponin x 3 negative. BNP 29. Electrolytes and kidney function normal. Normal alcohol level. Chest x-ray without acute finding. Pending urine drug test. No recurrent chest pain.   He smokes one pack a day for the past 40-50 years. No family hx of CAD.   Past Medical History:  Diagnosis Date  . Anemia   . Anxiety   . Cataract   . Depression   . Hyperlipidemia     Past Surgical History:  Procedure Laterality Date  . CATARACT EXTRACTION       Inpatient Medications: Scheduled Meds: . ALPRAZolam  0.5 mg Oral QID  . buprenorphine-naloxone  1 tablet Sublingual Daily  . enoxaparin (LOVENOX) injection  40 mg Subcutaneous Daily  . fluvoxaMINE  100 mg Oral QHS  . nitroGLYCERIN  1 inch Topical Once  . [START ON 01/25/2017] pneumococcal 23 valent vaccine  0.5 mL Intramuscular Tomorrow-1000  . QUEtiapine  100 mg Oral QHS  . simvastatin  20 mg Oral Daily   Continuous Infusions:  PRN Meds: acetaminophen, hydrOXYzine, nitroGLYCERIN, ondansetron (ZOFRAN) IV  Allergies:   No Known  Allergies  Social History:   Social History   Social History  . Marital status: Legally Separated    Spouse name: N/A  . Number of children: N/A  . Years of education: N/A   Occupational History  . Not on file.   Social History Main Topics  . Smoking status: Current Every Day Smoker    Packs/day: 1.00    Years: 43.00    Types: Cigarettes  . Smokeless tobacco: Never Used  . Alcohol use No  . Drug use: No  . Sexual activity: No   Other Topics Concern  . Not on file   Social History Narrative  . No narrative on file    Family History:   The patient's family history is not on file.  ROS:  Please see the history of present illness.  All other ROS reviewed and negative.     Physical Exam/Data:   Vitals:   01/23/17 2359 01/24/17 0026 01/24/17 0226 01/24/17 0425  BP: 134/67 109/68 (!) 107/58 109/79  Pulse: 67     Resp: 12     Temp: 98 F (36.7 C)     TempSrc: Oral     SpO2: 100%     Weight: 218 lb 4.8 oz (99 kg)     Height: 5\' 9"  (1.753 m)      No intake or output data in the 24 hours ending 01/24/17 0739 Filed Weights   01/23/17 2359  Weight: 218  lb 4.8 oz (99 kg)   Body mass index is 32.24 kg/m.  General:  Well nourished, well developed, in no acute distress. HEENT: normal Lymph: no adenopathy Neck: no JVD Endocrine:  No thryomegaly Vascular: No carotid bruits; FA pulses 2+ bilaterally without bruits  Cardiac:  normal S1, S2; RRR; no murmur  Lungs:  clear to auscultation bilaterally, no wheezing, rhonchi or rales  Abd: soft, nontender, no hepatomegaly  Ext: no edema Musculoskeletal:  No deformities, BUE and BLE strength normal and equal Skin: warm and dry  Neuro:  CNs 2-12 intact, no focal abnormalities noted Psych:  Normal affect     Laboratory Data:  Chemistry Recent Labs Lab 01/23/17 2030  NA 141  K 4.1  CL 103  CO2 30  GLUCOSE 161*  BUN 18  CREATININE 1.15  CALCIUM 9.2  GFRNONAA >60  GFRAA >60  ANIONGAP 8     Recent Labs Lab  01/23/17 2030  PROT 6.8  ALBUMIN 4.0  AST 23  ALT 22  ALKPHOS 67  BILITOT 0.3   Hematology Recent Labs Lab 01/23/17 2030  WBC 7.2  RBC 4.40  HGB 14.0  HCT 40.5  MCV 92.0  MCH 31.8  MCHC 34.6  RDW 12.5  PLT 204   Cardiac Enzymes Recent Labs Lab 01/23/17 2317 01/24/17 0142 01/24/17 0505  TROPONINI <0.03 <0.03 <0.03    Recent Labs Lab 01/23/17 2036  TROPIPOC 0.00    BNP Recent Labs Lab 01/23/17 2030  BNP 29.3    DDimer No results for input(s): DDIMER in the last 168 hours.  Radiology/Studies:  Dg Chest 2 View  Result Date: 01/23/2017 CLINICAL DATA:  CP/SOB since 6pm today. Hx of diabetes. Smoker. EXAM: CHEST  2 VIEW COMPARISON:  01/23/2017, 03/17/2011, and 12/15/2005 FINDINGS: Heart size is normal. Aorta is mildly tortuous. The lungs are free of focal consolidations and pleural effusions. The appearance of the right lung base is improved, likely related to improved lung inflation and technique. No pulmonary edema. There is mild mid thoracic spondylosis. IMPRESSION: No active cardiopulmonary disease. Electronically Signed   By: Nolon Nations M.D.   On: 01/23/2017 21:58   Dg Chest Portable 1 View  Result Date: 01/23/2017 CLINICAL DATA:  Dyspnea EXAM: PORTABLE CHEST 1 VIEW COMPARISON:  03/17/2011 FINDINGS: Subtle right lower lobe medial consolidation with air bronchograms cannot exclude a pneumonia. No overt pulmonary edema, effusion or pneumothorax. Heart is top-normal in size. There is slight uncoiling of the thoracic aorta without aneurysm. No acute nor suspicious osseous lesions IMPRESSION: Subtle consolidation suggested in the medial right lower lobe with air bronchograms. A pneumonia is not excluded. Otherwise negative exam. Electronically Signed   By: Ashley Royalty M.D.   On: 01/23/2017 20:48    Assessment and Plan:   1. Chest pain - Difficult historian. He is ruled out. Troponin negative. EKG without acute ischemic changes. ? Back pain is separate or  radiated. Less likely dissection given lack of tachycardia. No recurrent chest pain. Pending UDS. Keep NPO until seen by MD.  2. Sinus bradycardia - Rate in 40-50s. No arrhythmia on telemetry. Avoid AV blocking agent. No further testing planned.   3. Tobacco smoking - advised cessation. Education given.  4. HLD -continue statin.    Jarrett Soho, PA  01/24/2017 7:39 AM   I have seen and examined the patient along with Bhagat,Bhavinkumar, PA.  I have reviewed the chart, notes and new data.  I agree with PA's note.  Key new complaints: no further chest  pain, stomach "upset" Key examination changes: normal CV exam Key new findings / data: normal ECG and enzymes and CXR  PLAN: Chest pain is most likely non-cardiac. Further outpatient evaluation is appropriate, in view of numerous risk factors. Will schedule for treadmill stress test.  Sanda Klein, MD, [place] [phone] 01/24/2017, 8:34 AM

## 2017-01-24 NOTE — Discharge Summary (Signed)
Physician Discharge Summary  Jeremy Shannon OXB:[id number] DOB: [birth year] DOA: 01/23/2017  PCP: Curly Rim, MD  Admit date: 01/23/2017 Discharge date: 01/24/2017   Recommendations for Outpatient Follow-Up:   1. Outpatient stress test   Discharge Diagnosis:   Principal Problem:   Chest pain, rule out acute myocardial infarction Active Problems:   TOBACCO ABUSE   Polysubstance dependence [facility])   Discharge disposition:  Home  Discharge Condition: Improved.  Diet recommendation: Low sodium, heart healthy  Wound care: None.   History of Present Illness:   Jeremy Shannon is a 61 y.o. male with medical history significant of depression and anxiety.  Patient presents to the ED with c/o R sided chest discomfort.  Symptoms onset 1-2 hours ago.  Associated SOB.  Symptoms improved with NTG.   Hospital Course by Problem:   Chest pain-- non cardiac -never able to provide urine for UDS -CE negative -outpatient stress test  Tobacco abuse -encourage cessation     Medical Consultants:    cards   Discharge Exam:   Vitals:   01/24/17 0738 01/24/17 0805  BP: (!) 120/99   Pulse:    Resp:    Temp:  98.7 F (37.1 C)   Vitals:   01/24/17 0226 01/24/17 0425 01/24/17 0738 01/24/17 0805  BP: (!) 107/58 109/79 (!) 120/99   Pulse:      Resp:      Temp:    98.7 F (37.1 C)  TempSrc:    Oral  SpO2:    96%  Weight:      Height:        Gen:  NAD   The results of significant diagnostics from this hospitalization (including imaging, microbiology, ancillary and laboratory) are listed below for reference.     Procedures and Diagnostic Studies:   Dg Chest 2 View  Result Date: 01/23/2017 CLINICAL DATA:  CP/SOB since 6pm today. Hx of diabetes. Smoker. EXAM: CHEST  2 VIEW COMPARISON:  01/23/2017, 03/17/2011, and 12/15/2005 FINDINGS: Heart size is normal. Aorta is mildly tortuous. The lungs are free of focal consolidations and pleural effusions. The  appearance of the right lung base is improved, likely related to improved lung inflation and technique. No pulmonary edema. There is mild mid thoracic spondylosis. IMPRESSION: No active cardiopulmonary disease. Electronically Signed   By: Nolon Nations M.D.   On: 01/23/2017 21:58   Dg Chest Portable 1 View  Result Date: 01/23/2017 CLINICAL DATA:  Dyspnea EXAM: PORTABLE CHEST 1 VIEW COMPARISON:  03/17/2011 FINDINGS: Subtle right lower lobe medial consolidation with air bronchograms cannot exclude a pneumonia. No overt pulmonary edema, effusion or pneumothorax. Heart is top-normal in size. There is slight uncoiling of the thoracic aorta without aneurysm. No acute nor suspicious osseous lesions IMPRESSION: Subtle consolidation suggested in the medial right lower lobe with air bronchograms. A pneumonia is not excluded. Otherwise negative exam. Electronically Signed   By: Ashley Royalty M.D.   On: 01/23/2017 20:48     Labs:   Basic Metabolic Panel:  Recent Labs Lab 01/23/17 2030  NA 141  K 4.1  CL 103  CO2 30  GLUCOSE 161*  BUN 18  CREATININE 1.15  CALCIUM 9.2   GFR Estimated Creatinine Clearance: 76.3 mL/min (by C-G formula based on SCr of 1.15 mg/dL). Liver Function Tests:  Recent Labs Lab 01/23/17 2030  AST 23  ALT 22  ALKPHOS 67  BILITOT 0.3  PROT 6.8  ALBUMIN 4.0    Recent Labs Lab 01/23/17 2030  LIPASE 29   No results for input(s): AMMONIA in the last 168 hours. Coagulation profile No results for input(s): INR, PROTIME in the last 168 hours.  CBC:  Recent Labs Lab 01/23/17 2030  WBC 7.2  NEUTROABS 4.1  HGB 14.0  HCT 40.5  MCV 92.0  PLT 204   Cardiac Enzymes:  Recent Labs Lab 01/23/17 2317 01/24/17 0142 01/24/17 0505  TROPONINI <0.03 <0.03 <0.03   BNP: Invalid input(s): POCBNP CBG: No results for input(s): GLUCAP in the last 168 hours. D-Dimer No results for input(s): DDIMER in the last 72 hours. Hgb A1c No results for input(s): HGBA1C in  the last 72 hours. Lipid Profile No results for input(s): CHOL, HDL, LDLCALC, TRIG, CHOLHDL, LDLDIRECT in the last 72 hours. Thyroid function studies No results for input(s): TSH, T4TOTAL, T3FREE, THYROIDAB in the last 72 hours.  Invalid input(s): FREET3 Anemia work up No results for input(s): VITAMINB12, FOLATE, FERRITIN, TIBC, IRON, RETICCTPCT in the last 72 hours. Microbiology No results found for this or any previous visit (from the past 240 hour(s)).   Discharge Instructions:   Discharge Instructions    Diet - low sodium heart healthy    Complete by:  As directed    Discharge instructions    Complete by:  As directed    Smoking cessation   Increase activity slowly    Complete by:  As directed      Allergies as of 01/24/2017   No Known Allergies     Medication List    TAKE these medications   ALPRAZolam 0.5 MG tablet Commonly known as:  XANAX Take maximum of one pill 3 times daily for anxiety What changed:  how much to take  how to take this  when to take this  additional instructions   buprenorphine-naloxone 8-2 mg Subl SL tablet Commonly known as:  SUBOXONE Place 1 tablet under the tongue daily.   fluvoxaMINE 100 MG tablet Commonly known as:  LUVOX Take 100 mg by mouth at bedtime.   hydrOXYzine 25 MG capsule Commonly known as:  VISTARIL Take 1 capsule (25 mg total) by mouth 3 (three) times daily as needed for itching.   metFORMIN 500 MG tablet Commonly known as:  GLUCOPHAGE Take 500 mg by mouth 2 (two) times daily with a meal.   QUEtiapine 100 MG tablet Commonly known as:  SEROQUEL Take 100 mg by mouth at bedtime.   simvastatin 20 MG tablet Commonly known as:  ZOCOR Take 20 mg by mouth daily.      Follow-up Information    [facility]. Go on 01/27/2017.   Specialty:  Cardiology Why:  for exercise stress test @ 8am. please arrive 15 minutes early Contact information: [address] [place]  [place], [place], [place]. Go on 02/02/2017.   Specialties:  Cardiology, Radiology Why:  @8am  for hosptial cardiology follow up Contact information: [place] [address] [phone]        Corrington, Kip A, MD Follow up in 1 week(s).   Specialty:  Family Medicine Contact information: [place] [address] [place] [address] [phone]            Time coordinating discharge: 25 min  Signed:  Geradine Girt   [facility] 01/24/2017, 3:11 PM

## 2017-01-24 NOTE — Progress Notes (Signed)
Discharged to home. Left unit via wheelchair, accompanied by a relative and transportation technician. Verbal consent obtained from patient to discuss discharge instructions with cousin, Carlota Raspberry. Both verbalized understanding of written discharge instructions. Written instructions for smoking cessation and chest pain given.

## 2017-01-25 ENCOUNTER — Telehealth (HOSPITAL_COMMUNITY): Payer: Self-pay | Admitting: Physician Assistant

## 2017-01-25 ENCOUNTER — Telehealth (HOSPITAL_COMMUNITY): Payer: Self-pay

## 2017-01-25 NOTE — Telephone Encounter (Signed)
I sent a message to [place] in regards to changing an Exercise Myoview order to an ETT order that Dr. Sallyanne Kuster was requesting. Upon speaking with Anderson Malta and Vin(PA) : he stating that the test was to stay as an Exercise Myoview per Dr. Sallyanne Kuster. This consult note was completed on 5/15 per Dr. Sallyanne Kuster.   PLAN: Chest pain is most likely non-cardiac. Further outpatient evaluation is appropriate, in view of numerous risk factors. Will schedule for treadmill stress test.

## 2017-01-25 NOTE — Telephone Encounter (Signed)
Encounter complete. 

## 2017-01-27 ENCOUNTER — Inpatient Hospital Stay (HOSPITAL_COMMUNITY): Admit: 2017-01-27 | Payer: Medicare Other

## 2017-01-27 ENCOUNTER — Telehealth (HOSPITAL_COMMUNITY): Payer: Self-pay

## 2017-01-27 NOTE — Telephone Encounter (Signed)
Encounter complete. 

## 2017-02-01 ENCOUNTER — Ambulatory Visit (HOSPITAL_COMMUNITY)
Admission: RE | Admit: 2017-02-01 | Discharge: 2017-02-01 | Disposition: A | Discharge status: Home / Self Care | Payer: Medicare Other | Source: Ambulatory Visit | Attending: Cardiology | Admitting: Cardiology

## 2017-02-01 DIAGNOSIS — R0789 Other chest pain: Secondary | ICD-10-CM | POA: Diagnosis present

## 2017-02-01 LAB — MYOCARDIAL PERFUSION IMAGING
CHL CUP NUCLEAR SDS: 2
CHL CUP NUCLEAR SSS: 2
LV sys vol: 47 mL
LVDIAVOL: 100 mL (ref 62–150)
NUC STRESS TID: 1.23
Peak HR: 68 {beats}/min
Rest HR: 59 {beats}/min
SRS: 0

## 2017-02-01 MED ORDER — REGADENOSON 0.4 MG/5ML IV SOLN
0.4000 mg | Freq: Once | INTRAVENOUS | Status: AC
  Administered 2017-02-01: 0.4 mg via INTRAVENOUS

## 2017-02-01 MED ORDER — TECHNETIUM TC 99M TETROFOSMIN IV KIT
10.2000 | PACK | Freq: Once | INTRAVENOUS | Status: AC | PRN
  Administered 2017-02-01: 10.2 via INTRAVENOUS
  Filled 2017-02-01: qty 11

## 2017-02-01 MED ORDER — AMINOPHYLLINE 25 MG/ML IV SOLN
100.0000 mg | Freq: Once | INTRAVENOUS | Status: AC
  Administered 2017-02-01: 100 mg via INTRAVENOUS

## 2017-02-01 MED ORDER — TECHNETIUM TC 99M TETROFOSMIN IV KIT
29.5000 | PACK | Freq: Once | INTRAVENOUS | Status: AC | PRN
  Administered 2017-02-01: 29.5 via INTRAVENOUS
  Filled 2017-02-01: qty 30

## 2017-02-02 ENCOUNTER — Ambulatory Visit: Payer: Medicare Other | Admitting: Physician Assistant

## 2017-02-02 NOTE — Progress Notes (Deleted)
Cardiology Office Note   Date:  02/02/2017   ID:  Jeremy Shannon, DOB 1956/03/06, MRN [id number]  PCP:  Curly Rim, MD  Cardiologist:  Dr Sallyanne Kuster, in hospital 01/24/2017  Kazi Reppond, Suanne Marker, PA-C   No chief complaint on file.   History of Present Illness: Jeremy Shannon is a 61 y.o. male with a history of  HLD, anxiety, tobacco smoking and depression   Admit 05/14-05/15/2018 for CP>>outpt MV performed 05/23  [name] Human presents for ***   Past Medical History:  Diagnosis Date  . Anemia   . Anxiety   . Cataract   . Depression   . Hyperlipidemia     Past Surgical History:  Procedure Laterality Date  . CATARACT EXTRACTION      Current Outpatient Prescriptions  Medication Sig Dispense Refill  . ALPRAZolam (XANAX) 0.5 MG tablet Take maximum of one pill 3 times daily for anxiety (Patient taking differently: Take 0.5 mg by mouth 4 (four) times daily. ) 90 tablet 3  . buprenorphine-naloxone (SUBOXONE) 8-2 mg SUBL SL tablet Place 1 tablet under the tongue daily.    . fluvoxaMINE (LUVOX) 100 MG tablet Take 100 mg by mouth at bedtime.    . hydrOXYzine (VISTARIL) 25 MG capsule Take 1 capsule (25 mg total) by mouth 3 (three) times daily as needed for itching. 30 capsule 0  . metFORMIN (GLUCOPHAGE) 500 MG tablet Take 500 mg by mouth 2 (two) times daily with a meal.     . QUEtiapine (SEROQUEL) 100 MG tablet Take 100 mg by mouth at bedtime.    . simvastatin (ZOCOR) 20 MG tablet Take 20 mg by mouth daily.     No current facility-administered medications for this visit.     Allergies:   Patient has no known allergies.    Social History:  The patient  reports that he has been smoking Cigarettes.  He has a 43.00 pack-year smoking history. He has never used smokeless tobacco. He reports that he does not drink alcohol or use drugs.   Family History:  The patient's family history is not on file.    ROS:  Please see the history of present illness. All other  systems are reviewed and negative.    PHYSICAL EXAM: VS:  There were no vitals taken for this visit. , BMI There is no height or weight on file to calculate BMI. GEN: Well nourished, well developed, male in no acute distress  HEENT: normal for age  Neck: no JVD, no carotid bruit, no masses Cardiac: RRR; no murmur, no rubs, or gallops Respiratory:  clear to auscultation bilaterally, normal work of breathing GI: soft, nontender, nondistended, + BS MS: no deformity or atrophy; no edema; distal pulses are 2+ in all 4 extremities   Skin: warm and dry, no rash Neuro:  Strength and sensation are intact Psych: euthymic mood, full affect   EKG:  EKG {ACTION; IS/IS WNU:[id number]} ordered today. The ekg ordered today demonstrates ***  MYOVIEW: 02/01/2017  The left ventricular ejection fraction is mildly decreased (45-54%).  Nuclear stress EF: 53%.  There was no ST segment deviation noted during stress.  No T wave inversion was noted during stress.  The study is normal.  This is a low risk study.  Recent Labs: 01/23/2017: ALT 22; B Natriuretic Peptide 29.3; BUN 18; Creatinine, Ser 1.15; Hemoglobin 14.0; Platelets 204; Potassium 4.1; Sodium 141    Lipid Panel No results found for: CHOL, TRIG, HDL, CHOLHDL, VLDL, LDLCALC, LDLDIRECT  Wt Readings from Last 3 Encounters:  02/01/17 218 lb (98.9 kg)  01/23/17 218 lb 4.8 oz (99 kg)  12/21/13 204 lb (92.5 kg)     Other studies Reviewed: Additional studies/ records that were reviewed today include: ***.  ASSESSMENT AND PLAN:  1.  ***   Current medicines are reviewed at length with the patient today.  The patient {ACTIONS; HAS/DOES NOT HAVE:19233} concerns regarding medicines.  The following changes have been made:  {PLAN; NO CHANGE:13088:s}  Labs/ tests ordered today include: *** No orders of the defined types were placed in this encounter.    Disposition:   FU with ***  Signed, Lenoard Aden  02/02/2017 7:56 AM      [facility] Phone: [phone]; Fax: [phone]  This note was written with the assistance of speech recognition software. Please excuse any transcriptional errors.

## 2017-02-04 ENCOUNTER — Encounter (HOSPITAL_COMMUNITY): Payer: Self-pay

## 2017-02-04 ENCOUNTER — Emergency Department (HOSPITAL_COMMUNITY): Payer: Medicare Other

## 2017-02-04 ENCOUNTER — Emergency Department (HOSPITAL_COMMUNITY)
Admission: EM | Admit: 2017-02-04 | Discharge: 2017-02-05 | Disposition: A | Discharge status: Home / Self Care | Payer: Medicare Other | Attending: Emergency Medicine | Admitting: Emergency Medicine

## 2017-02-04 DIAGNOSIS — K828 Other specified diseases of gallbladder: Secondary | ICD-10-CM

## 2017-02-04 DIAGNOSIS — R110 Nausea: Secondary | ICD-10-CM | POA: Insufficient documentation

## 2017-02-04 DIAGNOSIS — F17210 Nicotine dependence, cigarettes, uncomplicated: Secondary | ICD-10-CM | POA: Diagnosis not present

## 2017-02-04 DIAGNOSIS — R1011 Right upper quadrant pain: Secondary | ICD-10-CM | POA: Diagnosis not present

## 2017-02-04 DIAGNOSIS — Z79899 Other long term (current) drug therapy: Secondary | ICD-10-CM | POA: Insufficient documentation

## 2017-02-04 DIAGNOSIS — R932 Abnormal findings on diagnostic imaging of liver and biliary tract: Secondary | ICD-10-CM | POA: Diagnosis not present

## 2017-02-04 DIAGNOSIS — Z7984 Long term (current) use of oral hypoglycemic drugs: Secondary | ICD-10-CM | POA: Insufficient documentation

## 2017-02-04 DIAGNOSIS — R109 Unspecified abdominal pain: Secondary | ICD-10-CM

## 2017-02-04 LAB — COMPREHENSIVE METABOLIC PANEL
ALBUMIN: 3.4 g/dL — AB (ref 3.5–5.0)
ALK PHOS: 88 U/L (ref 38–126)
ALT: 26 U/L (ref 17–63)
ANION GAP: 10 (ref 5–15)
AST: 26 U/L (ref 15–41)
BILIRUBIN TOTAL: 0.2 mg/dL — AB (ref 0.3–1.2)
BUN: 18 mg/dL (ref 6–20)
CALCIUM: 8.5 mg/dL — AB (ref 8.9–10.3)
CO2: 22 mmol/L (ref 22–32)
CREATININE: 1.02 mg/dL (ref 0.61–1.24)
Chloride: 100 mmol/L — ABNORMAL LOW (ref 101–111)
GFR calc Af Amer: >60 mL/min (ref >60)
GFR calc non Af Amer: >60 mL/min (ref >60)
GLUCOSE: 103 mg/dL — AB (ref 65–99)
Potassium: 4.2 mmol/L (ref 3.5–5.1)
Sodium: 132 mmol/L — ABNORMAL LOW (ref 135–145)
TOTAL PROTEIN: 6.6 g/dL (ref 6.5–8.1)

## 2017-02-04 LAB — URINALYSIS, ROUTINE W REFLEX MICROSCOPIC
BILIRUBIN URINE: NEGATIVE
Bacteria, UA: NONE SEEN
Glucose, UA: NEGATIVE mg/dL
Ketones, ur: NEGATIVE mg/dL
Leukocytes, UA: NEGATIVE
NITRITE: NEGATIVE
PH: 5 (ref 5.0–8.0)
Protein, ur: NEGATIVE mg/dL
RBC / HPF: NONE SEEN RBC/hpf (ref 0–5)
SPECIFIC GRAVITY, URINE: 1.009 (ref 1.005–1.030)
Squamous Epithelial / LPF: NONE SEEN

## 2017-02-04 LAB — LIPASE, BLOOD: Lipase: 45 U/L (ref 11–51)

## 2017-02-04 LAB — CBC
HCT: 37.1 % — ABNORMAL LOW (ref 39.0–52.0)
Hemoglobin: 12.6 g/dL — ABNORMAL LOW (ref 13.0–17.0)
MCH: 31.3 pg (ref 26.0–34.0)
MCHC: 34 g/dL (ref 30.0–36.0)
MCV: 92.1 fL (ref 78.0–100.0)
PLATELETS: 239 10*3/uL (ref 150–400)
RBC: 4.03 MIL/uL — ABNORMAL LOW (ref 4.22–5.81)
RDW: 12.4 % (ref 11.5–15.5)
WBC: 8.9 10*3/uL (ref 4.0–10.5)

## 2017-02-04 NOTE — ED Notes (Signed)
The pt returned from c-t 

## 2017-02-04 NOTE — ED Triage Notes (Signed)
Pt complaining of R flank pain x 1 day. Pt states seen here recently for same. Pt states nausea, no vomiting. Pt denies any diarrhea or urinary symptoms.

## 2017-02-04 NOTE — ED Notes (Signed)
The pt does not want to go c-t but then he does.  He is waiting at present to be taken there

## 2017-02-04 NOTE — ED Provider Notes (Signed)
[name]   CSN: [id number] Arrival date & time: 02/04/17  1926     History   Chief Complaint Chief Complaint  Patient presents with  . Flank Pain  . Nausea    HPI Jeremy Shannon is a 61 y.o. male.  Patient c/o acute onset right flank pain for the past week. Located right upper flank laterally.  Pain constant, dull, moderate, but waxes/wanes in intensity. No specific exacerbating or alleviating factors. No associated sob. Occasional nausea, no vomiting. Denies fall, strain or injury of area. No dysuria, hematuria or hx kidney stones. No hx gallstones. No cough or uri c/o. No fever or chills. Denies pleuritic pain. No leg pain or swelling. No hx dvt or pe.  Denies unusual doe or fatigue. Normal appetite. States was seen by pcp and in ED previously for same.  States had outpt CT chest and [facility] that was normal. Also was told stress test and heart tests were fine. Denies midline back pain or spine pain. No skin changes or lesions to area.    The history is provided by the patient.  Flank Pain  Pertinent negatives include no chest pain, no headaches and no shortness of breath.    Past Medical History:  Diagnosis Date  . Anemia   . Anxiety   . Cataract   . Depression   . Hyperlipidemia     Patient Active Problem List   Diagnosis Date Noted  . Chest pain, rule out acute myocardial infarction 01/23/2017  . Bradycardia with 41-50 beats per minute 01/23/2017  . Polysubstance dependence ([place]) 12/13/2012  . ANXIETY 12/17/2007  . TOBACCO ABUSE 12/17/2007  . DEPRESSION 12/17/2007  . COLONIC POLYPS 05/07/2007  . DIVERTICULOSIS, COLON 05/07/2007    Past Surgical History:  Procedure Laterality Date  . CATARACT EXTRACTION         Home Medications    Prior to Admission medications   Medication Sig Start Date End Date Taking? Authorizing Provider  ALPRAZolam Duanne Moron) 0.5 MG tablet Take maximum of one pill 3 times daily for  anxiety Patient taking differently: Take 0.5 mg by mouth 4 (four) times daily.  12/21/13   Posey Boyer, MD  buprenorphine-naloxone (SUBOXONE) 8-2 mg SUBL SL tablet Place 1 tablet under the tongue daily.    [provider]  fluvoxaMINE (LUVOX) 100 MG tablet Take 100 mg by mouth at bedtime.    [provider]  hydrOXYzine (VISTARIL) 25 MG capsule Take 1 capsule (25 mg total) by mouth 3 (three) times daily as needed for itching. 03/02/13   Everlene Farrier Loura Back, MD  metFORMIN (GLUCOPHAGE) 500 MG tablet Take 500 mg by mouth 2 (two) times daily with a meal.     [provider]  QUEtiapine (SEROQUEL) 100 MG tablet Take 100 mg by mouth at bedtime.    [provider]  simvastatin (ZOCOR) 20 MG tablet Take 20 mg by mouth daily.    [provider]    Family History History reviewed. No pertinent family history.  Social History Social History  Substance Use Topics  . Smoking status: Current Every Day Smoker    Packs/day: 1.00    Years: 43.00    Types: Cigarettes  . Smokeless tobacco: Never Used  . Alcohol use No     Allergies   Patient has no known allergies.   Review of Systems Review of Systems  Constitutional: Negative for fever.  HENT: Negative for sore throat.   Eyes: Negative for redness.  Respiratory: Negative for shortness of breath.   Cardiovascular: Negative for chest pain.  Gastrointestinal: Negative for vomiting.  Genitourinary: Positive for flank pain. Negative for dysuria and hematuria.  Musculoskeletal: Negative for neck pain.  Skin: Negative for rash.  Neurological: Negative for headaches.  Hematological: Does not bruise/bleed easily.  Psychiatric/Behavioral: Negative for confusion.     Physical Exam Updated Vital Signs BP 124/69 (BP Location: Right Arm)   Pulse 83   Temp 98.1 F (36.7 C) (Oral)   Resp 18   SpO2 98%   Physical Exam  Constitutional: He appears well-developed and well-nourished. No distress.  HENT:   Mouth/Throat: Oropharynx is clear and moist.  Eyes: Conjunctivae are normal.  Neck: Neck supple. No tracheal deviation present.  Cardiovascular: Normal rate, regular rhythm, normal heart sounds and intact distal pulses.  Exam reveals no gallop and no friction rub.   No murmur heard. Pulmonary/Chest: Effort normal and breath sounds normal. No accessory muscle usage. No respiratory distress. He exhibits no tenderness.  Abdominal: Soft. Bowel sounds are normal. He exhibits no distension and no mass. There is no tenderness. There is no rebound and no guarding. No hernia.  Genitourinary:  Genitourinary Comments: No cva tenderness  Musculoskeletal: He exhibits no edema.  Neurological: He is alert.  Skin: Skin is warm and dry. No rash noted. He is not diaphoretic.  Psychiatric: He has a normal mood and affect.  Nursing note and vitals reviewed.    ED Treatments / Results  Labs (all labs ordered are listed, but only abnormal results are displayed)   Results for orders placed or performed during the hospital encounter of 02/04/17  Lipase, blood  Result Value Ref Range   Lipase 45 11 - 51 U/L  Comprehensive metabolic panel  Result Value Ref Range   Sodium 132 (L) 135 - 145 mmol/L   Potassium 4.2 3.5 - 5.1 mmol/L   Chloride 100 (L) 101 - 111 mmol/L   CO2 22 22 - 32 mmol/L   Glucose, Bld 103 (H) 65 - 99 mg/dL   BUN 18 6 - 20 mg/dL   Creatinine, Ser 1.02 0.61 - 1.24 mg/dL   Calcium 8.5 (L) 8.9 - 10.3 mg/dL   Total Protein 6.6 6.5 - 8.1 g/dL   Albumin 3.4 (L) 3.5 - 5.0 g/dL   AST 26 15 - 41 U/L   ALT 26 17 - 63 U/L   Alkaline Phosphatase 88 38 - 126 U/L   Total Bilirubin 0.2 (L) 0.3 - 1.2 mg/dL   GFR calc non Af Amer >60 >60 mL/min   GFR calc Af Amer >60 >60 mL/min   Anion gap 10 5 - 15  CBC  Result Value Ref Range   WBC 8.9 4.0 - 10.5 K/uL   RBC 4.03 (L) 4.22 - 5.81 MIL/uL   Hemoglobin 12.6 (L) 13.0 - 17.0 g/dL   HCT 37.1 (L) 39.0 - 52.0 %   MCV 92.1 78.0 - 100.0 fL   MCH  31.3 26.0 - 34.0 pg   MCHC 34.0 30.0 - 36.0 g/dL   RDW 12.4 11.5 - 15.5 %   Platelets 239 150 - 400 K/uL  Urinalysis, Routine w reflex microscopic  Result Value Ref Range   Color, Urine STRAW (A) YELLOW   APPearance CLEAR CLEAR   Specific Gravity, Urine 1.009 1.005 - 1.030   pH 5.0 5.0 - 8.0   Glucose, UA NEGATIVE NEGATIVE mg/dL   Hgb urine dipstick SMALL (A) NEGATIVE   Bilirubin Urine NEGATIVE NEGATIVE  Ketones, ur NEGATIVE NEGATIVE mg/dL   Protein, ur NEGATIVE NEGATIVE mg/dL   Nitrite NEGATIVE NEGATIVE   Leukocytes, UA NEGATIVE NEGATIVE   RBC / HPF NONE SEEN 0 - 5 RBC/hpf   WBC, UA 0-5 0 - 5 WBC/hpf   Bacteria, UA NONE SEEN NONE SEEN   Squamous Epithelial / LPF NONE SEEN NONE SEEN   Dg Chest 2 View  Result Date: 01/23/2017 CLINICAL DATA:  CP/SOB since 6pm today. Hx of diabetes. Smoker. EXAM: CHEST  2 VIEW COMPARISON:  01/23/2017, 03/17/2011, and 12/15/2005 FINDINGS: Heart size is normal. Aorta is mildly tortuous. The lungs are free of focal consolidations and pleural effusions. The appearance of the right lung base is improved, likely related to improved lung inflation and technique. No pulmonary edema. There is mild mid thoracic spondylosis. IMPRESSION: No active cardiopulmonary disease. Electronically Signed   By: Nolon Nations M.D.   On: 01/23/2017 21:58   Dg Chest Portable 1 View  Result Date: 01/23/2017 CLINICAL DATA:  Dyspnea EXAM: PORTABLE CHEST 1 VIEW COMPARISON:  03/17/2011 FINDINGS: Subtle right lower lobe medial consolidation with air bronchograms cannot exclude a pneumonia. No overt pulmonary edema, effusion or pneumothorax. Heart is top-normal in size. There is slight uncoiling of the thoracic aorta without aneurysm. No acute nor suspicious osseous lesions IMPRESSION: Subtle consolidation suggested in the medial right lower lobe with air bronchograms. A pneumonia is not excluded. Otherwise negative exam. Electronically Signed   By: Ashley Royalty M.D.   On: 01/23/2017  20:48    EKG  EKG Interpretation None       Radiology Ct Renal Stone Study  Result Date: 02/04/2017 CLINICAL DATA:  Right flank pain. EXAM: CT ABDOMEN AND PELVIS WITHOUT CONTRAST TECHNIQUE: Multidetector CT imaging of the abdomen and pelvis was performed following the standard protocol without IV contrast. COMPARISON:  None. FINDINGS: Lower chest: Mild atelectasis in the lung bases. Low-attenuation circumscribed nodular lesion in the subcutaneous fat posterior to the left lower ribs likely represents a sebaceous cyst. Hepatobiliary: Increased density in the gallbladder suggesting diffusely sludge filled gallbladder. Possible small stones. The gallbladder wall is thickened and edematous with surrounding infiltration. Changes suggest acute cholecystitis in the appropriate clinical setting. No focal liver lesions identified. No bile duct dilatation. Pancreas: Mild fatty atrophy of the pancreas. No focal inflammatory changes. Spleen: Normal in size without focal abnormality. Adrenals/Urinary Tract: No adrenal gland nodules. No hydronephrosis or hydroureter. Kidneys are symmetrical in size. No renal, ureteral, or bladder stones identified. Stomach/Bowel: Stomach and small bowel are mostly decompressed. Scattered stool throughout the colon. No colonic distention or wall thickening. Appendix is normal Vascular/Lymphatic: Aortic atherosclerosis. No enlarged abdominal or pelvic lymph nodes. Reproductive: Prostate is unremarkable. Other: No abdominal wall hernia or abnormality. No abdominopelvic ascites. Musculoskeletal: No acute or significant osseous findings. IMPRESSION: Abnormal gallbladder, likely filled with stones and sludge. Edematous wall thickening with surrounding inflammation suggesting acute cholecystitis. No renal or ureteral stone or obstruction. Electronically Signed   By: Lucienne Capers M.D.   On: 02/04/2017 23:45    Procedures Procedures (including critical care time)  Medications  Ordered in ED Medications - No data to display   Initial Impression / Assessment and Plan / ED Course  I have reviewed the triage vital signs and the nursing notes.  Pertinent labs & imaging results that were available during my care of the patient were reviewed by me and considered in my medical decision making (see chart for details).  Labs. Imaging.  Pt declines wanting any pain  medication in ED.    Reviewed nursing notes and prior charts for additional history.   CT notes abnormal gallbladder, possible cholecystitis. Wbc is normal, lfts normal, afeb. Mild tenderness on recheck.  Will get u/s - suspected more likely biliary colic as opposed to acute cholecystitis.   2357, sign out to Dr Dina Rich to check u/s result, if neg for acute cholecystitis, may d/c w gen surg f/u if gallstones.  If cholecystitis, admit.     Final Clinical Impressions(s) / ED Diagnoses   Final diagnoses:  None    New Prescriptions New Prescriptions   No medications on file     Lajean Saver, MD 02/04/17 2359

## 2017-02-04 NOTE — ED Notes (Signed)
Pt asking how long before he goes somewhere  c-t very busy

## 2017-02-04 NOTE — ED Notes (Signed)
The pt is c/o rt sided chest abd and or flank pain for the past week  He has no pain at present   No n v

## 2017-02-04 NOTE — Discharge Instructions (Addendum)
It was our pleasure to provide your ER care today - we hope that you feel better.  You may take acetaminophen and/or ibuprofen as need for pain.  Follow up with primary doctor the coming week - call office on Tuesday to arrange appointment.   For gallbladder, follow up with general surgeon - call office to arrange appointment.   Return to ER if worse, new symptoms, fevers, new or severe pain, difficulty breathing, other concern.

## 2017-02-05 ENCOUNTER — Other Ambulatory Visit (HOSPITAL_COMMUNITY): Payer: Medicare Other

## 2017-02-05 ENCOUNTER — Emergency Department (HOSPITAL_COMMUNITY): Payer: Medicare Other

## 2017-02-05 MED ORDER — ONDANSETRON 4 MG PO TBDP: 4.0000 mg | ORAL_TABLET | Freq: Three times a day (TID) | ORAL | 0 refills | Status: DC | PRN

## 2017-02-05 MED ORDER — ONDANSETRON HCL 4 MG/2ML IJ SOLN: 4.0000 mg | Freq: Once | INTRAMUSCULAR | Status: DC

## 2017-02-05 MED ORDER — ONDANSETRON 4 MG PO TBDP
4.0000 mg | ORAL_TABLET | Freq: Once | ORAL | Status: AC
Start: 2017-02-05 — End: 2017-02-05
  Administered 2017-02-05: 4 mg via ORAL
  Filled 2017-02-05: qty 1

## 2017-02-05 NOTE — ED Notes (Signed)
Pt sleepy

## 2017-02-05 NOTE — Progress Notes (Signed)
CM received call from ED to reach out to pt who discharged from ED.  Cm called pt and asked if he had questions.  Pt wanted to know when he should call [employer].  Cm verified contact informaiton on back of pt's discharge instructions and pt verbalized understanding he will call on Monday (and again on Tuesday if he finds the office closed). Pt states he is doing well and has no pain.  Pt had no other Questions. No other Cm needs were communicated.

## 2017-02-05 NOTE — ED Notes (Signed)
Patient transported to X-ray 

## 2017-02-05 NOTE — ED Notes (Signed)
The pt returned from ultra sound

## 2017-02-05 NOTE — ED Notes (Signed)
To ultrasound

## 2017-02-05 NOTE — ED Notes (Signed)
Pt up to the br  He continues to c/o everything   He does not want the bp cuff and pulse to stay on.

## 2017-02-05 NOTE — ED Provider Notes (Signed)
1:55 AM  Ultrasound resulted. Patient with gallbladder sludge and thickening of the gallbladder wall. No pericholecystic fluid. Question early cholecystitis. Patient reports persistent nausea on repeat evaluation. Minimal epigastric tenderness to palpation. He has had over one week of symptoms with multiple evaluations including cardiac evaluation. Given this, will consult general surgery. His presentation is very atypical. Lab work is reassuring. Patient dosed Zofran.  Discussed with Dr. Hulen Skains. Given normal lab work and atypical history, he is not convinced this is his gallbladder. He will need a HIDA scan to further evaluate.  3:56 AM Patient tolerating food. I discussed with patient admission for symptom control and hiatus scan versus outpatient follow-up with general surgery for further evaluation and HIDA.  Patient elected for outpatient follow-up.  After history, exam, and medical workup I feel the patient has been appropriately medically screened and is safe for discharge home. Pertinent diagnoses were discussed with the patient. Patient was given return precautions.    Merryl Hacker, MD 02/05/17 [phone]

## 2017-02-07 ENCOUNTER — Encounter (HOSPITAL_COMMUNITY): Payer: Self-pay

## 2017-02-07 NOTE — Progress Notes (Signed)
Please place orders in EPIC as patient is being scheduled for a pre-op appointment! Thank you! 

## 2017-02-07 NOTE — Patient Instructions (Addendum)
Jeremy Shannon  02/07/2017   Your procedure is scheduled on: 02/10/17  Report to [facility] Main  Entrance Take [place]  elevators to 3rd floor to  [place] at      1015AM.    Call this number if you have problems the morning of surgery [phone]    Remember: ONLY 1 PERSON MAY GO WITH YOU TO SHORT STAY TO GET  READY MORNING OF YOUR SURGERY.  Do not eat food or drink liquids :After Midnight.     Take these medicines the morning of surgery with A SIP OF WATER: xanax if needed DO NOT TAKE ANY DIABETIC MEDICATIONS DAY OF YOUR SURGERY                               You may not have any metal on your body including hair pins and              piercings  Do not wear jewelry,lotions, powders or perfumes, deodorant                      Men may shave face and neck.   Do not bring valuables to the hospital. [place].  Contacts, dentures or bridgework may not be worn into surgery.  Leave suitcase in the car. After surgery it may be brought to your room.                 Please read over the following fact sheets you were given: _____________________________________________________________________            [facility] - Preparing for Surgery Before surgery, you can play an important role.  Because skin is not sterile, your skin needs to be as free of germs as possible.  You can reduce the number of germs on your skin by washing with CHG (chlorahexidine gluconate) soap before surgery.  CHG is an antiseptic cleaner which kills germs and bonds with the skin to continue killing germs even after washing. Please DO NOT use if you have an allergy to CHG or antibacterial soaps.  If your skin becomes reddened/irritated stop using the CHG and inform your nurse when you arrive at Short Stay. Do not shave (including legs and underarms) for at least 48 hours prior to the first CHG shower.  You may shave your  face/neck. Please follow these instructions carefully:  1.  Shower with CHG Soap the night before surgery and the  morning of Surgery.  2.  If you choose to wash your hair, wash your hair first as usual with your  normal  shampoo.  3.  After you shampoo, rinse your hair and body thoroughly to remove the  shampoo.                           4.  Use CHG as you would any other liquid soap.  You can apply chg directly  to the skin and wash                       Gently with a scrungie or clean washcloth.  5.  Apply the CHG Soap to your body ONLY FROM  THE NECK DOWN.   Do not use on face/ open                           Wound or open sores. Avoid contact with eyes, ears mouth and genitals (private parts).                       Wash face,  Genitals (private parts) with your normal soap.             6.  Wash thoroughly, paying special attention to the area where your surgery  will be performed.  7.  Thoroughly rinse your body with warm water from the neck down.  8.  DO NOT shower/wash with your normal soap after using and rinsing off  the CHG Soap.                9.  Pat yourself dry with a clean towel.            10.  Wear clean pajamas.            11.  Place clean sheets on your bed the night of your first shower and do not  sleep with pets. Day of Surgery : Do not apply any lotions/deodorants the morning of surgery.  Please wear clean clothes to the hospital/surgery center.  FAILURE TO FOLLOW THESE INSTRUCTIONS MAY RESULT IN THE CANCELLATION OF YOUR SURGERY PATIENT SIGNATURE_________________________________  NURSE SIGNATURE__________________________________  ________________________________________________________________________

## 2017-02-08 ENCOUNTER — Encounter (HOSPITAL_COMMUNITY)
Admission: RE | Admit: 2017-02-08 | Discharge: 2017-02-08 | Disposition: A | Discharge status: Home / Self Care | Payer: Medicare Other | Source: Ambulatory Visit | Attending: Surgery | Admitting: Surgery

## 2017-02-08 ENCOUNTER — Encounter (HOSPITAL_COMMUNITY): Payer: Self-pay

## 2017-02-08 ENCOUNTER — Other Ambulatory Visit: Payer: Self-pay | Admitting: Surgery

## 2017-02-08 DIAGNOSIS — E669 Obesity, unspecified: Secondary | ICD-10-CM | POA: Diagnosis not present

## 2017-02-08 DIAGNOSIS — D649 Anemia, unspecified: Secondary | ICD-10-CM | POA: Diagnosis not present

## 2017-02-08 DIAGNOSIS — F419 Anxiety disorder, unspecified: Secondary | ICD-10-CM | POA: Diagnosis not present

## 2017-02-08 DIAGNOSIS — K8010 Calculus of gallbladder with chronic cholecystitis without obstruction: Secondary | ICD-10-CM | POA: Diagnosis present

## 2017-02-08 DIAGNOSIS — F329 Major depressive disorder, single episode, unspecified: Secondary | ICD-10-CM | POA: Diagnosis not present

## 2017-02-08 DIAGNOSIS — Z6833 Body mass index (BMI) 33.0-33.9, adult: Secondary | ICD-10-CM | POA: Diagnosis not present

## 2017-02-08 DIAGNOSIS — K8012 Calculus of gallbladder with acute and chronic cholecystitis without obstruction: Secondary | ICD-10-CM | POA: Diagnosis not present

## 2017-02-08 DIAGNOSIS — E119 Type 2 diabetes mellitus without complications: Secondary | ICD-10-CM | POA: Diagnosis not present

## 2017-02-08 DIAGNOSIS — F17200 Nicotine dependence, unspecified, uncomplicated: Secondary | ICD-10-CM | POA: Diagnosis not present

## 2017-02-08 DIAGNOSIS — Z7984 Long term (current) use of oral hypoglycemic drugs: Secondary | ICD-10-CM | POA: Diagnosis not present

## 2017-02-08 DIAGNOSIS — E785 Hyperlipidemia, unspecified: Secondary | ICD-10-CM | POA: Diagnosis not present

## 2017-02-08 DIAGNOSIS — F17210 Nicotine dependence, cigarettes, uncomplicated: Secondary | ICD-10-CM | POA: Diagnosis not present

## 2017-02-08 DIAGNOSIS — Z79899 Other long term (current) drug therapy: Secondary | ICD-10-CM | POA: Diagnosis not present

## 2017-02-08 HISTORY — DX: Type 2 diabetes mellitus without complications: E11.9

## 2017-02-08 HISTORY — DX: Other specified postprocedural states: R11.2

## 2017-02-08 HISTORY — DX: Other specified postprocedural states: Z98.890

## 2017-02-08 LAB — CBC
HCT: 37.4 % — ABNORMAL LOW (ref 39.0–52.0)
Hemoglobin: 12.5 g/dL — ABNORMAL LOW (ref 13.0–17.0)
MCH: 31 pg (ref 26.0–34.0)
MCHC: 33.4 g/dL (ref 30.0–36.0)
MCV: 92.8 fL (ref 78.0–100.0)
PLATELETS: 292 10*3/uL (ref 150–400)
RBC: 4.03 MIL/uL — ABNORMAL LOW (ref 4.22–5.81)
RDW: 12.5 % (ref 11.5–15.5)
WBC: 8.2 10*3/uL (ref 4.0–10.5)

## 2017-02-08 LAB — COMPREHENSIVE METABOLIC PANEL
ALBUMIN: 3.6 g/dL (ref 3.5–5.0)
ALK PHOS: 86 U/L (ref 38–126)
ALT: 25 U/L (ref 17–63)
AST: 26 U/L (ref 15–41)
Anion gap: 6 (ref 5–15)
BILIRUBIN TOTAL: 0.3 mg/dL (ref 0.3–1.2)
BUN: 21 mg/dL — AB (ref 6–20)
CALCIUM: 9.1 mg/dL (ref 8.9–10.3)
CO2: 30 mmol/L (ref 22–32)
CREATININE: 1.1 mg/dL (ref 0.61–1.24)
Chloride: 103 mmol/L (ref 101–111)
GFR calc Af Amer: >60 mL/min (ref >60)
GLUCOSE: 112 mg/dL — AB (ref 65–99)
Potassium: 4.4 mmol/L (ref 3.5–5.1)
Sodium: 139 mmol/L (ref 135–145)
TOTAL PROTEIN: 7 g/dL (ref 6.5–8.1)

## 2017-02-08 LAB — GLUCOSE, CAPILLARY: Glucose-Capillary: 105 mg/dL — ABNORMAL HIGH (ref 65–99)

## 2017-02-08 NOTE — Progress Notes (Signed)
ekg 01/23/17 epic cxr 01/23/17 epic Stress 02/01/17 epic

## 2017-02-09 LAB — HEMOGLOBIN A1C
Hgb A1c MFr Bld: 6 % — ABNORMAL HIGH (ref 4.8–5.6)
MEAN PLASMA GLUCOSE: 126 mg/dL

## 2017-02-09 NOTE — H&P (Signed)
Jeremy Shannon  Location: [facility] Patient #: [id number] DOB: [birth year] Separated / Language: Cleophus Molt / Race: White Male  History of Present Illness   The patient is a 61 year old male who presents with a complaint of gall bladder disease.  His last name is pronounced "Tinsas"  The PCP is Dr. Talbert Forest Corrington [facility]) The patient was referred by self.  He is not a great historian. He did not mention being in the hospital for a recent cardiac evaluation (01/23/2017 - 01/24/2017). The patient has noticed for about 4 weeks pain in his right upper quadrant that radiates to his back. It seems like some of his symptoms were misinterpreted as cardiac disease and this is why he was evaluated for his heart. He describes his pain as being fairly constant except when he takes Advil. Certain foods do not trigger the pain. He's had some chronic nausea and vomiting just a couple times. He's noticed no jaundice or change in his bowel habits. He's had no prior abdominal surgery. His last colonoscopy was 9 years ago. He said that they found a couple of polyps. He is unsure who did the colonoscopy. He has no history of stomach, liver, pancreas, or colon disease. Because of his symptoms he went to the emergency room on 04 Feb 2017. He had a CT scan which showed an abnormal gallbladder, likely filled with stones and sludge. Edematous wall thickening with surrounding inflammation. He had an [place] on 02/05/2017 which shows a 2.4 cm gall stone - lodged in the neck of his gall bladder.  I discussed with the patient the indications and risks of gall bladder surgery. The primary risks of gall bladder surgery include, but are not limited to, bleeding, infection, common bile duct injury, and open surgery. There is also the risk that the patient may have continued symptoms after surgery. We discussed the typical post-operative recovery course. I tried to answer the patient's  questions. I gave the patient literature about gall bladder surgery. Because he lives with a 69 year old mother, consider keeping him overnight after surgery.  Past Medical History: 1. DM - on metformin (though he says he has pre diabetes) 2. Obese - BMI 33 3. Smokes - 1 ppd 4. History of depression/anxiety 5. Gated perfusion test - 02/01/2017 - low risk Seen by Dr. Jerilynn Mages. Croitoru, but with no follow up  Social History: Separated lives with 27 yo mother Has step daughter Retired Presenter, broadcasting from [place]    Past Surgical History Malachy Moan, [place]; 02/07/2017 8:59 AM) No pertinent past surgical history   Diagnostic Studies History Malachy Moan, RMA; 02/07/2017 8:59 AM) Colonoscopy  >10 years ago  Allergies Malachy Moan, RMA; 02/07/2017 8:59 AM) No Known Allergies 02/07/2017  Medication History Malachy Moan, RMA; 02/07/2017 9:00 AM) Suboxone (8-2MG  Film, Sublingual) Active. ALPRAZolam (1MG  Tablet, Oral) Active. FluvoxaMINE Maleate (100MG  Tablet, Oral) Active. MetFORMIN HCl (500MG  Tablet, Oral) Active. Ondansetron (4MG  Tablet Disint, Oral) Active. QUEtiapine Fumarate (100MG  Tablet, Oral) Active. Simvastatin (20MG  Tablet, Oral) Active. Medications Reconciled  Social History Malachy Moan, [place]; 02/07/2017 8:59 AM) Caffeine use  Carbonated beverages. No alcohol use  No drug use  Tobacco use  Current every day smoker.  Family History Malachy Moan, [place]; 02/07/2017 8:59 AM) Family history unknown  First Degree Relatives   Other Problems Malachy Moan, [place]; 02/07/2017 8:59 AM) Anxiety Disorder  Depression  Diabetes Mellitus   Review of Systems Malachy Moan RMA; 02/07/2017 8:59 AM) General Not Present- Appetite Loss, Chills, Fatigue, Fever, Night Sweats, Weight  Gain and Weight Loss. Skin Not Present- Change in Wart/Mole, Dryness, Hives, Jaundice, New Lesions, Non-Healing Wounds, Rash and Ulcer. HEENT  Not Present- Earache, Hearing Loss, Hoarseness, Nose Bleed, Oral Ulcers, Ringing in the Ears, Seasonal Allergies, Sinus Pain, Sore Throat, Visual Disturbances, Wears glasses/contact lenses and Yellow Eyes. Respiratory Not Present- Bloody sputum, Chronic Cough, Difficulty Breathing, Snoring and Wheezing. Cardiovascular Not Present- Chest Pain, Difficulty Breathing Lying Down, Leg Cramps, Palpitations, Rapid Heart Rate, Shortness of Breath and Swelling of Extremities. Gastrointestinal Present- Abdominal Pain. Not Present- Bloating, Bloody Stool, Change in Bowel Habits, Chronic diarrhea, Constipation, Difficulty Swallowing, Excessive gas, Gets full quickly at meals, Hemorrhoids, Indigestion, Nausea, Rectal Pain and Vomiting. Male Genitourinary Not Present- Blood in Urine, Change in Urinary Stream, Frequency, Impotence, Nocturia, Painful Urination, Urgency and Urine Leakage.  Vitals Malachy Moan RMA; 02/07/2017 9:00 AM) 02/07/2017 9:00 AM Weight: 218 lb Height: 68in Body Surface Area: 2.12 m Body Mass Index: 33.15 kg/m  Temp.: 98.84F  Pulse: 87 (Regular)  BP: 90/54 (Sitting, Left Arm, Standard)   Physical Exam  General: Moderately overweight WM alert. He looks like he feels a little puny. He has a slow speech pattern. Skin: Inspection and palpation of the skin unremarkable.  Eyes: Conjunctivae white, pupils equal. Face, ears, nose, mouth, and throat: Face - normal. Normal ears and nose. Lips and teeth normal.  Neck: Supple. No mass. Trachea midline. No thyroid mass.  Lymph Nodes: No supraclavicular or cervical adenopathy. No axillary adenopathy.  Lungs: Normal respiratory effort. Clear to auscultation and symmetric breath sounds. Cardiovascular: Regular rate and rythm. Normal auscultation of the heart. No murmur or rub. Normal carotid pulse.  Abdomen: Soft. No mass. Liver and spleen not palpable. No hernia. Normal bowel sounds. No abdominal scars.  Mildly obese. Sore RUQ. Rectal: Not done.  Musculoskeletal/extremities: Normal gait. Good strength and ROM in upper and lower extremities.   Neurologic: Grossly intact to motor and sensory function.   Psychiatric: Has normal mood and affect. Judgement and insight appear normal.  Assessment & Plan  1.  CHOLECYSTITIS, CHRONIC (K81.1)  Plan  1) Schedule surgery - looks to be scheduled for surgery  2.  SMOKES (F17.200)  3. DM - on metformin   HgbA1C - 6.0 - mildly elevated 4. Obese - BMI 33  5. History of depression/anxiety   Alphonsa Overall, MD, [facility] Pager: [phone] Office phone:  [phone]

## 2017-02-10 ENCOUNTER — Encounter (HOSPITAL_COMMUNITY): Payer: Self-pay | Admitting: Anesthesiology

## 2017-02-10 ENCOUNTER — Observation Stay (HOSPITAL_COMMUNITY)
Admission: RE | Admit: 2017-02-10 | Discharge: 2017-02-11 | Disposition: A | Discharge status: Home / Self Care | Payer: Medicare Other | Source: Ambulatory Visit | Attending: Surgery | Admitting: Surgery

## 2017-02-10 ENCOUNTER — Ambulatory Visit (HOSPITAL_COMMUNITY): Payer: Medicare Other | Admitting: Anesthesiology

## 2017-02-10 ENCOUNTER — Ambulatory Visit (HOSPITAL_COMMUNITY): Payer: Medicare Other

## 2017-02-10 ENCOUNTER — Encounter (HOSPITAL_COMMUNITY)
Admission: RE | Disposition: A | Discharge status: Home / Self Care | Payer: Self-pay | Source: Ambulatory Visit | Attending: Surgery

## 2017-02-10 DIAGNOSIS — E119 Type 2 diabetes mellitus without complications: Secondary | ICD-10-CM | POA: Insufficient documentation

## 2017-02-10 DIAGNOSIS — D649 Anemia, unspecified: Secondary | ICD-10-CM | POA: Insufficient documentation

## 2017-02-10 DIAGNOSIS — Z7984 Long term (current) use of oral hypoglycemic drugs: Secondary | ICD-10-CM | POA: Diagnosis not present

## 2017-02-10 DIAGNOSIS — F17210 Nicotine dependence, cigarettes, uncomplicated: Secondary | ICD-10-CM | POA: Insufficient documentation

## 2017-02-10 DIAGNOSIS — E785 Hyperlipidemia, unspecified: Secondary | ICD-10-CM | POA: Insufficient documentation

## 2017-02-10 DIAGNOSIS — F419 Anxiety disorder, unspecified: Secondary | ICD-10-CM | POA: Insufficient documentation

## 2017-02-10 DIAGNOSIS — F329 Major depressive disorder, single episode, unspecified: Secondary | ICD-10-CM | POA: Insufficient documentation

## 2017-02-10 DIAGNOSIS — Z79899 Other long term (current) drug therapy: Secondary | ICD-10-CM | POA: Insufficient documentation

## 2017-02-10 DIAGNOSIS — K8012 Calculus of gallbladder with acute and chronic cholecystitis without obstruction: Principal | ICD-10-CM | POA: Insufficient documentation

## 2017-02-10 DIAGNOSIS — Z6833 Body mass index (BMI) 33.0-33.9, adult: Secondary | ICD-10-CM | POA: Insufficient documentation

## 2017-02-10 DIAGNOSIS — E669 Obesity, unspecified: Secondary | ICD-10-CM | POA: Insufficient documentation

## 2017-02-10 DIAGNOSIS — K812 Acute cholecystitis with chronic cholecystitis: Secondary | ICD-10-CM | POA: Diagnosis present

## 2017-02-10 DIAGNOSIS — F17200 Nicotine dependence, unspecified, uncomplicated: Secondary | ICD-10-CM | POA: Insufficient documentation

## 2017-02-10 DIAGNOSIS — K819 Cholecystitis, unspecified: Secondary | ICD-10-CM

## 2017-02-10 HISTORY — PX: CHOLECYSTECTOMY: SHX55

## 2017-02-10 LAB — GLUCOSE, CAPILLARY: Glucose-Capillary: 121 mg/dL — ABNORMAL HIGH (ref 65–99)

## 2017-02-10 SURGERY — LAPAROSCOPIC CHOLECYSTECTOMY WITH INTRAOPERATIVE CHOLANGIOGRAM
Anesthesia: General | Site: Abdomen

## 2017-02-10 MED ORDER — MEPERIDINE HCL 50 MG/ML IJ SOLN: 6.2500 mg | INTRAMUSCULAR | Status: DC | PRN

## 2017-02-10 MED ORDER — HEPARIN SODIUM (PORCINE) 5000 UNIT/ML IJ SOLN
5000.0000 [IU] | Freq: Three times a day (TID) | INTRAMUSCULAR | Status: DC
  Administered 2017-02-10 – 2017-02-11 (×2): 5000 [IU] via SUBCUTANEOUS
  Filled 2017-02-10 (×2): qty 1

## 2017-02-10 MED ORDER — MORPHINE SULFATE (PF) 2 MG/ML IV SOLN
1.0000 mg | INTRAVENOUS | Status: DC | PRN
  Administered 2017-02-10 (×2): 4 mg via INTRAVENOUS
  Filled 2017-02-10 (×2): qty 2

## 2017-02-10 MED ORDER — ROCURONIUM BROMIDE 10 MG/ML (PF) SYRINGE
PREFILLED_SYRINGE | INTRAVENOUS | Status: DC | PRN
  Administered 2017-02-10: 20 mg via INTRAVENOUS
  Administered 2017-02-10: 5 mg via INTRAVENOUS
  Administered 2017-02-10: 10 mg via INTRAVENOUS
  Administered 2017-02-10 (×2): 5 mg via INTRAVENOUS
  Administered 2017-02-10: 50 mg via INTRAVENOUS

## 2017-02-10 MED ORDER — LIDOCAINE 2% (20 MG/ML) 5 ML SYRINGE
INTRAMUSCULAR | Status: AC
  Filled 2017-02-10: qty 5

## 2017-02-10 MED ORDER — QUETIAPINE FUMARATE 100 MG PO TABS: 300.0000 mg | ORAL_TABLET | Freq: Every evening | ORAL | Status: DC | PRN

## 2017-02-10 MED ORDER — ONDANSETRON HCL 4 MG/2ML IJ SOLN: 4.0000 mg | Freq: Four times a day (QID) | INTRAMUSCULAR | Status: DC | PRN

## 2017-02-10 MED ORDER — KETOROLAC TROMETHAMINE 30 MG/ML IJ SOLN: 30.0000 mg | Freq: Once | INTRAMUSCULAR | Status: DC | PRN

## 2017-02-10 MED ORDER — KCL IN DEXTROSE-NACL 20-5-0.45 MEQ/L-%-% IV SOLN
INTRAVENOUS | Status: DC
  Administered 2017-02-10: 16:00:00 via INTRAVENOUS
  Filled 2017-02-10 (×2): qty 1000

## 2017-02-10 MED ORDER — ONDANSETRON HCL 4 MG/2ML IJ SOLN
INTRAMUSCULAR | Status: DC | PRN
  Administered 2017-02-10: 4 mg via INTRAVENOUS

## 2017-02-10 MED ORDER — HYDROCODONE-ACETAMINOPHEN 5-325 MG PO TABS
1.0000 | ORAL_TABLET | ORAL | Status: DC | PRN
  Administered 2017-02-10: 2 via ORAL
  Filled 2017-02-10: qty 2

## 2017-02-10 MED ORDER — IOPAMIDOL (ISOVUE-300) INJECTION 61%
INTRAVENOUS | Status: AC
  Filled 2017-02-10: qty 50

## 2017-02-10 MED ORDER — ROCURONIUM BROMIDE 50 MG/5ML IV SOSY
PREFILLED_SYRINGE | INTRAVENOUS | Status: AC
  Filled 2017-02-10: qty 5

## 2017-02-10 MED ORDER — ONDANSETRON HCL 4 MG/2ML IJ SOLN
INTRAMUSCULAR | Status: AC
  Filled 2017-02-10: qty 2

## 2017-02-10 MED ORDER — IBUPROFEN 200 MG PO TABS
600.0000 mg | ORAL_TABLET | Freq: Four times a day (QID) | ORAL | Status: DC | PRN
  Administered 2017-02-11: 600 mg via ORAL
  Filled 2017-02-10: qty 3

## 2017-02-10 MED ORDER — PROMETHAZINE HCL 25 MG/ML IJ SOLN
6.2500 mg | INTRAMUSCULAR | Status: DC | PRN
  Administered 2017-02-10: 12.5 mg via INTRAVENOUS

## 2017-02-10 MED ORDER — FENTANYL CITRATE (PF) 100 MCG/2ML IJ SOLN
INTRAMUSCULAR | Status: DC | PRN
  Administered 2017-02-10: 50 ug via INTRAVENOUS
  Administered 2017-02-10 (×2): 75 ug via INTRAVENOUS
  Administered 2017-02-10: 100 ug via INTRAVENOUS

## 2017-02-10 MED ORDER — FENTANYL CITRATE (PF) 100 MCG/2ML IJ SOLN
INTRAMUSCULAR | Status: AC
  Filled 2017-02-10: qty 4

## 2017-02-10 MED ORDER — FENTANYL CITRATE (PF) 100 MCG/2ML IJ SOLN
25.0000 ug | INTRAMUSCULAR | Status: DC | PRN
  Administered 2017-02-10: 50 ug via INTRAVENOUS
  Administered 2017-02-10: 25 ug via INTRAVENOUS

## 2017-02-10 MED ORDER — LIDOCAINE 2% (20 MG/ML) 5 ML SYRINGE
INTRAMUSCULAR | Status: DC | PRN
  Administered 2017-02-10 (×2): 100 mg via INTRAVENOUS

## 2017-02-10 MED ORDER — LACTATED RINGERS IV SOLN
INTRAVENOUS | Status: DC
  Administered 2017-02-10 (×3): via INTRAVENOUS

## 2017-02-10 MED ORDER — GABAPENTIN 300 MG PO CAPS
300.0000 mg | ORAL_CAPSULE | ORAL | Status: AC
  Administered 2017-02-10: 300 mg via ORAL
  Filled 2017-02-10: qty 1

## 2017-02-10 MED ORDER — BUPIVACAINE-EPINEPHRINE (PF) 0.25% -1:200000 IJ SOLN
INTRAMUSCULAR | Status: AC
  Filled 2017-02-10: qty 30

## 2017-02-10 MED ORDER — DEXTROSE 5 % IV SOLN
2.0000 g | INTRAVENOUS | Status: DC
  Administered 2017-02-10: 2 g via INTRAVENOUS
  Filled 2017-02-10: qty 2

## 2017-02-10 MED ORDER — MIDAZOLAM HCL 2 MG/2ML IJ SOLN
INTRAMUSCULAR | Status: AC
  Filled 2017-02-10: qty 2

## 2017-02-10 MED ORDER — 0.9 % SODIUM CHLORIDE (POUR BTL) OPTIME
TOPICAL | Status: DC | PRN
  Administered 2017-02-10: 1000 mL

## 2017-02-10 MED ORDER — DEXAMETHASONE SODIUM PHOSPHATE 10 MG/ML IJ SOLN
INTRAMUSCULAR | Status: AC
  Filled 2017-02-10: qty 1

## 2017-02-10 MED ORDER — MIDAZOLAM HCL 5 MG/5ML IJ SOLN
INTRAMUSCULAR | Status: DC | PRN
  Administered 2017-02-10: 2 mg via INTRAVENOUS

## 2017-02-10 MED ORDER — PROPOFOL 10 MG/ML IV BOLUS
INTRAVENOUS | Status: AC
  Filled 2017-02-10: qty 20

## 2017-02-10 MED ORDER — ONDANSETRON 4 MG PO TBDP: 4.0000 mg | ORAL_TABLET | Freq: Four times a day (QID) | ORAL | Status: DC | PRN

## 2017-02-10 MED ORDER — SUGAMMADEX SODIUM 200 MG/2ML IV SOLN
INTRAVENOUS | Status: AC
  Filled 2017-02-10: qty 2

## 2017-02-10 MED ORDER — CEFAZOLIN SODIUM-DEXTROSE 2-4 GM/100ML-% IV SOLN
2.0000 g | INTRAVENOUS | Status: AC
  Administered 2017-02-10: 2 g via INTRAVENOUS
  Filled 2017-02-10: qty 100

## 2017-02-10 MED ORDER — PROMETHAZINE HCL 25 MG/ML IJ SOLN
INTRAMUSCULAR | Status: AC
  Administered 2017-02-10: 12.5 mg via INTRAVENOUS
  Filled 2017-02-10: qty 1

## 2017-02-10 MED ORDER — DEXAMETHASONE SODIUM PHOSPHATE 10 MG/ML IJ SOLN
INTRAMUSCULAR | Status: DC | PRN
  Administered 2017-02-10: 10 mg via INTRAVENOUS

## 2017-02-10 MED ORDER — ALPRAZOLAM 1 MG PO TABS
1.0000 mg | ORAL_TABLET | Freq: Four times a day (QID) | ORAL | Status: DC | PRN
  Administered 2017-02-10 – 2017-02-11 (×2): 1 mg via ORAL
  Filled 2017-02-10 (×2): qty 1

## 2017-02-10 MED ORDER — IOPAMIDOL (ISOVUE-300) INJECTION 61%
INTRAVENOUS | Status: DC | PRN
  Administered 2017-02-10: 2.5 mL

## 2017-02-10 MED ORDER — FENTANYL CITRATE (PF) 100 MCG/2ML IJ SOLN
INTRAMUSCULAR | Status: AC
  Filled 2017-02-10: qty 2

## 2017-02-10 MED ORDER — ACETAMINOPHEN 500 MG PO TABS
1000.0000 mg | ORAL_TABLET | ORAL | Status: AC
  Administered 2017-02-10: 1000 mg via ORAL
  Filled 2017-02-10: qty 2

## 2017-02-10 MED ORDER — EPHEDRINE 5 MG/ML INJ
INTRAVENOUS | Status: AC
  Filled 2017-02-10: qty 10

## 2017-02-10 MED ORDER — PROPOFOL 10 MG/ML IV BOLUS
INTRAVENOUS | Status: DC | PRN
  Administered 2017-02-10: 150 mg via INTRAVENOUS

## 2017-02-10 MED ORDER — FENTANYL CITRATE (PF) 250 MCG/5ML IJ SOLN
INTRAMUSCULAR | Status: AC
  Filled 2017-02-10: qty 5

## 2017-02-10 MED ORDER — SUGAMMADEX SODIUM 200 MG/2ML IV SOLN
INTRAVENOUS | Status: DC | PRN
  Administered 2017-02-10: 200 mg via INTRAVENOUS

## 2017-02-10 MED ORDER — LACTATED RINGERS IR SOLN
Status: DC | PRN
  Administered 2017-02-10: 3000 mL

## 2017-02-10 SURGICAL SUPPLY — 46 items
ADH SKN CLS APL DERMABOND .7 (GAUZE/BANDAGES/DRESSINGS) ×1
APL SKNCLS STERI-STRIP NONHPOA (GAUZE/BANDAGES/DRESSINGS)
APPLIER CLIP 5 13 M/L LIGAMAX5 (MISCELLANEOUS)
APPLIER CLIP ROT 10 11.4 M/L (STAPLE)
APR CLP MED LRG 11.4X10 (STAPLE)
APR CLP MED LRG 5 ANG JAW (MISCELLANEOUS)
BAG SPEC RTRVL 10 TROC 200 (ENDOMECHANICALS) ×1
BAG SPEC RTRVL LRG 6X4 10 (ENDOMECHANICALS)
BENZOIN TINCTURE PRP APPL 2/3 (GAUZE/BANDAGES/DRESSINGS) IMPLANT
CABLE HIGH FREQUENCY MONO STRZ (ELECTRODE) ×2 IMPLANT
CHLORAPREP W/TINT 26ML (MISCELLANEOUS) ×2 IMPLANT
CHOLANGIOGRAM CATH TAUT (CATHETERS) ×2 IMPLANT
CLIP APPLIE 5 13 M/L LIGAMAX5 (MISCELLANEOUS) IMPLANT
CLIP APPLIE ROT 10 11.4 M/L (STAPLE) IMPLANT
COVER MAYO STAND STRL (DRAPES) ×2 IMPLANT
COVER SURGICAL LIGHT HANDLE (MISCELLANEOUS) ×2 IMPLANT
DECANTER SPIKE VIAL GLASS SM (MISCELLANEOUS) ×1 IMPLANT
DERMABOND ADVANCED (GAUZE/BANDAGES/DRESSINGS) ×1
DERMABOND ADVANCED .7 DNX12 (GAUZE/BANDAGES/DRESSINGS) ×1 IMPLANT
DRAPE C-ARM 42X120 X-RAY (DRAPES) ×2 IMPLANT
ELECT REM PT RETURN 15FT ADLT (MISCELLANEOUS) ×2 IMPLANT
GLOVE SURG SIGNA 7.5 PF LTX (GLOVE) ×2 IMPLANT
GOWN STRL REUS W/TWL XL LVL3 (GOWN DISPOSABLE) ×6 IMPLANT
HEMOSTAT SURGICEL 4X8 (HEMOSTASIS) IMPLANT
IRRIG SUCT STRYKERFLOW 2 WTIP (MISCELLANEOUS) ×2
IRRIGATION SUCT STRKRFLW 2 WTP (MISCELLANEOUS) ×1 IMPLANT
IV CATH 14GX2 1/4 (CATHETERS) ×2 IMPLANT
IV SET EXTENSION CATH 6 NF (IV SETS) ×2 IMPLANT
KIT BASIN OR (CUSTOM PROCEDURE TRAY) ×2 IMPLANT
POUCH RETRIEVAL ECOSAC 10 (ENDOMECHANICALS) IMPLANT
POUCH RETRIEVAL ECOSAC 10MM (ENDOMECHANICALS) ×1
POUCH SPECIMEN RETRIEVAL 10MM (ENDOMECHANICALS) ×1 IMPLANT
SCISSORS LAP 5X35 DISP (ENDOMECHANICALS) ×2 IMPLANT
SLEEVE ADV FIXATION 5X100MM (TROCAR) ×2 IMPLANT
STOPCOCK 4 WAY LG BORE MALE ST (IV SETS) ×2 IMPLANT
STRIP CLOSURE SKIN 1/4X4 (GAUZE/BANDAGES/DRESSINGS) IMPLANT
SUT MNCRL AB 4-0 PS2 18 (SUTURE) ×2 IMPLANT
SUT VICRYL 0 UR6 27IN ABS (SUTURE) ×1 IMPLANT
SYR 10ML ECCENTRIC (SYRINGE) ×2 IMPLANT
TOWEL OR 17X26 10 PK STRL BLUE (TOWEL DISPOSABLE) ×2 IMPLANT
TOWEL OR NON WOVEN STRL DISP B (DISPOSABLE) ×2 IMPLANT
TRAY LAPAROSCOPIC (CUSTOM PROCEDURE TRAY) ×2 IMPLANT
TROCAR ADV FIXATION 11X100MM (TROCAR) ×1 IMPLANT
TROCAR ADV FIXATION 5X100MM (TROCAR) ×2 IMPLANT
TROCAR XCEL BLUNT TIP 100MML (ENDOMECHANICALS) ×2 IMPLANT
TUBING INSUF HEATED (TUBING) ×2 IMPLANT

## 2017-02-10 NOTE — Interval H&P Note (Signed)
History and Physical Interval Note:  02/10/2017 11:49 AM  Jeremy Shannon  has presented today for surgery, with the diagnosis of Cholecystitis  The various methods of treatment have been discussed with the patient and family.  He came by himself.  No family with him.  Ready for surgery.  After consideration of risks, benefits and other options for treatment, the patient has consented to  Procedure(s): LAPAROSCOPIC CHOLECYSTECTOMY WITH INTRAOPERATIVE CHOLANGIOGRAM (N/A) as a surgical intervention .  The patient's history has been reviewed, patient examined, no change in status, stable for surgery.  I have reviewed the patient's chart and labs.  Questions were answered to the patient's satisfaction.     Robert Sunga H

## 2017-02-10 NOTE — Op Note (Signed)
02/10/2017  2:19 PM  PATIENT:  Jeremy Shannon, 61 y.o., male, MRN: [id number]  PREOP DIAGNOSIS:  Cholecystitis  POSTOP DIAGNOSIS:   Acute and chronic cholecystitis, cholelithiasis  PROCEDURE:   Procedure(s): LAPAROSCOPIC CHOLECYSTECTOMY WITH INTRAOPERATIVE CHOLANGIOGRAM  SURGEON:   Alphonsa Overall, M.D.  ASSISTANTClent Demark, M.D.  ANESTHESIA:   general  Anesthesiologist: Nolon Nations, MD CRNA: Claudia Desanctis, CRNA; Key, Kristopher, CRNA  General  ASA: 2  EBL:  minimal  ml  BLOOD ADMINISTERED: none  DRAINS: none   LOCAL MEDICATIONS USED:   30 cc marcaine  SPECIMEN:   Gall bladder  COUNTS CORRECT:  YES  INDICATIONS FOR PROCEDURE:  Jeremy Shannon is a 61 y.o. (DOB: 1955-12-15) white male whose primary care physician is Corrington, Kip A, MD and comes for cholecystectomy.   The indications and risks of the gall bladder surgery were explained to the patient.  The risks include, but are not limited to, infection, bleeding, common bile duct injury and open surgery.  SURGERY:  The patient was taken to OR room #1 at [facility].  The abdomen was prepped with chloroprep.  The patient was given 2 gm Ancef at the beginning of the operation.   A time out was held and the surgical checklist run.   An infraumbilical incision was made into the abdominal cavity.  A 12 mm Hasson trocar was inserted into the abdominal cavity through the infraumbilical incision and secured with a 0 Vicryl suture.  Three additional trocars were inserted: a 10 mm trocar in the sub-xiphoid location, a 5 mm trocar in the right mid subcostal area, and a 5 mm trocar in the right lateral subcostal area.   The abdomen was explored and the liver, stomach, and bowel that could be seen were unremarkable.   The gall bladder was acutely and chronically inflamed.  It was encased in omentum and the duodenum was pulled up to the wall of the chronically inflamed gall bladder.  His disease was more  chronic.  He had a dense scar involving the gall bladder and surrounding tissue.   I grasped the gall bladder and rotated it cephalad.  Disssection was carried down to the gall bladder/cystic duct junction and the cystic duct isolated.  A clip was placed on the gall bladder side of the cystic duct.   An intra-operative cholangiogram was shot.   The intra-operative cholangiogram was shot using a cut off Taut catheter placed through a 14 gauge angiocath in the RUQ.  The Taut catheter was inserted in the cut cystic duct and secured with an endoclip.  A cholangiogram was shot with 10 cc of 1/2 strength Isoview.  Using fluoroscopy, the cholangiogram showed the flow of contrast into the common bile duct, up the hepatic radicals, and into the duodenum.  There was no mass or obstruction.  This was a normal intra-operative cholangiogram.   The Taut catheter was removed.  The cystic duct was tripley endoclipped and the cystic artery was identified and clipped.  The gall bladder was bluntly and sharpley dissected from the gall bladder bed.  The cap of the gall bladder was intrahepatic, left behind during the dissection, then burned thoroughly with the Bovie at the end of the case.   After the gall bladder was removed from the liver, the gall bladder bed and Triangle of [place] were inspected.  There was no bleeding or bile leak.  The gall bladder was placed in a endocatch bag and delivered through the  umbilicus.  The abdomen was irrigated with 3,000 cc saline.   The trocars were then removed.  I infiltrated 30cc of 1/4% Marcaine into the incisions.  The umbilical port closed with a 0 Vicryl suture times two and the skin closed with 4-0 Monocryl.  The skin was painted with DermaBond.  The patient's sponge and needle count were correct.  The patient was transported to the RR in good condition.  Alphonsa Overall, MD, [facility] Pager: [phone] Office phone:  [phone]

## 2017-02-10 NOTE — Anesthesia Procedure Notes (Signed)
Procedure Name: Intubation Date/Time: 02/10/2017 12:25 PM Performed by: Brannan Cassedy, Virgel Gess Pre-anesthesia Checklist: Patient identified, Emergency Drugs available, Suction available and Patient being monitored Patient Re-evaluated:Patient Re-evaluated prior to inductionOxygen Delivery Method: Circle system utilized Preoxygenation: Pre-oxygenation with 100% oxygen Intubation Type: IV induction Ventilation: Oral airway inserted - appropriate to patient size and Mask ventilation without difficulty Laryngoscope Size: Mac and 4 Grade View: Grade I Tube type: Oral Tube size: 7.5 mm Number of attempts: 1 Airway Equipment and Method: Stylet Placement Confirmation: ETT inserted through vocal cords under direct vision,  positive ETCO2 and breath sounds checked- equal and bilateral Secured at: 22 cm Tube secured with: Tape Dental Injury: Teeth and Oropharynx as per pre-operative assessment

## 2017-02-10 NOTE — Anesthesia Postprocedure Evaluation (Signed)
Anesthesia Post Note  Patient: Jeremy Shannon  Procedure(s) Performed: Procedure(s) (LRB): LAPAROSCOPIC CHOLECYSTECTOMY WITH INTRAOPERATIVE CHOLANGIOGRAM (N/A)     Patient location during evaluation: PACU Anesthesia Type: General Level of consciousness: sedated and patient cooperative Pain management: pain level controlled Vital Signs Assessment: post-procedure vital signs reviewed and stable Respiratory status: spontaneous breathing Cardiovascular status: stable Anesthetic complications: no    Last Vitals:  Vitals:   02/10/17 1530 02/10/17 1600  BP: 107/71 119/76  Pulse: 68 71  Resp: 10 15  Temp: 36.5 C 36.4 C    Last Pain:  Vitals:   02/10/17 1613  TempSrc:   PainSc: [place]

## 2017-02-10 NOTE — Anesthesia Preprocedure Evaluation (Signed)
Anesthesia Evaluation  Patient identified by MRN, date of birth, ID band Patient awake    Reviewed: Allergy & Precautions, NPO status , Patient's Chart, lab work & pertinent test results  History of Anesthesia Complications (+) PONV and history of anesthetic complications  Airway Mallampati: II  TM Distance: >3 FB Neck ROM: Full    Dental no notable dental hx.    Pulmonary neg pulmonary ROS, Current Smoker,    Pulmonary exam normal breath sounds clear to auscultation       Cardiovascular negative cardio ROS Normal cardiovascular exam Rhythm:Regular Rate:Normal     Neuro/Psych PSYCHIATRIC DISORDERS Anxiety Depression negative neurological ROS  negative psych ROS   GI/Hepatic negative GI ROS, Neg liver ROS,   Endo/Other  diabetes  Renal/GU negative Renal ROS     Musculoskeletal negative musculoskeletal ROS (+)   Abdominal (+) + obese,   Peds  Hematology negative hematology ROS (+) anemia ,   Anesthesia Other Findings   Reproductive/Obstetrics negative OB ROS                             Anesthesia Physical Anesthesia Plan  ASA: II  Anesthesia Plan: General   Post-op Pain Management:    Induction: Intravenous  Airway Management Planned: Oral ETT  Additional Equipment:   Intra-op Plan:   Post-operative Plan: Extubation in OR  Informed Consent: I have reviewed the patients History and Physical, chart, labs and discussed the procedure including the risks, benefits and alternatives for the proposed anesthesia with the patient or authorized representative who has indicated his/her understanding and acceptance.   Dental advisory given  Plan Discussed with: CRNA  Anesthesia Plan Comments:         Anesthesia Quick Evaluation

## 2017-02-10 NOTE — Transfer of Care (Signed)
Immediate Anesthesia Transfer of Care Note  Patient: Jeremy Shannon  Procedure(s) Performed: Procedure(s): LAPAROSCOPIC CHOLECYSTECTOMY WITH INTRAOPERATIVE CHOLANGIOGRAM (N/A)  Patient Location: PACU  Anesthesia Type:General  Level of Consciousness:  sedated, patient cooperative and responds to stimulation  Airway & Oxygen Therapy:Patient Spontanous Breathing and Patient connected to face mask oxgen  Post-op Assessment:  Report given to PACU RN and Post -op Vital signs reviewed and stable  Post vital signs:  Reviewed and stable  Last Vitals:  Vitals:   02/10/17 1043  BP: 116/77  Pulse: 88  Resp: 16  Temp: 03.2 C    Complications: No apparent anesthesia complications

## 2017-02-11 ENCOUNTER — Encounter (HOSPITAL_COMMUNITY): Payer: Self-pay | Admitting: Surgery

## 2017-02-11 DIAGNOSIS — K8012 Calculus of gallbladder with acute and chronic cholecystitis without obstruction: Secondary | ICD-10-CM | POA: Diagnosis not present

## 2017-02-11 LAB — GLUCOSE, CAPILLARY: GLUCOSE-CAPILLARY: 193 mg/dL — AB (ref 65–99)

## 2017-02-11 MED ORDER — HYDROCODONE-ACETAMINOPHEN 5-325 MG PO TABS: 1.0000 | ORAL_TABLET | ORAL | 0 refills | Status: DC | PRN

## 2017-02-11 NOTE — Care Management Note (Signed)
Case Management Note  Patient Details  Name: Jeremy Shannon MRN: [id number] Date of Birth: 1956-03-26  Subjective/Objective:  S/p lap cholecystectomy 02/10/2017                  Action/Plan: Discharge Planning: Chart reviewed. No NCM needs identified.   PCP Ledora Bottcher A MD  Expected Discharge Date:  02/11/17               Expected Discharge Plan:  Home/Self Care  In-House Referral:  NA  Discharge planning Services  CM Consult  Post Acute Care Choice:  NA Choice offered to:  NA  DME Arranged:  N/A DME Agency:  NA  HH Arranged:  NA HH Agency:  NA  Status of Service:  Completed, signed off  If discussed at [place] of Stay Meetings, dates discussed:    Additional Comments:  Erenest Rasher, RN 02/11/2017, 10:21 AM

## 2017-02-11 NOTE — Discharge Summary (Signed)
Physician Discharge Summary  Patient ID: Jeremy Shannon MRN: [id number] DOB/AGE: Apr 27, 1956  61 y.o.  Admit date: 02/10/2017 Discharge date:   Patient Care Team: Corrington, Delsa Grana, MD as PCP - General (Family Medicine) Alphonsa Overall, MD as Consulting Physician (General Surgery)  Discharge Diagnoses:  Principal Problem:   Acute cholecystitis with chronic cholecystitis s/p lap cholecystectomy 02/10/2017   POST-OPERATIVE DIAGNOSIS:   Cholecystitis  SURGERY:  02/10/2017  Procedure(s): LAPAROSCOPIC CHOLECYSTECTOMY WITH INTRAOPERATIVE CHOLANGIOGRAM  SURGEON:    Surgeon(s): Alphonsa Overall, MD Armandina Gemma, MD  Consults: None  Hospital Course:   The patient underwent the surgery above.  Postoperatively, the patient gradually mobilized and advanced to a solid diet.  Pain and other symptoms were treated aggressively.    By the time of discharge, the patient was walking well the hallways, eating food, having flatus.  Pain was well-controlled on an oral medications.  Based on meeting discharge criteria and continuing to recover, I felt it was safe for the patient to be discharged from the hospital to further recover with close followup. Postoperative recommendations were discussed in detail.  They are written as well.  Discharged Condition: good  Disposition:  Follow-up Information    Alphonsa Overall, MD. Schedule an appointment as soon as possible for a visit in 3 week(s).   Specialty:  General Surgery Contact information: [place] [address] [phone]           01-Home or Self Care  Discharge Instructions    Call MD for:    Complete by:  As directed    FEVER > 101.5 F  (temperatures < 101.5 F are not significant)   Call MD for:  extreme fatigue    Complete by:  As directed    Call MD for:  persistant dizziness or light-headedness    Complete by:  As directed    Call MD for:  persistant nausea and vomiting    Complete by:  As  directed    Call MD for:  redness, tenderness, or signs of infection (pain, swelling, redness, odor or green/yellow discharge around incision site)    Complete by:  As directed    Call MD for:  severe uncontrolled pain    Complete by:  As directed    Diet - low sodium heart healthy    Complete by:  As directed    Follow a light diet the first few days at home.   Start with a bland diet such as soups, liquids, starchy foods, low fat foods, etc.   If you feel full, bloated, or constipated, stay on a full liquid or pureed/blenderized diet for a few days until you feel better and no longer constipated. Be sure to drink plenty of fluids every day to avoid getting dehydrated (feeling dizzy, not urinating, etc.). Gradually add a fiber supplement to your diet   Discharge instructions    Complete by:  As directed    See Discharge Instructions If you are not getting better after two weeks or are noticing you are getting worse, contact our office [phone] for further advice.  We may need to adjust your medications, re-evaluate you in the office, send you to the emergency room, or see what other things we can do to help. The clinic staff is available to answer your questions during regular business hours (8:30am-5pm).  Please don't hesitate to call and ask to speak to one of our nurses for clinical concerns.    A  surgeon from [facility] is always on call at the hospitals 24 hours/day If you have a medical emergency, go to the nearest emergency room or call 911.   Driving Restrictions    Complete by:  As directed    You may drive when you are no longer taking narcotic prescription pain medication, you can comfortably wear a seatbelt, and you can safely make sudden turns/stops to protect yourself without hesitating due to pain.   Increase activity slowly    Complete by:  As directed    Start light daily activities --- self-care, walking, climbing stairs- beginning the day after  surgery.  Gradually increase activities as tolerated.  Control your pain to be active.  Stop when you are tired.  Ideally, walk several times a day, eventually an hour a day.   Most people are back to most day-to-day activities in a few weeks.  It takes 4-8 weeks to get back to unrestricted, intense activity. If you can walk 30 minutes without difficulty, it is safe to try more intense activity such as jogging, treadmill, bicycling, low-impact aerobics, swimming, etc. Save the most intensive and strenuous activity for last (Usually 4-8 weeks after surgery) such as sit-ups, heavy lifting, contact sports, etc.  Refrain from any intense heavy lifting or straining until you are off narcotics for pain control.  You will have off days, but things should improve week-by-week. DO NOT PUSH THROUGH PAIN.  Let pain be your guide: If it hurts to do something, don't do it.  Pain is your body warning you to avoid that activity for another week until the pain goes down.   Lifting restrictions    Complete by:  As directed    If you can walk 30 minutes without difficulty, it is safe to try more intense activity such as jogging, treadmill, bicycling, low-impact aerobics, swimming, etc. Save the most intensive and strenuous activity for last (Usually 4-8 weeks after surgery) such as sit-ups, heavy lifting, contact sports, etc.  Refrain from any intense heavy lifting or straining until you are off narcotics for pain control.  You will have off days, but things should improve week-by-week. DO NOT PUSH THROUGH PAIN.  Let pain be your guide: If it hurts to do something, don't do it.  Pain is your body warning you to avoid that activity for another week until the pain goes down.   May walk up steps    Complete by:  As directed    No wound care    Complete by:  As directed    It is good for closed incision and even open wounds to be washed every day.  Shower every day.  Short baths are fine.  Wash the incisions and wounds  clean with soap & water.    If you have a closed incision(s), wash the incision with soap & water every day.  You may leave closed incisions open to air if it is dry.   You may cover the incision with clean gauze & replace it after your daily shower for comfort. If you have skin tapes (Steristrips) or skin glue (Dermabond) on your incision, leave them in place.  They will fall off on their own like a scab.  You may trim any edges that curl up with clean scissors.  If you have staples, set up an appointment for them to be removed in the office in 10 days after surgery.  If you have a drain, wash around the skin exit site  with soap & water and place a new dressing of gauze or band aid around the skin every day.  Keep the drain site clean & dry.   Sexual Activity Restrictions    Complete by:  As directed    You may have sexual intercourse when it is comfortable. If it hurts to do something, stop.      Allergies as of 02/11/2017   No Known Allergies     Medication List    TAKE these medications   ALPRAZolam 1 MG tablet Commonly known as:  XANAX Take 1 mg by mouth 4 (four) times daily as needed for anxiety.   fluvoxaMINE 100 MG tablet Commonly known as:  LUVOX Take 200 mg by mouth at bedtime.   HYDROcodone-acetaminophen 5-325 MG tablet Commonly known as:  NORCO/VICODIN Take 1-2 tablets by mouth every 4 (four) hours as needed for moderate pain.   metFORMIN 500 MG tablet Commonly known as:  GLUCOPHAGE Take 500 mg by mouth 2 (two) times daily.   ondansetron 4 MG disintegrating tablet Commonly known as:  ZOFRAN ODT Take 1 tablet (4 mg total) by mouth every 8 (eight) hours as needed for nausea or vomiting.   QUEtiapine 100 MG tablet Commonly known as:  SEROQUEL Take 300 mg by mouth at bedtime.   simvastatin 20 MG tablet Commonly known as:  ZOCOR Take 20 mg by mouth at bedtime.       Significant Diagnostic Studies:  Results for orders placed or performed during the hospital  encounter of 02/10/17 (from the past 72 hour(s))  Glucose, capillary     Status: Abnormal   Collection Time: 02/10/17 10:36 AM  Result Value Ref Range   Glucose-Capillary 121 (H) 65 - 99 mg/dL  Glucose, capillary     Status: Abnormal   Collection Time: 02/11/17  6:21 AM  Result Value Ref Range   Glucose-Capillary 193 (H) 65 - 99 mg/dL    Dg Cholangiogram Operative  Result Date: 02/10/2017 CLINICAL DATA:  Intraoperative cholangiogram during laparoscopic cholecystectomy. EXAM: INTRAOPERATIVE CHOLANGIOGRAM FLUOROSCOPY TIME:  12 seconds COMPARISON:  Right upper quadrant abdominal ultrasound - 02/05/2017; CT abdomen pelvis - 02/04/2017 FINDINGS: Intraoperative cholangiographic images of the right upper abdominal quadrant during laparoscopic cholecystectomy are provided for review. Surgical clips overlie the expected location of the gallbladder fossa. Contrast injection demonstrates selective cannulation of the central aspect of the cystic duct. There is passage of contrast through the central aspect of the cystic duct with filling of a non dilated common bile duct. There is passage of contrast though the CBD and into the descending portion of the duodenum. There is minimal reflux of injected contrast into the common hepatic duct and central aspect of the non dilated intrahepatic biliary system. There are no discrete filling defects within the opacified portions of the biliary system to suggest the presence of choledocholithiasis. IMPRESSION: No evidence of choledocholithiasis. Electronically Signed   By: Sandi Mariscal M.D.   On: 02/10/2017 14:10    Discharge Exam: Blood pressure 104/72, pulse 68, temperature 98.5 F (36.9 C), temperature source Oral, resp. rate 16, height 5\' 8"  (1.727 m), weight 97.5 kg (215 lb), SpO2 90 %.  General: Pt awake/alert/oriented x4 in No acute distress Eyes: PERRL, normal EOM.  Sclera clear.  No icterus Neuro: CN II-XII intact w/o focal sensory/motor deficits. Lymph: No  head/neck/groin lymphadenopathy Psych:  No delerium/psychosis/paranoia HENT: Normocephalic, Mucus membranes moist.  No thrush Neck: Supple, No tracheal deviation Chest: No chest wall pain w good excursion CV:  Pulses intact.  Regular rhythm MS: Normal AROM mjr joints.  No obvious deformity Abdomen: Soft.  Nondistended.  Mildly tender at incisions only.  No evidence of peritonitis.  No incarcerated hernias. Ext:  SCDs BLE.  No mjr edema.  No cyanosis Skin: No petechiae / purpura  Past Medical History:  Diagnosis Date  . Anemia   . Anxiety   . Cataract   . Depression   . Diabetes mellitus without complication ([place])    type 2  . Hyperlipidemia   . PONV (postoperative nausea and vomiting)     Past Surgical History:  Procedure Laterality Date  . CATARACT EXTRACTION     bilateral  . CHOLECYSTECTOMY N/A 02/10/2017   Procedure: LAPAROSCOPIC CHOLECYSTECTOMY WITH INTRAOPERATIVE CHOLANGIOGRAM;  Surgeon: Alphonsa Overall, MD;  Location: [facility];  Service: General;  Laterality: N/A;    Social History   Social History  . Marital status: Legally Separated    Spouse name: N/A  . Number of children: N/A  . Years of education: N/A   Occupational History  . Not on file.   Social History Main Topics  . Smoking status: Current Every Day Smoker    Packs/day: 1.00    Years: 43.00    Types: Cigarettes  . Smokeless tobacco: Never Used  . Alcohol use No  . Drug use: No  . Sexual activity: No   Other Topics Concern  . Not on file   Social History Narrative  . No narrative on file    History reviewed. No pertinent family history.  Current Facility-Administered Medications  Medication Dose Route Frequency Provider Last Rate Last Dose  . ALPRAZolam Duanne Moron) tablet 1 mg  1 mg Oral QID PRN Alphonsa Overall, MD   1 mg at 02/11/17 0647  . cefTRIAXone (ROCEPHIN) 2 g in dextrose 5 % 50 mL IVPB  2 g Intravenous Q24H Alphonsa Overall, MD   Stopped at 02/10/17 1829  . dextrose 5 % and 0.45 % NaCl with  KCl 20 mEq/L infusion   Intravenous Continuous Alphonsa Overall, MD 75 mL/hr at 02/10/17 2004    . heparin injection 5,000 Units  5,000 Units Subcutaneous Q8H Alphonsa Overall, MD   5,000 Units at 02/11/17 [phone]  . HYDROcodone-acetaminophen (NORCO/VICODIN) 5-325 MG per tablet 1-2 tablet  1-2 tablet Oral Q4H PRN Alphonsa Overall, MD   2 tablet at 02/10/17 2150  . ibuprofen (ADVIL,MOTRIN) tablet 600 mg  600 mg Oral Q6H PRN Alphonsa Overall, MD   600 mg at 02/11/17 9811  . morphine 2 MG/ML injection 1-4 mg  1-4 mg Intravenous Q2H PRN Alphonsa Overall, MD   4 mg at 02/10/17 2331  . ondansetron (ZOFRAN-ODT) disintegrating tablet 4 mg  4 mg Oral Q6H PRN Alphonsa Overall, MD       Or  . ondansetron [facility]) injection 4 mg  4 mg Intravenous Q6H PRN Alphonsa Overall, MD      . QUEtiapine (SEROQUEL) tablet 300 mg  300 mg Oral QHS PRN Alphonsa Overall, MD         No Known Allergies  Signed: Morton Peters, M.D., F.A.C.S. Gastrointestinal and Minimally Invasive Surgery [facility] [address], [place], [place] [address] [phone] Main / Paging   02/11/2017, 8:05 AM

## 2017-02-11 NOTE — Discharge Instructions (Signed)
[facility] - DISCHARGE INSTRUCTIONS TO PATIENT  Activity:  Driving - May drive in 2 - 4 days, if off pain meds and doing okay.   Lifting - No lifting 15 pounds for one week, then no limit  Wound Care:   May shower starting Sunday, 02/12/2017  Diet:  As tolerated  Follow up appointment:  Call Dr. Pollie Friar office [facility]) at [phone] for an appointment in 2 to 3 weeks.  Medications and dosages:  Resume your home medications.  You have a prescription for:  Vicodin  Call Dr. Lucia Gaskins or his office  [phone]) if you have:  Temperature greater than 100.4,  Persistent nausea and vomiting,  Severe uncontrolled pain,  Redness, tenderness, or signs of infection (pain, swelling, redness, odor or green/yellow discharge around the site),  Any other questions or concerns you may have after discharge.  In an emergency, call 911 or go to an Emergency Department at a nearby hospital.   LAPAROSCOPIC SURGERY: POST OP INSTRUCTIONS  ######################################################################  EAT Gradually transition to a high fiber diet with a fiber supplement over the next few weeks after discharge.  Start with a pureed / full liquid diet (see below)  WALK Walk an hour a day.  Control your pain to do that.    CONTROL PAIN Control pain so that you can walk, sleep, tolerate sneezing/coughing, go up/down stairs.  HAVE A BOWEL MOVEMENT DAILY Keep your bowels regular to avoid problems.  OK to try a laxative to override constipation.  OK to use an antidairrheal to slow down diarrhea.  Call if not better after 2 tries  CALL IF YOU HAVE PROBLEMS/CONCERNS Call if you are still struggling despite following these instructions. Call if you have concerns not answered by these instructions  ######################################################################    1. DIET: Follow a light bland diet the first 24 hours after arrival home, such as  soup, liquids, crackers, etc.  Be sure to include lots of fluids daily.  Avoid fast food or heavy meals as your are more likely to get nauseated.  Eat a low fat the next few days after surgery.   2. Take your usually prescribed home medications unless otherwise directed. 3. PAIN CONTROL: a. Pain is best controlled by a usual combination of three different methods TOGETHER: i. Ice/Heat ii. Over the counter pain medication iii. Prescription pain medication b. Most patients will experience some swelling and bruising around the incisions.  Ice packs or heating pads (30-60 minutes up to 6 times a day) will help. Use ice for the first few days to help decrease swelling and bruising, then switch to heat to help relax tight/sore spots and speed recovery.  Some people prefer to use ice alone, heat alone, alternating between ice & heat.  Experiment to what works for you.  Swelling and bruising can take several weeks to resolve.   c. It is helpful to take an over-the-counter pain medication regularly for the first few weeks.  Choose one of the following that works best for you: i. Naproxen (Aleve, etc)  Two 220mg  tabs twice a day ii. Ibuprofen (Advil, etc) Three 200mg  tabs four times a day (every meal & bedtime) iii. Acetaminophen (Tylenol, etc) 500-650mg  four times a day (every meal & bedtime) d. A  prescription for pain medication (such as oxycodone, hydrocodone, etc) should be given to you upon discharge.  Take your pain medication as prescribed.  i. If you are having problems/concerns with the prescription medicine (does not control pain, nausea,  vomiting, rash, itching, etc), please call us [phone] to see if we need to switch you to a different pain medicine that will work better for you and/or control your side effect better. ii. If you need a refill on your pain medication, please contact your pharmacy.  They will contact our office to request authorization. Prescriptions will not be filled after 5  pm or on week-ends. 4. Avoid getting constipated.  Between the surgery and the pain medications, it is common to experience some constipation.  Increasing fluid intake and taking a fiber supplement (such as Metamucil, Citrucel, FiberCon, MiraLax, etc) 1-2 times a day regularly will usually help prevent this problem from occurring.  A mild laxative (prune juice, Milk of Magnesia, MiraLax, etc) should be taken according to package directions if there are no bowel movements after 48 hours.   5. Watch out for diarrhea.  If you have many loose bowel movements, simplify your diet to bland foods & liquids for a few days.  Stop any stool softeners and decrease your fiber supplement.  Switching to mild anti-diarrheal medications (Kayopectate, Pepto Bismol) can help.  If this worsens or does not improve, please call us. 6. Wash / shower every day.  You may shower over the dressings as they are waterproof.  Continue to shower over incision(s) after the dressing is off. 7. Remove your waterproof bandages 5 days after surgery.  You may leave the incision open to air.  You may replace a dressing/Band-Aid to cover the incision for comfort if you wish.  8. ACTIVITIES as tolerated:   a. You may resume regular (light) daily activities beginning the next day--such as daily self-care, walking, climbing stairs--gradually increasing activities as tolerated.  If you can walk 30 minutes without difficulty, it is safe to try more intense activity such as jogging, treadmill, bicycling, low-impact aerobics, swimming, etc. b. Save the most intensive and strenuous activity for last such as sit-ups, heavy lifting, contact sports, etc  Refrain from any heavy lifting or straining until you are off narcotics for pain control.   c. DO NOT PUSH THROUGH PAIN.  Let pain be your guide: If it hurts to do something, don't do it.  Pain is your body warning you to avoid that activity for another week until the pain goes down. d. You may drive when  you are no longer taking prescription pain medication, you can comfortably wear a seatbelt, and you can safely maneuver your car and apply brakes. e. Dennis Bast may have sexual intercourse when it is comfortable.  9. FOLLOW UP in our office a. Please call [facility] at [phone] to set up an appointment to see your surgeon in the office for a follow-up appointment approximately 2-3 weeks after your surgery. b. Make sure that you call for this appointment the day you arrive home to insure a convenient appointment time. 10. IF YOU HAVE DISABILITY OR FAMILY LEAVE FORMS, BRING THEM TO THE OFFICE FOR PROCESSING.  DO NOT GIVE THEM TO YOUR DOCTOR.   WHEN TO CALL us [phone]: 1. Poor pain control 2. Reactions / problems with new medications (rash/itching, nausea, etc)  3. Fever over 101.5 F (38.5 C) 4. Inability to urinate 5. Nausea and/or vomiting 6. Worsening swelling or bruising 7. Continued bleeding from incision. 8. Increased pain, redness, or drainage from the incision   The clinic staff is available to answer your questions during regular business hours (8:30am-5pm).  Please dont hesitate to call and ask to speak to one  of our nurses for clinical concerns.   If you have a medical emergency, go to the nearest emergency room or call 911.  A surgeon from [facility] is always on call at the [facility], [place], [place], [place], [place]  [address] ? MAIN: [phone] ? TOLL FREE: [phone] ?  FAX [phone] [email or web address]     Cholecystitis Cholecystitis is inflammation of the gallbladder. It is often called a gallbladder attack. The gallbladder is a pear-shaped organ that lies beneath the liver on the right side of the body. The gallbladder stores bile, which is a fluid that helps the body to digest fats. If bile builds up in your gallbladder, your gallbladder becomes inflamed. This condition may occur  suddenly (be acute). Repeat episodes of acute cholecystitis or prolonged episodes may lead to a long-term (chronic) condition. Cholecystitis is serious and it requires treatment. What are the causes? The most common cause of this condition is gallstones. Gallstones can block the tube (duct) that carries bile out of your gallbladder. This causes bile to build up. Other causes of this condition include:  Damage to the gallbladder due to a decrease in blood flow.  Infections in the bile ducts.  Scars or kinks in the bile ducts.  Tumors in the liver, pancreas, or gallbladder.  What increases the risk? This condition is more likely to develop in:  People who have sickle cell disease.  People who take birth control pills or use estrogen.  People who have alcoholic liver disease.  People who have liver cirrhosis.  People who have their nutrition delivered through a vein (parenteral nutrition).  People who do not eat or drink (do fasting) for a long period of time.  People who are obese.  People who have rapid weight loss.  People who are pregnant.  People who have increased triglyceride levels.  People who have pancreatitis.  What are the signs or symptoms? Symptoms of this condition include:  Abdominal pain, especially in the upper right area of the abdomen.  Abdominal tenderness or bloating.  Nausea.  Vomiting.  Fever.  Chills.  Yellowing of the skin and the whites of the eyes (jaundice).  How is this diagnosed? This condition is diagnosed with a medical history and physical exam. You may also have other tests, including:  Imaging tests, such as: ? An ultrasound of the gallbladder. ? A CT scan of the abdomen. ? A gallbladder nuclear scan (HIDA scan). This scan allows your health care provider to see the bile moving from your liver to your gallbladder and to your small intestine. ? MRI.  Blood tests, such as: ? A complete blood count, because the white blood  cell count may be higher than normal. ? Liver function tests, because some levels may be higher than normal with certain types of gallstones.  How is this treated? Treatment may include:  Fasting for a certain amount of time.  IV fluids.  Medicine to treat pain or vomiting.  Antibiotic medicine.  Surgery to remove your gallbladder (cholecystectomy). This may happen immediately or at a later time.  Follow these instructions at home: Home care will depend on your treatment. In general:  Take over-the-counter and prescription medicines only as told by your health care provider.  If you were prescribed an antibiotic medicine, take it as told by your health care provider. Do not stop taking the antibiotic even if you start to feel better.  Follow instructions from  your health care provider about what to eat or drink. When you are allowed to eat, avoid eating or drinking anything that triggers your symptoms.  Keep all follow-up visits as told by your health care provider. This is important.  Contact a health care provider if:  Your pain is not controlled with medicine.  You have a fever. Get help right away if:  Your pain moves to another part of your abdomen or to your back.  You continue to have symptoms or you develop new symptoms even with treatment. This information is not intended to replace advice given to you by your health care provider. Make sure you discuss any questions you have with your health care provider. Document Released: 08/29/2005 Document Revised: 01/07/2016 Document Reviewed: 12/10/2014 Elsevier Interactive Patient Education  2017 [employer].

## 2017-02-13 ENCOUNTER — Encounter: Payer: Self-pay | Admitting: *Deleted

## 2017-02-13 ENCOUNTER — Telehealth: Payer: Self-pay | Admitting: *Deleted

## 2017-02-13 NOTE — Telephone Encounter (Signed)
Pt returned my call and he was made aware of his stress test results and he verbalized understanding.

## 2017-02-20 ENCOUNTER — Ambulatory Visit (INDEPENDENT_AMBULATORY_CARE_PROVIDER_SITE_OTHER): Payer: Medicare Other | Admitting: Physician Assistant

## 2017-02-20 ENCOUNTER — Encounter: Payer: Self-pay | Admitting: Physician Assistant

## 2017-02-20 VITALS — BP 92/70 | HR 69 | Ht 68.0 in | Wt 212.8 lb

## 2017-02-20 DIAGNOSIS — R001 Bradycardia, unspecified: Secondary | ICD-10-CM | POA: Diagnosis not present

## 2017-02-20 DIAGNOSIS — R0789 Other chest pain: Secondary | ICD-10-CM

## 2017-02-20 NOTE — Progress Notes (Signed)
Cardiology Office Note   Date:  02/20/2017   ID:  Jeremy Shannon, DOB 15-Aug-1956, MRN [id number]  PCP:  Curly Rim, MD  Cardiologist:  Dr. Sallyanne Kuster in-hospital consult 01/24/2017  Anacleto Batterman, Suanne Marker, PA-C    History of Present Illness: Jeremy Shannon is a 61 y.o. male with a history of  HLD, DM, anxiety, tobacco smoking and depression.  01/24/2017 seen in the hospital for chest pain; also noted to have sinus bradycardia with heart rates in the 40s and 50s, no AV blocking agents on board and avoidance of these drugs recommended. Outpatient stress test performed 05/23 and was low risk. 02/04/2017, patient admitted for flank pain and found to have cholecystitis, discharged after laparoscopic cholecystectomy on 02/11/2017  Jeremy Shannon presents for post-hospital follow up.   He was really miserable with his gallbladder, has done much better since it was removed. He has been eating soup, broth and salads. He had some problems with diarrhea and then constipation after the surgery, that has gotten better with Miralax.  No more chest pain, no pain at all.   He is getting stronger, getting back to normal. He is taking 30-45 minute walks around the neighborhood. No breathing issues. No LE edema.   He has not had chest pain with exertion. He does not feel limited in his exertion by any cardiac symptoms.    He has not had palpitations. He has not been light-headed or dizzy.   His SBP is 92, but he denies orthostatic symptoms. He has had 2 bottles of water today and had some lunch. He admits he is not eating as much as previously. He is asymptomatic with this BP.    Past Medical History:  Diagnosis Date  . Anemia   . Anxiety   . Cataract   . Depression   . Diabetes mellitus without complication ([place])    type 2  . Hyperlipidemia   . PONV (postoperative nausea and vomiting)     Past Surgical History:  Procedure Laterality Date  . CATARACT EXTRACTION     bilateral    . CHOLECYSTECTOMY N/A 02/10/2017   Procedure: LAPAROSCOPIC CHOLECYSTECTOMY WITH INTRAOPERATIVE CHOLANGIOGRAM;  Surgeon: Alphonsa Overall, MD;  Location: [facility];  Service: General;  Laterality: N/A;    Current Outpatient Prescriptions  Medication Sig Dispense Refill  . ALPRAZolam (XANAX) 1 MG tablet Take 1 mg by mouth 4 (four) times daily as needed for anxiety.    . fluvoxaMINE (LUVOX) 100 MG tablet Take 200 mg by mouth at bedtime.     . metFORMIN (GLUCOPHAGE) 500 MG tablet Take 500 mg by mouth 2 (two) times daily.     . ondansetron (ZOFRAN ODT) 4 MG disintegrating tablet Take 1 tablet (4 mg total) by mouth every 8 (eight) hours as needed for nausea or vomiting. 20 tablet 0  . QUEtiapine (SEROQUEL) 100 MG tablet Take 300 mg by mouth at bedtime.     . simvastatin (ZOCOR) 20 MG tablet Take 20 mg by mouth at bedtime.      No current facility-administered medications for this visit.     Allergies:   Patient has no known allergies.    Social History:  The patient  reports that he has been smoking Cigarettes.  He has a 43.00 pack-year smoking history. He has never used smokeless tobacco. He reports that he does not drink alcohol or use drugs.   Family History:  The patient's family history is not on file.    ROS:  Please see the history of present illness. All other systems are reviewed and negative.    PHYSICAL EXAM: VS:  BP 92/70   Pulse 69   Ht 5\' 8"  (1.727 m)   Wt 212 lb 12.8 oz (96.5 kg)   SpO2 95%   BMI 32.36 kg/m  , BMI Body mass index is 32.36 kg/m. GEN: Well nourished, well developed, male in no acute distress  HEENT: normal for age  Neck: no JVD, no carotid bruit, no masses Cardiac: RRR; no murmur, no rubs, or gallops Respiratory: slightly decreased BS bases bilaterally, normal work of breathing GI: soft, nontender, nondistended, + BS MS: no deformity or atrophy; no edema; distal pulses are 2+ in all 4 extremities   Skin: warm and dry, no rash Neuro:  Strength and  sensation are intact Psych: euthymic mood, full affect   EKG:  EKG is not ordered today.  MYOVIEW: 02/01/2017  The left ventricular ejection fraction is mildly decreased (45-54%).  Nuclear stress EF: 53%.  There was no ST segment deviation noted during stress.  No T wave inversion was noted during stress.  The study is normal.  This is a low risk study.   Recent Labs: 01/23/2017: B Natriuretic Peptide 29.3 02/08/2017: ALT 25; BUN 21; Creatinine, Ser 1.10; Hemoglobin 12.5; Platelets 292; Potassium 4.4; Sodium 139    Lipid Panel No results found for: CHOL, TRIG, HDL, CHOLHDL, VLDL, LDLCALC, LDLDIRECT   Wt Readings from Last 3 Encounters:  02/20/17 212 lb 12.8 oz (96.5 kg)  02/10/17 215 lb (97.5 kg)  02/08/17 215 lb (97.5 kg)     Other studies Reviewed: Additional studies/ records that were reviewed today include: Hospital records and testing.  ASSESSMENT AND PLAN:  1.  Chest pain: Cardiac enzymes were negative for MI and Myoview was without ischemia. No ongoing chest pain or other ischemic symptoms. No further cardiac workup at this time.  2. Bradycardia: His resting heart rate is in the 50s and sometimes in the 40s. He is asymptomatic with this. I explained that there was no indication to do anything about it and he is comfortable with this.  3. Hypertension: His systolic blood pressures in the 90s today. He is not on any blood pressure lowering medications. I advised him that he needed to make sure he had good by mouth intake and especially make sure he drinks adequate water. He was given water prior to the office and felt fine.  Current medicines are reviewed at length with the patient today.  The patient does not have concerns regarding medicines.  The following changes have been made:  no change  Labs/ tests ordered today include:  No orders of the defined types were placed in this encounter.    Disposition:   FU with Dr. Sallyanne Kuster  Signed, Rosaria Ferries,  PA-C  02/20/2017 3:14 PM    [place] Phone: [phone]; Fax: [phone]  This note was written with the assistance of speech recognition software. Please excuse any transcriptional errors.

## 2017-02-20 NOTE — Patient Instructions (Addendum)
Medication Instructions:  Your physician recommends that you continue on your current medications as directed. Please refer to the Current Medication list given to you today.  Labwork: NONE  Testing/Procedures: NONE  Follow-Up: Your physician wants you to follow-up in: 6 MONTHS with Dr. Sallyanne Kuster. You will receive a reminder letter in the mail two months in advance. If you don't receive a letter, please call our office to schedule the follow-up appointment.   Any Other Special Instructions Will Be Listed Below (If Applicable).     If you need a refill on your cardiac medications before your next appointment, please call your pharmacy.

## 2017-02-21 ENCOUNTER — Telehealth: Payer: Self-pay | Admitting: Physician Assistant

## 2017-02-21 NOTE — Telephone Encounter (Signed)
Patient calling, states that he had gall bladder removed and at his most recent visit with Rosaria Ferries, he was instructed not to eat fatty foods. Patient would like more details on what "fatty foods" consists of? Thanks.

## 2017-02-21 NOTE — Telephone Encounter (Signed)
Patient provided info on low-fat diet (resource from patient care tools). Mailed to patient.

## 2017-07-05 ENCOUNTER — Telehealth: Payer: Self-pay | Admitting: Physician Assistant

## 2017-07-05 NOTE — Telephone Encounter (Signed)
Closed Encounter  °

## 2017-07-07 ENCOUNTER — Ambulatory Visit: Payer: Medicare Other | Admitting: Physician Assistant

## 2017-07-10 ENCOUNTER — Encounter: Payer: Self-pay | Admitting: Physician Assistant

## 2017-07-10 ENCOUNTER — Ambulatory Visit (INDEPENDENT_AMBULATORY_CARE_PROVIDER_SITE_OTHER): Payer: Medicare Other | Admitting: Physician Assistant

## 2017-07-10 VITALS — BP 92/65 | HR 60 | Ht 68.0 in | Wt 205.0 lb

## 2017-07-10 DIAGNOSIS — E119 Type 2 diabetes mellitus without complications: Secondary | ICD-10-CM | POA: Diagnosis not present

## 2017-07-10 DIAGNOSIS — I959 Hypotension, unspecified: Secondary | ICD-10-CM

## 2017-07-10 DIAGNOSIS — Z720 Tobacco use: Secondary | ICD-10-CM

## 2017-07-10 DIAGNOSIS — E785 Hyperlipidemia, unspecified: Secondary | ICD-10-CM | POA: Diagnosis not present

## 2017-07-10 NOTE — Progress Notes (Signed)
Cardiology Office Note    Date:  07/12/2017   ID:  SOHUM Shannon, DOB 06-08-1956, MRN [id number]  PCP:  Curly Rim, MD  Cardiologist:  Dr. Sallyanne Shannon   Chief Complaint  Patient presents with  . Follow-up    seen for Dr. Sallyanne Shannon    History of Present Illness:  Jeremy Shannon is a 61 y.o. male with PMH of HLD, DM II, anxiety, tobacco abuse and depression. He was seen in the hospital for chest pain in May 2018, and noted to be in sinus bradycardia with heart rate of 40s to 50s, no AV blocking agent on board. Outpatient stress test performed on 02/01/2017 was low risk. He was admitted for flank pain on 02/04/2017 and found to have cholecystitis. He was discharged after laparoscopic cholecystectomy on 02/11/2017. He was last seen by Jeremy Ferries PA-C on 02/20/2017, he was still recovering from cholecystectomy at the time.  He presents today for cardiology office visit. He denies any significant chest discomfort or shortness of breath. He has been recovering well and denies any abdominal discomfort either. His blood pressure is low today 92/65. And has always been low for the past several month period, but despite this, he is asymptomatic. He is not on any blood pressure medication. He denies any dizziness, blurred vision or feeling of passing out. And he is aware that if he does have above symptoms, he will need to seek medical attention immediately. No further workup is expected at this time.   Past Medical History:  Diagnosis Date  . Anemia   . Anxiety   . Cataract   . Depression   . Diabetes mellitus without complication ([place])    type 2  . Hyperlipidemia   . PONV (postoperative nausea and vomiting)     Past Surgical History:  Procedure Laterality Date  . CATARACT EXTRACTION     bilateral  . CHOLECYSTECTOMY N/A 02/10/2017   Procedure: LAPAROSCOPIC CHOLECYSTECTOMY WITH INTRAOPERATIVE CHOLANGIOGRAM;  Surgeon: Jeremy Overall, MD;  Location: [facility];  Service: General;   Laterality: N/A;    Current Medications: Outpatient Medications Prior to Visit  Medication Sig Dispense Refill  . ALPRAZolam (XANAX) 1 MG tablet Take 1 mg by mouth 4 (four) times daily as needed for anxiety.    . fluvoxaMINE (LUVOX) 100 MG tablet Take 200 mg by mouth at bedtime.     . metFORMIN (GLUCOPHAGE) 500 MG tablet Take 500 mg by mouth 2 (two) times daily.     . QUEtiapine (SEROQUEL) 100 MG tablet Take 300 mg by mouth at bedtime.     . simvastatin (ZOCOR) 20 MG tablet Take 20 mg by mouth at bedtime.     . ondansetron (ZOFRAN ODT) 4 MG disintegrating tablet Take 1 tablet (4 mg total) by mouth every 8 (eight) hours as needed for nausea or vomiting. (Patient not taking: Reported on 07/10/2017) 20 tablet 0   No facility-administered medications prior to visit.      Allergies:   Patient has no known allergies.   Social History   Social History  . Marital status: Legally Separated    Spouse name: N/A  . Number of children: N/A  . Years of education: N/A   Social History Main Topics  . Smoking status: Current Every Day Smoker    Packs/day: 1.00    Years: 43.00    Types: Cigarettes  . Smokeless tobacco: Never Used  . Alcohol use No  . Drug use: No  . Sexual activity: No  Other Topics Concern  . None   Social History Narrative  . None     Family History:  The patient's family history is not on file.   ROS:   Please see the history of present illness.    ROS All other systems reviewed and are negative.   PHYSICAL EXAM:   VS:  BP 92/65 (BP Location: Right Arm, Cuff Size: Normal)   Pulse 60   Ht 5\' 8"  (1.727 m)   Wt 205 lb (93 kg)   BMI 31.17 kg/m    GEN: Well nourished, well developed, in no acute distress  HEENT: normal  Neck: no JVD, carotid bruits, or masses Cardiac: RRR; no murmurs, rubs, or gallops,no edema  Respiratory:  clear to auscultation bilaterally, normal work of breathing GI: soft, nontender, nondistended, + BS MS: no deformity or atrophy    Skin: warm and dry, no rash Neuro:  Alert and Oriented x 3, Strength and sensation are intact Psych: euthymic mood, full affect  Wt Readings from Last 3 Encounters:  07/10/17 205 lb (93 kg)  02/20/17 212 lb 12.8 oz (96.5 kg)  02/10/17 215 lb (97.5 kg)      Studies/Labs Reviewed:   EKG:  EKG is not ordered today.    Recent Labs: 01/23/2017: B Natriuretic Peptide 29.3 02/08/2017: ALT 25; BUN 21; Creatinine, Ser 1.10; Hemoglobin 12.5; Platelets 292; Potassium 4.4; Sodium 139   Lipid Panel No results found for: CHOL, TRIG, HDL, CHOLHDL, VLDL, LDLCALC, LDLDIRECT  Additional studies/ records that were reviewed today include:   Myoview 02/01/2017 Study Highlights  The left ventricular ejection fraction is mildly decreased (45-54%).  Nuclear stress EF: 53%.  There was no ST segment deviation noted during stress.  No T wave inversion was noted during stress.  The study is normal.  This is a low risk study.      ASSESSMENT:    1. Hypotension, unspecified hypotension type   2. Controlled type 2 diabetes mellitus without complication, without long-term current use of insulin ([place])   3. Hyperlipidemia, unspecified hyperlipidemia type   4. Tobacco abuse      PLAN:  In order of problems listed above:  1. Hypotension: Blood pressure has been chronically low. He is asymptomatic from this. He is aware that he will need to contact us if he does have dizziness, blurred vision or feeling of passing out.  2. Hyperlipidemia: Continue Zocor 20 mg daily. We will defer annual lab work to primary care provider.  3. DM 2: On metformin, will defer management to primary care provider.    Medication Adjustments/Labs and Tests Ordered: Current medicines are reviewed at length with the patient today.  Concerns regarding medicines are outlined above.  Medication changes, Labs and Tests ordered today are listed in the Patient Instructions below. Patient Instructions  Medication Instructions:   Continue current medications  If you need a refill on your cardiac medications before your next appointment, please call your pharmacy.  Labwork: None Ordered   Testing/Procedures: None Ordered  Follow-Up: Your physician wants you to follow-up in: 1 Year. You should receive a reminder letter in the mail two months in advance. If you do not receive a letter, please call our office [phone].   Thank you for choosing [facility], [place], [place]  07/12/2017 6:47 AM    [address], [place], [place]  [address] Phone: [phone]; Fax: [phone]

## 2017-07-10 NOTE — Patient Instructions (Signed)
Medication Instructions:  Continue current medications  If you need a refill on your cardiac medications before your next appointment, please call your pharmacy.  Labwork: None Ordered   Testing/Procedures: None Ordered  Follow-Up: Your physician wants you to follow-up in: 1 Year. You should receive a reminder letter in the mail two months in advance. If you do not receive a letter, please call our office [phone].    Thank you for choosing [facility]!!      

## 2017-07-12 ENCOUNTER — Encounter: Payer: Self-pay | Admitting: Physician Assistant

## 2018-02-01 LAB — HM COLONOSCOPY

## 2018-07-12 ENCOUNTER — Ambulatory Visit: Payer: Medicare Other | Admitting: Neurology

## 2018-07-12 ENCOUNTER — Encounter

## 2019-11-07 ENCOUNTER — Telehealth: Payer: Self-pay | Admitting: *Deleted

## 2019-11-07 NOTE — Telephone Encounter (Signed)
Called to schedule 1st Covid 19 vaccine. Scheduled for 2/26@4 :45pm at [facility].

## 2019-11-08 ENCOUNTER — Ambulatory Visit: Payer: Medicare Other | Attending: Internal Medicine

## 2019-11-08 DIAGNOSIS — Z23 Encounter for immunization: Secondary | ICD-10-CM | POA: Insufficient documentation

## 2019-11-08 NOTE — Progress Notes (Signed)
   Covid-19 Vaccination Clinic  Name:  Jeremy Shannon    MRN: [id number] DOB: [birth year]  11/08/2019  Jeremy Shannon was observed post Covid-19 immunization for 15 minutes and upon checkout starting reporting dizziness. At [place], Hassan Rowan RN and Pattie RN, placed patient to chair. Patient was steady on feet to chair. BP was 143/68, P100 with O2 sat of 96%. Patient voicies no other complaints. Provided patient with water and EMS at side for evaluation and monitored patient. After 5 minutes, patient states he was feeling much better and dizziness had subsided. BP 147/71  Pulse 94, and at 445p BP 134/93, HR 90. Released by EMS patient stayed for another 5 minutes and patient stated he was okay to leave. Encouraged patient to notify his provider of events that occurred.  Patient assisted to car  He was provided with Vaccine Information Sheet and instruction to access the V-Safe system.   Jeremy Shannon was instructed to call 911 with any severe reactions post vaccine: Marland Kitchen Difficulty breathing  . Swelling of your face and throat  . A fast heartbeat  . A bad rash all over your body  . Dizziness and weakness    Immunizations Administered    Name Date Dose VIS Date Route   Pfizer COVID-19 Vaccine 11/08/2019  4:16 PM 0.3 mL 08/23/2019 Intramuscular   Manufacturer: [place]   Lot: [id number]   [place]: [id number]

## 2019-12-04 ENCOUNTER — Ambulatory Visit: Payer: Medicare Other | Attending: Internal Medicine

## 2019-12-04 DIAGNOSIS — Z23 Encounter for immunization: Secondary | ICD-10-CM

## 2019-12-04 NOTE — Progress Notes (Signed)
   Covid-19 Vaccination Clinic  Name:  Jeremy Shannon    MRN: [id number] DOB: [birth year]  12/04/2019  Jeremy Shannon was observed post Covid-19 immunization for 15 minutes without incident. He was provided with Vaccine Information Sheet and instruction to access the V-Safe system.   Jeremy Shannon was instructed to call 911 with any severe reactions post vaccine: Marland Kitchen Difficulty breathing  . Swelling of face and throat  . A fast heartbeat  . A bad rash all over body  . Dizziness and weakness   Immunizations Administered    Name Date Dose VIS Date Route   Pfizer COVID-19 Vaccine 12/04/2019  3:22 PM 0.3 mL 08/23/2019 Intramuscular   Manufacturer: [place]   Lot: [id number]   [place]: [id number]

## 2019-12-19 ENCOUNTER — Encounter (HOSPITAL_COMMUNITY): Payer: Self-pay | Admitting: Emergency Medicine

## 2019-12-19 ENCOUNTER — Emergency Department (HOSPITAL_COMMUNITY)
Admission: EM | Admit: 2019-12-19 | Discharge: 2019-12-19 | Disposition: A | Discharge status: Home / Self Care | Payer: Medicare Other | Attending: Emergency Medicine | Admitting: Emergency Medicine

## 2019-12-19 ENCOUNTER — Other Ambulatory Visit: Payer: Self-pay

## 2019-12-19 DIAGNOSIS — H9313 Tinnitus, bilateral: Secondary | ICD-10-CM | POA: Insufficient documentation

## 2019-12-19 DIAGNOSIS — F17210 Nicotine dependence, cigarettes, uncomplicated: Secondary | ICD-10-CM | POA: Insufficient documentation

## 2019-12-19 DIAGNOSIS — F419 Anxiety disorder, unspecified: Secondary | ICD-10-CM

## 2019-12-19 DIAGNOSIS — Z9114 Patient's other noncompliance with medication regimen: Secondary | ICD-10-CM

## 2019-12-19 MED ORDER — HYDROXYZINE HCL 25 MG PO TABS: 25.0000 mg | ORAL_TABLET | Freq: Three times a day (TID) | ORAL | 0 refills | Status: DC | PRN

## 2019-12-19 NOTE — Discharge Instructions (Signed)
Take the medicine as needed to help with the extra anxiety.  Follow-up with your Psychiatrist.  Take the medications as prescribed.

## 2019-12-19 NOTE — ED Provider Notes (Signed)
[name] Provider Note   CSN: [id number] Arrival date & time: 12/19/19  0825     History Chief Complaint  Patient presents with  . Medication Refill  . Anxiety    Jeremy Shannon is a 64 y.o. male.  HPI Patient states he has run out of his Luvox a week early.  States he is supposed be taking 3 night is to be taking for night.  States he has been more anxious.  States that he is more anxious now and has some ringing in his ears.  States he called his psychiatrist and that she would not give him a refill until he is due a week from now.  Denies suicidal homicidal thoughts.  Denies hallucinations.  Does have Xanax that he also takes for anxiety.  States he does have this at home.    Past Medical History:  Diagnosis Date  . Anemia   . Anxiety   . Cataract   . Depression   . Diabetes mellitus without complication ([place])    type 2  . Hyperlipidemia   . PONV (postoperative nausea and vomiting)     Patient Active Problem List   Diagnosis Date Noted  . Acute cholecystitis with chronic cholecystitis s/p lap cholecystectomy 02/10/2017 02/10/2017  . Chest pain, rule out acute myocardial infarction 01/23/2017  . Bradycardia with 41-50 beats per minute 01/23/2017  . Polysubstance dependence ([place]) 12/13/2012  . ANXIETY 12/17/2007  . TOBACCO ABUSE 12/17/2007  . DEPRESSION 12/17/2007  . COLONIC POLYPS 05/07/2007  . DIVERTICULOSIS, COLON 05/07/2007    Past Surgical History:  Procedure Laterality Date  . CATARACT EXTRACTION     bilateral  . CHOLECYSTECTOMY N/A 02/10/2017   Procedure: LAPAROSCOPIC CHOLECYSTECTOMY WITH INTRAOPERATIVE CHOLANGIOGRAM;  Surgeon: Alphonsa Overall, MD;  Location: [facility];  Service: General;  Laterality: N/A;       No family history on file.  Social History   Tobacco Use  . Smoking status: Current Every Day Smoker    Packs/day: 1.00    Years: 43.00    Pack years: 43.00    Types: Cigarettes  . Smokeless tobacco:  Never Used  Substance Use Topics  . Alcohol use: No  . Drug use: No    Home Medications Prior to Admission medications   Medication Sig Start Date End Date Taking? Authorizing Provider  ALPRAZolam Duanne Moron) 1 MG tablet Take 1 mg by mouth 4 (four) times daily as needed for anxiety. 01/19/17   [provider]  fluvoxaMINE (LUVOX) 100 MG tablet Take 200 mg by mouth at bedtime.     [provider]  hydrOXYzine (ATARAX/VISTARIL) 25 MG tablet Take 1 tablet (25 mg total) by mouth every 8 (eight) hours as needed for anxiety. 12/19/19   Davonna Belling, MD  metFORMIN (GLUCOPHAGE) 500 MG tablet Take 500 mg by mouth 2 (two) times daily.     [provider]  QUEtiapine (SEROQUEL) 100 MG tablet Take 300 mg by mouth at bedtime.     [provider]  simvastatin (ZOCOR) 20 MG tablet Take 20 mg by mouth at bedtime.     [provider]    Allergies    Patient has no known allergies.  Review of Systems   Review of Systems  Constitutional: Positive for appetite change.  HENT: Positive for tinnitus.   Respiratory: Negative for shortness of breath.   Cardiovascular: Negative for chest pain.  Gastrointestinal: Negative for abdominal pain.  Genitourinary: Negative for flank pain.  Musculoskeletal: Negative  for back pain.  Skin: Negative for rash.  Neurological: Negative for weakness.  Psychiatric/Behavioral: Negative for dysphoric mood and suicidal ideas. The patient is nervous/anxious.     Physical Exam Updated Vital Signs BP 131/85   Pulse 84   Temp 98 F (36.7 C) (Oral)   Resp 17   SpO2 94%   Physical Exam Vitals and nursing note reviewed.  HENT:     Head: Atraumatic.  Eyes:     Extraocular Movements: Extraocular movements intact.  Cardiovascular:     Rate and Rhythm: Regular rhythm.  Pulmonary:     Breath sounds: No wheezing, rhonchi or rales.  Abdominal:     Tenderness: There is no abdominal tenderness.  Musculoskeletal:     Right lower  leg: No edema.     Left lower leg: No edema.  Skin:    General: Skin is warm.  Neurological:     Mental Status: He is alert.     Comments: Patient is awake and appropriate.  Psychiatric:        Mood and Affect: Mood normal.     ED Results / Procedures / Treatments   Labs (all labs ordered are listed, but only abnormal results are displayed) Labs Reviewed - No data to display  EKG None  Radiology No results found.  Procedures Procedures (including critical care time)  Medications Ordered in ED Medications - No data to display  ED Course  I have reviewed the triage vital signs and the nursing notes.  Pertinent labs & imaging results that were available during my care of the patient were reviewed by me and considered in my medical decision making (see chart for details).    MDM Rules/Calculators/A&P                      Patient presents after overuse of his Luvox.  Now I would a week early.  Finished last night so unlikely withdrawal at this time but does have underlying anxiety.  Patient psychiatrist reportedly said that she would not refill the medicine and I will not go against this.  Will give some Vistaril to help with the anxiety and he can follow-up with her. Final Clinical Impression(s) / ED Diagnoses Final diagnoses:  Overuse of medication  Anxiety    Rx / DC Orders ED Discharge Orders         Ordered    hydrOXYzine (ATARAX/VISTARIL) 25 MG tablet  Every 8 hours PRN     12/19/19 0901           Davonna Belling, MD 12/19/19 0901

## 2019-12-19 NOTE — ED Triage Notes (Addendum)
Pt reports was taking too much of his medication and ran out and can't get filled until next week. Reports that having bad anxiety and withdrawals even though ran out last night. Pt adds has ringing in his ears.

## 2019-12-21 ENCOUNTER — Other Ambulatory Visit: Payer: Self-pay

## 2019-12-21 ENCOUNTER — Emergency Department (HOSPITAL_COMMUNITY)
Admission: EM | Admit: 2019-12-21 | Discharge: 2019-12-21 | Disposition: A | Discharge status: Home / Self Care | Payer: Medicare Other | Attending: Emergency Medicine | Admitting: Emergency Medicine

## 2019-12-21 ENCOUNTER — Encounter (HOSPITAL_COMMUNITY): Payer: Self-pay | Admitting: *Deleted

## 2019-12-21 DIAGNOSIS — Z760 Encounter for issue of repeat prescription: Secondary | ICD-10-CM | POA: Diagnosis not present

## 2019-12-21 NOTE — ED Triage Notes (Signed)
Pt wants refill for fluvoxamine to last until 4/14 when he can get his next refill. He was seen recently for same, given vistaril and says it didn't help.

## 2019-12-21 NOTE — ED Notes (Signed)
Pt frustrated about wait time, asking what is taking so long. Pt given ginger ale. Case discussed with provider. Pt advised of plan and he sts he does not want EDP speaking to his psychiatrist and "will tough it out until Tuesday". Provider made aware. PA at bedside to speak with pt who sts he feels safe to leave and pick up his prescriptions on Tuesday.

## 2019-12-21 NOTE — Discharge Instructions (Signed)
Please follow up with your psychiatrist.  Please return to the emergency department for any new or worsening symptoms.

## 2019-12-21 NOTE — ED Provider Notes (Signed)
[facility] DEPT Provider Note   CSN: [id number] Arrival date & time: 12/21/19  1047     History Chief Complaint  Patient presents with  . Medication Refill    Jeremy Shannon is a 64 y.o. male.  HPI   64 year old male with a history of anemia, anxiety, cataracts, depression, diabetes, hyperlipidemia, who presents the emergency department today for a medication refill.  States that he ran out of his fluvoxamine.  He is typically supposed to take 3 tablets at night.  He was seen in the ED last week for a similar complaint and had reported to the provider at that time that he had been taking 4 tablets at night rather than 3.  The chart indicates that the patient psychiatrist did not want to refill his prescription and this provider gave Rx for hydroxyzine rather than SSRI due to this reason.  The patient is back today requesting a refill of his SSRI.  States he needs a few tablets before his Rx can be refilled on Tuesday, 12/24/2019.  Reports feeling increased anxiety.  He denies suicidal or homicidal ideations.  He states he did not contact his psychiatrist about this.  He has Xanax at home but does not like taking it and states that it does not work.  Past Medical History:  Diagnosis Date  . Anemia   . Anxiety   . Cataract   . Depression   . Diabetes mellitus without complication ([place])    type 2  . Hyperlipidemia   . PONV (postoperative nausea and vomiting)     Patient Active Problem List   Diagnosis Date Noted  . Acute cholecystitis with chronic cholecystitis s/p lap cholecystectomy 02/10/2017 02/10/2017  . Chest pain, rule out acute myocardial infarction 01/23/2017  . Bradycardia with 41-50 beats per minute 01/23/2017  . Polysubstance dependence ([place]) 12/13/2012  . ANXIETY 12/17/2007  . TOBACCO ABUSE 12/17/2007  . DEPRESSION 12/17/2007  . COLONIC POLYPS 05/07/2007  . DIVERTICULOSIS, COLON 05/07/2007    Past Surgical History:  Procedure  Laterality Date  . CATARACT EXTRACTION     bilateral  . CHOLECYSTECTOMY N/A 02/10/2017   Procedure: LAPAROSCOPIC CHOLECYSTECTOMY WITH INTRAOPERATIVE CHOLANGIOGRAM;  Surgeon: Alphonsa Overall, MD;  Location: [facility];  Service: General;  Laterality: N/A;       No family history on file.  Social History   Tobacco Use  . Smoking status: Current Every Day Smoker    Packs/day: 1.00    Years: 43.00    Pack years: 43.00    Types: Cigarettes  . Smokeless tobacco: Never Used  Substance Use Topics  . Alcohol use: No  . Drug use: No    Home Medications Prior to Admission medications   Medication Sig Start Date End Date Taking? Authorizing Provider  ALPRAZolam Duanne Moron) 1 MG tablet Take 1 mg by mouth 4 (four) times daily as needed for anxiety. 01/19/17   [provider]  fluvoxaMINE (LUVOX) 100 MG tablet Take 200 mg by mouth at bedtime.     [provider]  hydrOXYzine (ATARAX/VISTARIL) 25 MG tablet Take 1 tablet (25 mg total) by mouth every 8 (eight) hours as needed for anxiety. 12/19/19   Davonna Belling, MD  metFORMIN (GLUCOPHAGE) 500 MG tablet Take 500 mg by mouth 2 (two) times daily.     [provider]  QUEtiapine (SEROQUEL) 100 MG tablet Take 300 mg by mouth at bedtime.     [provider]  simvastatin (ZOCOR) 20 MG tablet Take 20  mg by mouth at bedtime.     [provider]    Allergies    Patient has no known allergies.  Review of Systems   Review of Systems  Constitutional: Negative for fever.  Respiratory: Negative for shortness of breath.   Cardiovascular: Negative for chest pain.  Gastrointestinal: Negative for abdominal pain.  Neurological: Negative for headaches.  Psychiatric/Behavioral: Negative for suicidal ideas. The patient is nervous/anxious.     Physical Exam Updated Vital Signs BP 121/89   Pulse 82   Temp 98.1 F (36.7 C) (Oral)   Resp 18   Ht 5\' 9"  (1.753 m)   Wt 96.2 kg   SpO2 95%   BMI 31.31 kg/m   Physical  Exam Constitutional:      General: He is not in acute distress.    Appearance: He is well-developed.  Eyes:     Conjunctiva/sclera: Conjunctivae normal.  Cardiovascular:     Rate and Rhythm: Normal rate.  Pulmonary:     Effort: Pulmonary effort is normal.  Skin:    General: Skin is warm and dry.  Neurological:     Mental Status: He is alert and oriented to person, place, and time.     Comments: Clear speech, ambulatory with steady gait  Psychiatric:     Comments: Appears anxious, denies SI/HI     ED Results / Procedures / Treatments   Labs (all labs ordered are listed, but only abnormal results are displayed) Labs Reviewed - No data to display  EKG None  Radiology No results found.  Procedures Procedures (including critical care time)  Medications Ordered in ED Medications - No data to display  ED Course  I have reviewed the triage vital signs and the nursing notes.  Pertinent labs & imaging results that were available during my care of the patient were reviewed by me and considered in my medical decision making (see chart for details).    MDM Rules/Calculators/A&P                      Patient presenting for medication refill.  He had been taking extra tablets of his SSRI so he ran out a week early.  His prescription is due to be filled in the next 3 days.  He is here requesting tablets to hold him over until then.  I explained to the patient that I will need to clarify with his psychiatrist that they are okay with me refilling the prescription during this time given that he had been taking them inappropriately.  He stated that he did not want to wait for me to contact his psychiatrist and that he would "tough it out "and wait till he could fill his prescription on Tuesday.  He denies suicidality or homicidality.  He does not appear to have any emergent psychiatric illness at this time that would require psychiatric consultation.  He does not appear to have any emergent  medical complaint at this time that would require further work-up or admission to the hospital.  I feel that he is appropriate for discharge with close follow-up with a psychiatrist.  Advised on return precautions.  Patient discharged in stable condition.   Final Clinical Impression(s) / ED Diagnoses Final diagnoses:  Medication refill    Rx / DC Orders ED Discharge Orders    None       Bishop Dublin 12/21/19 1225    Milton Ferguson, MD 12/21/19 1352

## 2020-09-03 ENCOUNTER — Ambulatory Visit (HOSPITAL_COMMUNITY)
Admission: EM | Admit: 2020-09-03 | Discharge: 2020-09-03 | Disposition: A | Discharge status: Home / Self Care | Payer: Medicare Other | Attending: Emergency Medicine | Admitting: Emergency Medicine

## 2020-09-03 ENCOUNTER — Encounter (HOSPITAL_COMMUNITY): Payer: Self-pay

## 2020-09-03 ENCOUNTER — Other Ambulatory Visit: Payer: Self-pay

## 2020-09-03 DIAGNOSIS — Z760 Encounter for issue of repeat prescription: Secondary | ICD-10-CM | POA: Diagnosis not present

## 2020-09-03 MED ORDER — SUMATRIPTAN SUCCINATE 50 MG PO TABS: 50.0000 mg | ORAL_TABLET | Freq: Once | ORAL | 0 refills | Status: DC

## 2020-09-03 NOTE — ED Provider Notes (Signed)
[facility]  ____________________________________________  Time seen: Approximately 5:36 PM  I have reviewed the triage vital signs and the nursing notes.   HISTORY  Chief Complaint Headache   Historian Patient     HPI Jeremy Shannon is a 64 y.o. male with a history of cluster headaches, presents to the urgent care with a right-sided headache.  Patient states that he was diagnosed with cluster headaches several years ago.  He states that he only experiences cluster headaches every other year and is prescribed Imitrex.  Patient states that he takes 50 mg of Imitrex by mouth and his symptoms resolved.  Patient denies changes in vision or weakness of the upper and lower extremities.  He states that this headache is very typical for him.  He denies falls or mechanisms of trauma.  No fever.   Past Medical History:  Diagnosis Date   Anemia    Anxiety    Cataract    Depression    Diabetes mellitus without complication (HCC)    type 2   Hyperlipidemia    PONV (postoperative nausea and vomiting)      Immunizations up to date:  Yes.     Past Medical History:  Diagnosis Date   Anemia    Anxiety    Cataract    Depression    Diabetes mellitus without complication ([place])    type 2   Hyperlipidemia    PONV (postoperative nausea and vomiting)     Patient Active Problem List   Diagnosis Date Noted   Acute cholecystitis with chronic cholecystitis s/p lap cholecystectomy 02/10/2017 02/10/2017   Chest pain, rule out acute myocardial infarction 01/23/2017   Bradycardia with 41-50 beats per minute 01/23/2017   Polysubstance dependence ([place]) 12/13/2012   ANXIETY 12/17/2007   TOBACCO ABUSE 12/17/2007   DEPRESSION 12/17/2007   COLONIC POLYPS 05/07/2007   DIVERTICULOSIS, COLON 05/07/2007    Past Surgical History:  Procedure Laterality Date   CATARACT EXTRACTION     bilateral   CHOLECYSTECTOMY N/A 02/10/2017   Procedure: LAPAROSCOPIC  CHOLECYSTECTOMY WITH INTRAOPERATIVE CHOLANGIOGRAM;  Surgeon: Alphonsa Overall, MD;  Location: [facility];  Service: General;  Laterality: N/A;    Prior to Admission medications   Medication Sig Start Date End Date Taking? Authorizing Provider  ALPRAZolam Duanne Moron) 1 MG tablet Take 1 mg by mouth 4 (four) times daily as needed for anxiety. 01/19/17   [provider]  fluvoxaMINE (LUVOX) 100 MG tablet Take 200 mg by mouth at bedtime.     [provider]  hydrOXYzine (ATARAX/VISTARIL) 25 MG tablet Take 1 tablet (25 mg total) by mouth every 8 (eight) hours as needed for anxiety. 12/19/19   Davonna Belling, MD  metFORMIN (GLUCOPHAGE) 500 MG tablet Take 500 mg by mouth 2 (two) times daily.     [provider]  QUEtiapine (SEROQUEL) 100 MG tablet Take 300 mg by mouth at bedtime.     [provider]  simvastatin (ZOCOR) 20 MG tablet Take 20 mg by mouth at bedtime.     [provider]  SUMAtriptan (IMITREX) 50 MG tablet Take 1 tablet (50 mg total) by mouth once for 1 dose. May repeat in 2 hours if headache persists or recurs. 09/03/20 09/03/20  Lannie Fields, PA-C    Allergies Patient has no known allergies.  History reviewed. No pertinent family history.  Social History Social History   Tobacco Use   Smoking status: Current Every Day Smoker    Packs/day: 1.00  Years: 43.00    Pack years: 43.00    Types: Cigarettes   Smokeless tobacco: Never Used  Substance Use Topics   Alcohol use: No   Drug use: No     Review of Systems  Constitutional: No fever/chills Eyes:  No discharge ENT: No upper respiratory complaints. Respiratory: no cough. No SOB/ use of accessory muscles to breath Gastrointestinal:   No nausea, no vomiting.  No diarrhea.  No constipation. Musculoskeletal: Negative for musculoskeletal pain. Neuro: Patient has headache.  Skin: Negative for rash, abrasions, lacerations,  ecchymosis.    ____________________________________________   PHYSICAL EXAM:  VITAL SIGNS: ED Triage Vitals  Enc Vitals Group     BP 09/03/20 1656 127/85     Pulse Rate 09/03/20 1656 68     Resp 09/03/20 1656 17     Temp 09/03/20 1656 97.6 F (36.4 C)     Temp Source 09/03/20 1656 Oral     SpO2 09/03/20 1656 96 %     Weight --      Height --      Head Circumference --      Peak Flow --      Pain Score 09/03/20 1655 7     Pain Loc --      Pain Edu? --      Excl. in [place]? --      Constitutional: Alert and oriented. Well appearing and in no acute distress. Eyes: Conjunctivae are normal. PERRL. EOMI. Head: Atraumatic. ENT:      Ears: TMs are pearly.       Nose: No congestion/rhinnorhea.      Mouth/Throat: Mucous membranes are moist.  Neck: No stridor. FROM.  Cardiovascular: Normal rate, regular rhythm. Normal S1 and S2.  Good peripheral circulation. Respiratory: Normal respiratory effort without tachypnea or retractions. Lungs CTAB. Good air entry to the bases with no decreased or absent breath sounds Gastrointestinal: Bowel sounds x 4 quadrants. Soft and nontender to palpation. No guarding or rigidity. No distention. Musculoskeletal: Full range of motion to all extremities. No obvious deformities noted Neurologic:  Normal for age. No gross focal neurologic deficits are appreciated.  Negative Romberg.  Patient can perform heel-to-toe. Cranial nerves 2-12 are intact.  Skin:  Skin is warm, dry and intact. No rash noted. Psychiatric: Mood and affect are normal for age. Speech and behavior are normal.   ____________________________________________   LABS (all labs ordered are listed, but only abnormal results are displayed)  Labs Reviewed - No data to display ____________________________________________  EKG   ____________________________________________  RADIOLOGY   No results found.  ____________________________________________    PROCEDURES  Procedure(s)  performed:     Procedures     Medications - No data to display   ____________________________________________   INITIAL IMPRESSION / ASSESSMENT AND PLAN / ED COURSE  Pertinent labs & imaging results that were available during my care of the patient were reviewed by me and considered in my medical decision making (see chart for details).     Assessment and plan Cluster headache 64 year old male with a history of cluster headaches, presents to the urgent care with a classic cluster headache for him.  Vital signs were reassuring at triage.  Patient no neuro deficits noted on exam.  I prescribed patient 10 tablets of Imitrex and gave instructions for use.  Instructed patient to take 50 mg of Imitrex and to repeat dosage in 2 hours if symptoms not improved.  I cautioned patient that he should seek care at a local  emergency department if he experiences worsening headache or weakness of the upper and lower extremities.  He voiced understanding and has easy access to the emergency department should symptoms change or worsen.      ____________________________________________  FINAL CLINICAL IMPRESSION(S) / ED DIAGNOSES  Final diagnoses:  Medication refill      NEW MEDICATIONS STARTED DURING THIS VISIT:  ED Discharge Orders         Ordered    SUMAtriptan (IMITREX) 50 MG tablet   Once        09/03/20 1734              This chart was dictated using voice recognition software/Dragon. Despite best efforts to proofread, errors can occur which can change the meaning. Any change was purely unintentional.     Lannie Fields, PA-C 09/03/20 1741

## 2020-09-03 NOTE — Discharge Instructions (Signed)
Take 50 mg Imitrex.  Repeat in 2 hours if symptoms persist.. If your headache worsens at home, please seek care in emergency department.

## 2020-09-03 NOTE — ED Triage Notes (Signed)
Pt presents with recurrent headache; mostly on right side x 3 days.

## 2020-11-09 LAB — HM DIABETES EYE EXAM

## 2020-11-11 ENCOUNTER — Encounter: Payer: Self-pay | Admitting: Internal Medicine

## 2020-11-11 ENCOUNTER — Ambulatory Visit (INDEPENDENT_AMBULATORY_CARE_PROVIDER_SITE_OTHER): Payer: Medicare Other | Admitting: Internal Medicine

## 2020-11-11 ENCOUNTER — Other Ambulatory Visit: Payer: Self-pay

## 2020-11-11 ENCOUNTER — Encounter (INDEPENDENT_AMBULATORY_CARE_PROVIDER_SITE_OTHER): Payer: Self-pay

## 2020-11-11 ENCOUNTER — Ambulatory Visit (INDEPENDENT_AMBULATORY_CARE_PROVIDER_SITE_OTHER): Payer: Medicare Other

## 2020-11-11 VITALS — BP 122/84 | HR 78 | Temp 98.2°F | Resp 16 | Ht 69.0 in | Wt 241.0 lb

## 2020-11-11 DIAGNOSIS — E785 Hyperlipidemia, unspecified: Secondary | ICD-10-CM | POA: Diagnosis not present

## 2020-11-11 DIAGNOSIS — R059 Cough, unspecified: Secondary | ICD-10-CM

## 2020-11-11 DIAGNOSIS — R0609 Other forms of dyspnea: Secondary | ICD-10-CM

## 2020-11-11 DIAGNOSIS — E781 Pure hyperglyceridemia: Secondary | ICD-10-CM

## 2020-11-11 DIAGNOSIS — J410 Simple chronic bronchitis: Secondary | ICD-10-CM

## 2020-11-11 DIAGNOSIS — R0600 Dyspnea, unspecified: Secondary | ICD-10-CM

## 2020-11-11 DIAGNOSIS — N401 Enlarged prostate with lower urinary tract symptoms: Secondary | ICD-10-CM | POA: Diagnosis not present

## 2020-11-11 DIAGNOSIS — G629 Polyneuropathy, unspecified: Secondary | ICD-10-CM | POA: Diagnosis not present

## 2020-11-11 DIAGNOSIS — E118 Type 2 diabetes mellitus with unspecified complications: Secondary | ICD-10-CM

## 2020-11-11 DIAGNOSIS — Z23 Encounter for immunization: Secondary | ICD-10-CM

## 2020-11-11 DIAGNOSIS — Z0001 Encounter for general adult medical examination with abnormal findings: Secondary | ICD-10-CM | POA: Diagnosis not present

## 2020-11-11 DIAGNOSIS — R001 Bradycardia, unspecified: Secondary | ICD-10-CM

## 2020-11-11 DIAGNOSIS — D539 Nutritional anemia, unspecified: Secondary | ICD-10-CM | POA: Diagnosis not present

## 2020-11-11 DIAGNOSIS — D126 Benign neoplasm of colon, unspecified: Secondary | ICD-10-CM

## 2020-11-11 DIAGNOSIS — R0683 Snoring: Secondary | ICD-10-CM

## 2020-11-11 LAB — HEPATIC FUNCTION PANEL
ALT: 30 U/L (ref 0–53)
AST: 16 U/L (ref 0–37)
Albumin: 4.1 g/dL (ref 3.5–5.2)
Alkaline Phosphatase: 77 U/L (ref 39–117)
Bilirubin, Direct: 0.1 mg/dL (ref 0.0–0.3)
Total Bilirubin: 0.3 mg/dL (ref 0.2–1.2)
Total Protein: 6.6 g/dL (ref 6.0–8.3)

## 2020-11-11 LAB — MICROALBUMIN / CREATININE URINE RATIO
Creatinine,U: 211 mg/dL
Microalb Creat Ratio: 0.4 mg/g (ref 0.0–30.0)
Microalb, Ur: 0.9 mg/dL (ref 0.0–1.9)

## 2020-11-11 LAB — POCT GLYCOSYLATED HEMOGLOBIN (HGB A1C): Hemoglobin A1C: 7.2 % — AB (ref 4.0–5.6)

## 2020-11-11 LAB — BASIC METABOLIC PANEL
BUN: 17 mg/dL (ref 6–23)
CO2: 29 mEq/L (ref 19–32)
Calcium: 9.1 mg/dL (ref 8.4–10.5)
Chloride: 104 mEq/L (ref 96–112)
Creatinine, Ser: 1.12 mg/dL (ref 0.40–1.50)
GFR: 68.28 mL/min (ref >60.00)
Glucose, Bld: 97 mg/dL (ref 70–99)
Potassium: 4.2 mEq/L (ref 3.5–5.1)
Sodium: 139 mEq/L (ref 135–145)

## 2020-11-11 LAB — LIPID PANEL
Cholesterol: 137 mg/dL (ref 0–200)
HDL: 38.8 mg/dL — ABNORMAL LOW (ref >39.00)
Total CHOL/HDL Ratio: 4
Triglycerides: 471 mg/dL — ABNORMAL HIGH (ref 0.0–149.0)

## 2020-11-11 LAB — CBC WITH DIFFERENTIAL/PLATELET
Basophils Absolute: 0.1 10*3/uL (ref 0.0–0.1)
Basophils Relative: 0.7 % (ref 0.0–3.0)
Eosinophils Absolute: 0.1 10*3/uL (ref 0.0–0.7)
Eosinophils Relative: 1.5 % (ref 0.0–5.0)
HCT: 43.3 % (ref 39.0–52.0)
Hemoglobin: 15 g/dL (ref 13.0–17.0)
Lymphocytes Relative: 27.1 % (ref 12.0–46.0)
Lymphs Abs: 2.4 10*3/uL (ref 0.7–4.0)
MCHC: 34.6 g/dL (ref 30.0–36.0)
MCV: 92.4 fl (ref 78.0–100.0)
Monocytes Absolute: 0.6 10*3/uL (ref 0.1–1.0)
Monocytes Relative: 6.3 % (ref 3.0–12.0)
Neutro Abs: 5.7 10*3/uL (ref 1.4–7.7)
Neutrophils Relative %: 64.4 % (ref 43.0–77.0)
Platelets: 253 10*3/uL (ref 150.0–400.0)
RBC: 4.69 Mil/uL (ref 4.22–5.81)
RDW: 13.4 % (ref 11.5–15.5)
WBC: 8.9 10*3/uL (ref 4.0–10.5)

## 2020-11-11 LAB — LDL CHOLESTEROL, DIRECT: Direct LDL: 52 mg/dL

## 2020-11-11 LAB — POCT GLUCOSE (DEVICE FOR HOME USE): POC Glucose: 123 mg/dl — AB (ref 70–99)

## 2020-11-11 LAB — IRON: Iron: 83 ug/dL (ref 42–165)

## 2020-11-11 NOTE — Progress Notes (Signed)
Subjective:  Patient ID: Jeremy Shannon, male    DOB: 10/13/1955  Age: 65 y.o. MRN: [id number]  CC: Annual Exam, Anemia, COPD, and Diabetes  This visit occurred during the SARS-CoV-2 public health emergency.  Safety protocols were in place, including screening questions prior to the visit, additional usage of staff PPE, and extensive cleaning of exam room while observing appropriate contact time as indicated for disinfecting solutions.    HPI Jeremy Shannon presents for a CPX and to establish.  He is with his wife today and she tells most of the history.  It sounds like he has had a gradual decline over the last year.  He complains of chronic low back pain, diffuse weakness, fatigue, shortness of breath, wheezing, frequent falls, and his wife thinks he drags his feet.  He also complains of weight gain and insomnia.  He is using an albuterol inhaler wheezing.  He also has a chronic nonproductive cough and an extensive history of tobacco abuse. He has a hx of anemia.  He complains of shortness of breath at rest and dyspnea on exertion.  He denies chest pain or diaphoresis.  History Josemanuel has a past medical history of Anemia, Anxiety, Cataract, Depression, Diabetes mellitus without complication ([place]), Hyperlipidemia, and PONV (postoperative nausea and vomiting).   He has a past surgical history that includes Cataract extraction and Cholecystectomy (N/A, 02/10/2017).   His family history includes Depression in his father; Drug abuse in his father; Liver cancer in his mother.He reports that he has been smoking cigarettes. He has a 43.00 pack-year smoking history. He has never used smokeless tobacco. He reports that he does not drink alcohol and does not use drugs.  Outpatient Medications Prior to Visit  Medication Sig Dispense Refill  . ALPRAZolam (XANAX) 1 MG tablet Take 1 mg by mouth 4 (four) times daily as needed for anxiety.    . fluvoxaMINE (LUVOX) 100 MG tablet Take 200 mg by mouth at  bedtime.     . hydrOXYzine (ATARAX/VISTARIL) 25 MG tablet Take 1 tablet (25 mg total) by mouth every 8 (eight) hours as needed for anxiety. 12 tablet 0  . QUEtiapine (SEROQUEL) 100 MG tablet Take 300 mg by mouth at bedtime.     . metFORMIN (GLUCOPHAGE) 500 MG tablet Take 500 mg by mouth 2 (two) times daily.     . simvastatin (ZOCOR) 20 MG tablet Take 20 mg by mouth at bedtime.     . SUMAtriptan (IMITREX) 50 MG tablet Take 1 tablet (50 mg total) by mouth once for 1 dose. May repeat in 2 hours if headache persists or recurs. 10 tablet 0   No facility-administered medications prior to visit.    ROS Review of Systems  Constitutional: Positive for fatigue and unexpected weight change. Negative for appetite change, chills, diaphoresis and fever.  HENT: Negative.   Eyes: Negative.  Negative for visual disturbance.  Respiratory: Positive for cough, shortness of breath and wheezing. Negative for apnea, choking, chest tightness and stridor.        Heavy snoring  Cardiovascular: Negative for chest pain, palpitations and leg swelling.  Gastrointestinal: Negative for abdominal pain, blood in stool, constipation, diarrhea, nausea and vomiting.  Endocrine: Positive for polydipsia. Negative for polyphagia and polyuria.  Genitourinary: Negative.  Negative for difficulty urinating, hematuria and urgency.  Musculoskeletal: Negative for arthralgias, back pain and myalgias.  Skin: Negative.  Negative for color change and rash.  Neurological: Positive for weakness and numbness (on the bottoms of both  feet). Negative for dizziness, tremors, speech difficulty and light-headedness.  Hematological: Negative for adenopathy. Does not bruise/bleed easily.  Psychiatric/Behavioral: Positive for decreased concentration, dysphoric mood and sleep disturbance. Negative for agitation, behavioral problems, confusion, hallucinations, self-injury and suicidal ideas. The patient is nervous/anxious. The patient is not hyperactive.      Objective:  BP 122/84   Pulse 78   Temp 98.2 F (36.8 C) (Oral)   Resp 16   Ht 5\' 9"  (1.753 m)   Wt 241 lb (109.3 kg)   SpO2 96%   BMI 35.59 kg/m   Physical Exam Vitals reviewed. Exam conducted with a chaperone present (his wife).  Constitutional:      General: He is not in acute distress.    Appearance: He is obese. He is ill-appearing. He is not toxic-appearing or diaphoretic.  HENT:     Nose: Nose normal.     Mouth/Throat:     Mouth: Mucous membranes are moist.  Eyes:     General: No scleral icterus.    Conjunctiva/sclera: Conjunctivae normal.  Cardiovascular:     Rate and Rhythm: Normal rate and regular rhythm.     Heart sounds: No murmur heard. No friction rub. No gallop.      Comments: EKG- NSR, 81 bpm Normal EKG Pulmonary:     Effort: Pulmonary effort is normal.     Breath sounds: No stridor. No wheezing, rhonchi or rales.  Abdominal:     General: Abdomen is protuberant. Bowel sounds are normal. There is no distension.     Palpations: Abdomen is soft. There is no fluid wave, hepatomegaly or mass.     Tenderness: There is no guarding.     Hernia: No hernia is present. There is no hernia in the left inguinal area or right inguinal area.  Genitourinary:    Pubic Area: No rash.      Penis: Normal and circumcised. No discharge, swelling or lesions.      Testes: Normal.     Epididymis:     Right: Normal.     Left: Normal.     Prostate: Enlarged (1+ smooth symm BPH). Not tender and no nodules present.     Rectum: Normal. Guaiac result negative. No mass, tenderness, anal fissure, external hemorrhoid or internal hemorrhoid. Normal anal tone.  Musculoskeletal:        General: No swelling or tenderness. Normal range of motion.     Cervical back: Neck supple.     Right lower leg: No edema.     Left lower leg: No edema.  Lymphadenopathy:     Cervical: No cervical adenopathy.     Lower Body: No right inguinal adenopathy. No left inguinal adenopathy.  Skin:     General: Skin is warm and dry.     Coloration: Skin is not pale.  Neurological:     General: No focal deficit present.     Mental Status: He is alert and oriented to person, place, and time. Mental status is at baseline.     Cranial Nerves: Cranial nerves are intact.     Sensory: Sensation is intact.     Motor: Motor function is intact. No weakness.     Coordination: Coordination is intact. Romberg sign negative. Coordination normal.     Deep Tendon Reflexes: Reflexes normal.     Reflex Scores:      Tricep reflexes are 1+ on the right side and 1+ on the left side.      Bicep reflexes are 1+ on  the right side and 1+ on the left side.      Brachioradialis reflexes are 1+ on the right side and 1+ on the left side.      Patellar reflexes are 1+ on the right side and 1+ on the left side.      Achilles reflexes are 1+ on the right side and 1+ on the left side. Psychiatric:        Attention and Perception: He is inattentive.        Mood and Affect: Mood normal. Affect is flat.        Speech: Speech is delayed and tangential.        Behavior: Behavior normal.        Thought Content: Thought content normal. Thought content is not paranoid or delusional. Thought content does not include homicidal or suicidal ideation.        Cognition and Memory: Cognition normal.        Judgment: Judgment normal.     Lab Results  Component Value Date   WBC 8.9 11/11/2020   HGB 15.0 11/11/2020   HCT 43.3 11/11/2020   PLT 253.0 11/11/2020   GLUCOSE 97 11/11/2020   CHOL 137 11/11/2020   TRIG (H) 11/11/2020    471.0 Triglyceride is over 400; calculations on Lipids are invalid.   HDL 38.80 (L) 11/11/2020   LDLDIRECT 52.0 11/11/2020   ALT 30 11/11/2020   AST 16 11/11/2020   NA 139 11/11/2020   K 4.2 11/11/2020   CL 104 11/11/2020   CREATININE 1.12 11/11/2020   BUN 17 11/11/2020   CO2 29 11/11/2020   TSH 2.33 11/11/2020   PSA 0.36 11/11/2020   HGBA1C 7.2 (A) 11/11/2020   MICROALBUR 0.9 11/11/2020    DG Chest 2 View  Result Date: 11/12/2020 CLINICAL DATA:  Cough, dyspnea. EXAM: CHEST - 2 VIEW COMPARISON:  Jan 23, 2017. FINDINGS: The heart size and mediastinal contours are within normal limits. Both lungs are clear. No pneumothorax or pleural effusion is noted. The visualized skeletal structures are unremarkable. IMPRESSION: No active cardiopulmonary disease. Electronically Signed   By: Marijo Conception M.D.   On: 11/12/2020 10:21    Assessment & Plan:   Jamerion was seen today for annual exam, anemia, copd and diabetes.  Diagnoses and all orders for this visit:  Encounter for general adult medical examination with abnormal findings - Exam completed, labs reviewed, vaccines reviewed and updated, cancer screenings addressed, patient education was given.  Hyperlipidemia with target LDL less than 100- He has achieved his LDL goal and is doing well on the statin. -     Lipid panel; Future -     TSH; Future -     Hepatic function panel; Future -     Hepatic function panel -     TSH -     Lipid panel -     simvastatin (ZOCOR) 20 MG tablet; Take 1 tablet (20 mg total) by mouth at bedtime.  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  Will screen for secondary causes of anemia. -     CBC with Differential/Platelet; Future -     Vitamin B12; Future -     Ferritin; Future -     Folate; Future -     Vitamin B1; Future -     Iron; Future -     Lactate dehydrogenase; Future -     Lactate dehydrogenase -     Iron -     Vitamin  B1 -     Folate -     Ferritin -     Vitamin B12 -     CBC with Differential/Platelet  Type II diabetes mellitus with complication ([place])- His Y0D is up to 7.2%.  I recommended that he treat this with an SGLT2 inhibitor and Metformin. -     Basic metabolic panel; Future -     Microalbumin / creatinine urine ratio; Future -     POCT glycosylated hemoglobin (Hb A1C) -     POCT Glucose (Device for Home Use) -     HM Diabetes Foot Exam -     Microalbumin / creatinine  urine ratio -     Basic metabolic panel -     Amb Referral to Nutrition and Diabetic Education -     Empagliflozin-metFORMIN HCl (SYNJARDY) 5-500 MG TABS; Take 1 tablet by mouth 2 (two) times daily.  Neuropathy-  The evaluation for secondary causes is unremarkable.  This may be related to DM 2 he or his lower back.  I have asked him to see neurology about this. -     CBC with Differential/Platelet; Future -     Vitamin B12; Future -     Vitamin B1; Future -     Vitamin B1 -     Vitamin B12 -     CBC with Differential/Platelet -     Ambulatory referral to Neurology  Benign prostatic hyperplasia with lower urinary tract symptoms, symptom details unspecified- His PSA is low.  This is a reassuring sign that he does not have prostatae cancer. -     PSA; Future -     PSA  Simple chronic bronchitis ([place]) -     Ambulatory referral to Pulmonology  Cough- His chest x-ray is negative for mass or infiltrate. -     DG Chest 2 View; Future -     Ambulatory referral to Pulmonology  Loud snoring -     Ambulatory referral to Pulmonology  DOE (dyspnea on exertion)- Based on his symptoms, exam, and EKG I think this is caused by pulmonary disease. -     EKG 12-Lead  Benign neoplasm of colon, unspecified part of colon -     Ambulatory referral to Gastroenterology  Bradycardia with 41-50 beats per minute  Pure hyperglyceridemia -     icosapent Ethyl (VASCEPA) 1 g capsule; Take 2 capsules (2 g total) by mouth 2 (two) times daily.  Need for DTP vaccine -     Tdap (BOOSTRIX) 5-2.5-18.5 LF-MCG/0.5 injection; Inject 0.5 mLs into the muscle once for 1 dose.  Other orders -     LDL cholesterol, direct   I have discontinued [name]. Schweer's metFORMIN. I have also changed his simvastatin. Additionally, I am having him start on Synjardy, icosapent Ethyl, and Boostrix. Lastly, I am having him maintain his fluvoxaMINE, QUEtiapine, ALPRAZolam, hydrOXYzine, and SUMAtriptan.  Meds ordered this  encounter  Medications  . Empagliflozin-metFORMIN HCl (SYNJARDY) 5-500 MG TABS    Sig: Take 1 tablet by mouth 2 (two) times daily.    Dispense:  180 tablet    Refill:  0  . simvastatin (ZOCOR) 20 MG tablet    Sig: Take 1 tablet (20 mg total) by mouth at bedtime.    Dispense:  90 tablet    Refill:  1  . icosapent Ethyl (VASCEPA) 1 g capsule    Sig: Take 2 capsules (2 g total) by mouth 2 (two) times daily.  Dispense:  360 capsule    Refill:  1  . Tdap (BOOSTRIX) 5-2.5-18.5 LF-MCG/0.5 injection    Sig: Inject 0.5 mLs into the muscle once for 1 dose.    Dispense:  0.5 mL    Refill:  0     Follow-up: Return in about 3 months (around 02/11/2021).  Scarlette Calico, MD

## 2020-11-11 NOTE — Patient Instructions (Signed)

## 2020-11-12 ENCOUNTER — Encounter: Payer: Self-pay | Admitting: Internal Medicine

## 2020-11-12 DIAGNOSIS — E781 Pure hyperglyceridemia: Secondary | ICD-10-CM | POA: Insufficient documentation

## 2020-11-12 LAB — FOLATE: Folate: 17.9 ng/mL

## 2020-11-12 LAB — TSH: TSH: 2.33 u[IU]/mL (ref 0.35–4.50)

## 2020-11-12 LAB — FERRITIN: Ferritin: 173.2 ng/mL (ref 22.0–322.0)

## 2020-11-12 LAB — VITAMIN B12: Vitamin B-12: 440 pg/mL (ref 211–911)

## 2020-11-12 LAB — PSA: PSA: 0.36 ng/mL (ref 0.10–4.00)

## 2020-11-12 MED ORDER — SIMVASTATIN 20 MG PO TABS: 20.0000 mg | ORAL_TABLET | Freq: Every day | ORAL | 1 refills | Status: DC

## 2020-11-12 MED ORDER — ICOSAPENT ETHYL 1 G PO CAPS: 2.0000 g | ORAL_CAPSULE | Freq: Two times a day (BID) | ORAL | 1 refills | Status: DC

## 2020-11-12 MED ORDER — SYNJARDY 5-500 MG PO TABS: 1.0000 | ORAL_TABLET | Freq: Two times a day (BID) | ORAL | 0 refills | Status: DC

## 2020-11-15 DIAGNOSIS — Z23 Encounter for immunization: Secondary | ICD-10-CM | POA: Insufficient documentation

## 2020-11-15 MED ORDER — BOOSTRIX 5-2.5-18.5 LF-MCG/0.5 IM SUSP
0.5000 mL | Freq: Once | INTRAMUSCULAR | 0 refills | Status: AC
Start: 2020-11-15 — End: 2020-11-15

## 2020-11-16 LAB — LACTATE DEHYDROGENASE: LDH: 147 U/L (ref 120–250)

## 2020-11-16 LAB — VITAMIN B1: Vitamin B1 (Thiamine): 18 nmol/L (ref 8–30)

## 2020-12-03 ENCOUNTER — Encounter: Payer: Self-pay | Admitting: Dietician

## 2020-12-03 ENCOUNTER — Other Ambulatory Visit: Payer: Self-pay

## 2020-12-03 ENCOUNTER — Encounter: Payer: Medicare Other | Attending: Internal Medicine | Admitting: Dietician

## 2020-12-03 DIAGNOSIS — E118 Type 2 diabetes mellitus with unspecified complications: Secondary | ICD-10-CM | POA: Insufficient documentation

## 2020-12-03 NOTE — Progress Notes (Signed)
Diabetes Self-Management Education  Visit Type: First/Initial  Appt. Start Time: 1400 Appt. End Time: 1500  12/03/2020  [name], identified by name and date of birth, is a 65 y.o. male with a diagnosis of Diabetes: Type 2.   ASSESSMENT Pt is present with their wife Jeremy Shannon.  Pt recently switched doctors due to being recommended not to check their sugar, and ending up going from prediabetes to diabetes during this period. Pt reports being frustrated by this. Pt wife reports pt gained a lot of weight in the past year after losing their mother. Pt stopped going on walks as much, and began emotional eating more. Pt was previously walking 4 miles a day. [facility] 3 times a week.  Pt wife reports pt eats very quickly. Pt reports cutting out soft drinks and sugars, still drinks sweet tea Pt reports generally being a 3 meal person, but will snack occasionally. Pt wife states that their daughter does cooking and grocery shopping occasionally. Pt has erratic sleep pattern, possible OSA. Pt reports snacking in the middle of the night when they can not sleep.  Height 5\' 9"  (1.753 m), weight 241 lb 6.4 oz (109.5 kg). Body mass index is 35.65 kg/m.   Diabetes Self-Management Education - 12/03/20 1417      Visit Information   Visit Type First/Initial      Initial Visit   Diabetes Type Type 2    Are you currently following a meal plan? No    Are you taking your medications as prescribed? Yes    Date Diagnosed February 2022      Health Coping   How would you rate your overall health? Good      Psychosocial Assessment   Self-management support None    Other persons present Patient;Spouse/SO    Patient Concerns Nutrition/Meal planning    Special Needs None    Learning Readiness Ready    How often do you need to have someone help you when you read instructions, pamphlets, or other written materials from your doctor or pharmacy? 1 - Never     What is the last grade level you completed in school? 2 years of college      Pre-Education Assessment   Patient understands the diabetes disease and treatment process. Needs Instruction    Patient understands incorporating nutritional management into lifestyle. Needs Instruction    Patient undertands incorporating physical activity into lifestyle. Needs Instruction    Patient understands using medications safely. Needs Instruction    Patient understands monitoring blood glucose, interpreting and using results Needs Instruction    Patient understands prevention, detection, and treatment of acute complications. Needs Instruction    Patient understands prevention, detection, and treatment of chronic complications. Needs Instruction    Patient understands how to develop strategies to address psychosocial issues. Needs Instruction    Patient understands how to develop strategies to promote health/change behavior. Needs Instruction      Complications   Last HgB A1C per patient/outside source 7.2 %   11/11/2020   How often do you check your blood sugar? 1-2 times/day    Fasting Blood glucose range (mg/dL) 70-129    Postprandial Blood glucose range (mg/dL) 130-179    Have you had a dilated eye exam in the past 12 months? Yes    Have you had a dental exam in the past 12 months? Yes    Are you checking your feet? No  Dietary Intake   Breakfast honey nut cheerios, skim milk    Snack (morning) none    Lunch Steak and mushroom sub, iced tea    Snack (afternoon) none    Dinner steak and mushroom sub    Snack (evening) none    Beverage(s) iced tea, bottle of water, skim milk      Exercise   Exercise Type ADL's;Light (walking / raking leaves)    How many days per week to you exercise? 3    How many minutes per day do you exercise? 60    Total minutes per week of exercise 180      Patient Education   Disease state  Definition of diabetes, type 1 and 2, and the diagnosis of diabetes;Factors  that contribute to the development of diabetes    Nutrition management  Role of diet in the treatment of diabetes and the relationship between the three main macronutrients and blood glucose level;Food label reading, portion sizes and measuring food.;Carbohydrate counting    Physical activity and exercise  Role of exercise on diabetes management, blood pressure control and cardiac health.    Medications Reviewed patients medication for diabetes, action, purpose, timing of dose and side effects.    Monitoring Purpose and frequency of SMBG.;Taught/discussed recording of test results and interpretation of SMBG.    Chronic complications Relationship between chronic complications and blood glucose control;Lipid levels, blood glucose control and heart disease    Psychosocial adjustment Role of stress on diabetes      Individualized Goals (developed by patient)   Nutrition Follow meal plan discussed    Physical Activity Exercise 3-5 times per week    Medications take my medication as prescribed    Monitoring  test my blood glucose as discussed;send in my blood glucose log as discussed      Post-Education Assessment   Patient understands the diabetes disease and treatment process. Needs Review    Patient understands incorporating nutritional management into lifestyle. Needs Review    Patient undertands incorporating physical activity into lifestyle. Needs Review    Patient understands using medications safely. Needs Review    Patient understands monitoring blood glucose, interpreting and using results Needs Review    Patient understands prevention, detection, and treatment of acute complications. Needs Review    Patient understands prevention, detection, and treatment of chronic complications. Needs Review    Patient understands how to develop strategies to address psychosocial issues. Needs Review      Outcomes   Expected Outcomes Demonstrated interest in learning. Expect positive outcomes     Future DMSE 4-6 wks    Program Status Not Completed           Individualized Plan for Diabetes Self-Management Training:   Learning Objective:  Patient will have a greater understanding of diabetes self-management. Patient education plan is to attend individual and/or group sessions per assessed needs and concerns.   Plan:   Patient Instructions  Keep up the great work [facility] to the [facility]! Aim for 150 minutes of physical each week, spread out over 4-5 days ideally!  Work towards eating three meals a day, about 5-6 hours apart!  Begin to recognize carbohydrates in your food choices!  Have 4 carb choices at each meal (60 g).   Begin to build your meals using the proportions of the Balanced Plate. . First, select your carb choice(s) for the meal, and determine how much you should have to equal 4 carb choices (60 g). . Next, select your  source of protein to pair with your carb choice(s). . Finally, complete the remaining half of your meal with a variety of non-starchy vegetables.  Check your blood sugar every morning before you eat, and 2 hours after you start a meal! Keep a log of these numbers for our next appointment!    Expected Outcomes:  Demonstrated interest in learning. Expect positive outcomes  Education material provided: ADA - How to Thrive: A Guide for Your Journey with Diabetes, Meal plan card and My Plate  If problems or questions, patient to contact team via:  Phone and Email  Future DSME appointment: 4-6 wks

## 2020-12-03 NOTE — Patient Instructions (Signed)
Keep [facility] the [facility]! Aim for 150 minutes of physical each week, spread out over 4-5 days ideally!  Work towards eating three meals a day, about 5-6 hours apart!  Begin to recognize carbohydrates in your food choices!  Have 4 carb choices at each meal (60 g).   Begin to build your meals using the proportions of the Balanced Plate. . First, select your carb choice(s) for the meal, and determine how much you should have to equal 4 carb choices (60 g). . Next, select your source of protein to pair with your carb choice(s). . Finally, complete the remaining half of your meal with a variety of non-starchy vegetables.  Check your blood sugar every morning before you eat, and 2 hours after you start a meal! Keep a log of these numbers for our next appointment!

## 2020-12-11 ENCOUNTER — Telehealth (INDEPENDENT_AMBULATORY_CARE_PROVIDER_SITE_OTHER): Payer: Medicare Other | Admitting: Internal Medicine

## 2020-12-11 ENCOUNTER — Other Ambulatory Visit: Payer: Self-pay

## 2020-12-11 ENCOUNTER — Encounter: Payer: Self-pay | Admitting: Internal Medicine

## 2020-12-11 DIAGNOSIS — J410 Simple chronic bronchitis: Secondary | ICD-10-CM

## 2020-12-11 MED ORDER — BENZONATATE 200 MG PO CAPS: 200.0000 mg | ORAL_CAPSULE | Freq: Three times a day (TID) | ORAL | 0 refills | Status: DC | PRN

## 2020-12-11 NOTE — Progress Notes (Signed)
Virtual Visit via Audio Note  I connected with Jeremy Shannon on 12/11/20 at 10:00 AM EDT by an audio-only enabled telemedicine application and verified that I am speaking with the correct person using two identifiers.  The patient and the provider were at separate locations throughout the entire encounter. Patient location: home, Provider location: work   I discussed the limitations of evaluation and management by telemedicine and the availability of in person appointments. The patient expressed understanding and agreed to proceed. The patient and the provider were the only parties present for the visit unless noted in HPI below.  History of Present Illness: The patient is a 65 y.o. man with visit for cough. Started 2 days ago. Has chronic bronchitis and is smoker. Non-productive. Taking mucinex which helps some. Denies fevers or chills. Denies nasal congestion or dripping. Some problems with breathing with this but this is not different than usual. Has had covid-19 2 shots and booster. Denies known sick contacts. Cough same as when it started. Smoking about a pack per day and this is the same as usual. Cough chronic but worse in the last 2 days.   Observations/Objective: A and O times 3, no coughing during visit, no dyspnea during visit  Assessment and Plan: See problem oriented charting  Follow Up Instructions: rx tessalon perles, encouraged smoking cessation  Visit time 11 minutes in face to face communication with patient and coordination of care.  I discussed the assessment and treatment plan with the patient. The patient was provided an opportunity to ask questions and all were answered. The patient agreed with the plan and demonstrated an understanding of the instructions.   The patient was advised to call back or seek an in-person evaluation if the symptoms worsen or if the condition fails to improve as anticipated.  Hoyt Koch, MD

## 2020-12-11 NOTE — Assessment & Plan Note (Signed)
No clear flare today. Rx tessalon perles and encouraged to stop smoking.

## 2020-12-11 NOTE — Assessment & Plan Note (Signed)
Suspect acute worsening of smoker's cough. Encouraged smoking cessation and counseled about risk/harm from cigarette smoker. Rx tessalon perles.

## 2020-12-24 ENCOUNTER — Institutional Professional Consult (permissible substitution): Payer: Medicare Other | Admitting: Internal Medicine

## 2021-01-15 ENCOUNTER — Encounter: Payer: Self-pay | Admitting: Pulmonary Disease

## 2021-01-15 ENCOUNTER — Other Ambulatory Visit: Payer: Self-pay

## 2021-01-15 ENCOUNTER — Ambulatory Visit (INDEPENDENT_AMBULATORY_CARE_PROVIDER_SITE_OTHER): Payer: Medicare Other | Admitting: Pulmonary Disease

## 2021-01-15 VITALS — BP 126/72 | HR 107 | Temp 97.3°F | Ht 68.0 in | Wt 237.0 lb

## 2021-01-15 DIAGNOSIS — J410 Simple chronic bronchitis: Secondary | ICD-10-CM

## 2021-01-15 DIAGNOSIS — F17210 Nicotine dependence, cigarettes, uncomplicated: Secondary | ICD-10-CM | POA: Diagnosis not present

## 2021-01-15 NOTE — Patient Instructions (Signed)
Snoring without nonrestorative sleep, without daytime symptoms -If your symptoms were to change let us know and we can order a sleep study  Recurrent bronchitis Active smoker, greater than 40-pack-year smoking history  -Low-dose CT scan for cancer screening  Obtain pulmonary function test  Follow-up in 4 to 6 weeks  Call with significant concerns

## 2021-01-15 NOTE — Progress Notes (Signed)
Jeremy Shannon    [id number]    July 09, 1956  Primary Care Physician:Jones, Arvid Right, MD  Referring Physician: [name], MD [address] [place],  [place] [address]  Chief complaint:   Patient referred for snoring, bronchitis  HPI:  Pack-a-day smoker for over 40 years No significant limitations  Has had problems of bronchitis Cough, dry  Has been told about snoring No witnessed apneas States he goes to bed about 11 PM final up time of 6 to 6:30 AM Will usually wake up about 230 and then a couple more times before getting up in the morning Weight is up about 20 pounds  Denies sleepiness during the day Denies headaches in the mornings Denies dryness of his mouth in the mornings Memory is good  Denies excessive daytime sleepiness Denies feeling tired during the day at all  He does have a history of anxiety/depression  No contributory occupational history  Outpatient Encounter Medications as of 01/15/2021  Medication Sig  . ALPRAZolam (XANAX) 1 MG tablet Take 1 mg by mouth 4 (four) times daily as needed for anxiety.  . benzonatate (TESSALON) 200 MG capsule Take 1 capsule (200 mg total) by mouth 3 (three) times daily as needed.  . Empagliflozin-metFORMIN HCl (SYNJARDY) 5-500 MG TABS Take 1 tablet by mouth 2 (two) times daily.  . fluvoxaMINE (LUVOX) 100 MG tablet Take 200 mg by mouth at bedtime.   . hydrOXYzine (ATARAX/VISTARIL) 25 MG tablet Take 1 tablet (25 mg total) by mouth every 8 (eight) hours as needed for anxiety.  Marland Kitchen icosapent Ethyl (VASCEPA) 1 g capsule Take 2 capsules (2 g total) by mouth 2 (two) times daily.  . QUEtiapine (SEROQUEL) 100 MG tablet Take 300 mg by mouth at bedtime.   . simvastatin (ZOCOR) 20 MG tablet Take 1 tablet (20 mg total) by mouth at bedtime.  . SUMAtriptan (IMITREX) 50 MG tablet Take 1 tablet (50 mg total) by mouth once for 1 dose. May repeat in 2 hours if headache persists or recurs.   No facility-administered  encounter medications on file as of 01/15/2021.    Allergies as of 01/15/2021  . (No Known Allergies)    Past Medical History:  Diagnosis Date  . Anemia   . Anxiety   . Cataract   . Depression   . Diabetes mellitus without complication ([facility])    type 2  . Hyperlipidemia   . PONV (postoperative nausea and vomiting)     Past Surgical History:  Procedure Laterality Date  . CATARACT EXTRACTION     bilateral  . CHOLECYSTECTOMY N/A 02/10/2017   Procedure: LAPAROSCOPIC CHOLECYSTECTOMY WITH INTRAOPERATIVE CHOLANGIOGRAM;  Surgeon: Alphonsa Overall, MD;  Location: [facility];  Service: General;  Laterality: N/A;    Family History  Problem Relation Age of Onset  . Liver cancer Mother   . Depression Father   . Drug abuse Father     Social History   Socioeconomic History  . Marital status: Married    Spouse name: Not on file  . Number of children: Not on file  . Years of education: Not on file  . Highest education level: Not on file  Occupational History  . Not on file  Tobacco Use  . Smoking status: Current Every Day Smoker    Packs/day: 1.50    Years: 43.00    Pack years: 64.50    Types: Cigarettes  . Smokeless tobacco: Never Used  . Tobacco comment: down to  1 ppd   Substance and Sexual Activity  . Alcohol use: No  . Drug use: No  . Sexual activity: Yes    Partners: Female    Birth control/protection: None  Other Topics Concern  . Not on file  Social History Narrative  . Not on file   Social Determinants of Health   Financial Resource Strain: Not on file  Food Insecurity: Not on file  Transportation Needs: Not on file  Physical Activity: Not on file  Stress: Not on file  Social Connections: Not on file  Intimate Partner Violence: Not on file    Review of Systems  Respiratory: Positive for cough.   Psychiatric/Behavioral: Positive for sleep disturbance.    Vitals:   01/15/21 1330  BP: 126/72  Pulse: (!) 107  Temp: (!) 97.3 F (36.3 C)  SpO2: 95%      Physical Exam Constitutional:      Appearance: He is obese.  HENT:     Head: Normocephalic and atraumatic.     Right Ear: Tympanic membrane normal.     Nose: Nose normal.     Mouth/Throat:     Mouth: Mucous membranes are moist.     Comments: Mallampati 4, crowded oropharynx Eyes:     General:        Right eye: No discharge.        Left eye: No discharge.  Cardiovascular:     Rate and Rhythm: Normal rate and regular rhythm.     Heart sounds: No murmur heard. No friction rub.  Pulmonary:     Effort: No respiratory distress.     Breath sounds: No stridor. No wheezing or rhonchi.  Musculoskeletal:     Cervical back: No rigidity or tenderness.  Neurological:     Mental Status: He is alert.   No flowsheet data found.  Epworth Sleepiness Scale of 0 today  Data Reviewed: Recent chest x-ray reviewed showing no acute infiltrate  Assessment:  Cough  Recurrent bronchitis  Snoring -Denies daytime symptoms -He feels his sleep is restorative -Denies any daytime fatigue  Active smoker Over 40-pack-year smoking history  Plan/Recommendations:  Low-dose cancer screening-order CT  Obtain pulmonary function test  Follow-up in about 4 to 6 weeks  May have primary snoring however I do believe he may have obstructive sleep apnea, denies any significant symptoms He does not seem concerned about his snoring Because the probability of significant sleep apnea is not high-an in lab polysomnogram will be required if he decides to have testing performed  Smoking cessation counseling     Sherrilyn Rist MD [facility] 01/15/2021, 1:51 PM  CC: [name], MD

## 2021-01-20 ENCOUNTER — Encounter: Payer: Medicare Other | Attending: Internal Medicine | Admitting: Dietician

## 2021-01-20 ENCOUNTER — Other Ambulatory Visit: Payer: Self-pay

## 2021-01-20 ENCOUNTER — Encounter: Payer: Self-pay | Admitting: Dietician

## 2021-01-20 DIAGNOSIS — E118 Type 2 diabetes mellitus with unspecified complications: Secondary | ICD-10-CM

## 2021-01-20 NOTE — Patient Instructions (Addendum)
Try [place] bacon or [place] sausage for breakfast to reduce saturated fat and sodium.  Work towards choosing nutrient dense foods to snack on when you are hungry. Choose non-starchy vegetables, fruits,  Whole grain crackers, low-fat or fat free dairy.  Try to slow down eating. Take one bite and set down your utensils, chew it thoroughly and swallow, then pick up your utensils again and have another bite.  When you feel a strong impulse to eat, acknowledge impulse and speak it out loud. Keep working on this consistently. The more you acknowledge your impulses, the more likely it is for you to recognize whether you are eating due to impulse or actual hunger.  Create a list of activities you would consider doing during the week. Keep trying to stay active doing things you enjoy!!

## 2021-01-20 NOTE — Progress Notes (Signed)
Diabetes Self-Management Education  Visit Type: Follow-up  Appt. Start Time: 1400 Appt. End Time: 1440  01/20/2021  [name], identified by name and date of birth, is a 65 y.o. male with a diagnosis of Diabetes:  .   ASSESSMENT Pt is present with their wife Jeremy Shannon.  Pt states they are eating a little better, but started slipping recently. Pt has been drinking more water lately. Pt has been eating [employer] Frostys. Pt has been drinking a lot of orange juice and lemonade. Pt attributes the slip ups to poor impulse control. Pt will consume junkfood repetitively once they get a taste for it. Pt is [facility] and [facility]. Pt wife was going to the [facility] with them but had to stop. Pt states this has contributed to them going less because they want someone to exercise with.  Early after our last appointment FBG were between 100-120. FBG values are now usually around 140-150. Pt has been logging their BG daily. Pt reports feeling tired all the time. Pt is seeing a pulmonologist, and will be getting a CT scan on their chest. Pt reports liking to play golf, but has not been to play in a long time. Pt daughter recently had emergency surgery and they are caring for her. This has been an emotional influence on pt's dietary habits.  Height 5\' 9"  (1.753 m), weight 238 lb 11.2 oz (108.3 kg). Body mass index is 35.25 kg/m.   Diabetes Self-Management Education - 01/20/21 1419      Visit Information   Visit Type Follow-up      Dietary Intake   Breakfast None    Snack (morning) none    Lunch [employer] hamburger, frosty    Snack (afternoon) none    Dinner [employer] and mushroom sub, sweet tea    Snack (evening) none    Beverage(s) Sweet tea, water      Exercise   Exercise Type ADL's   Sedentary     Patient Self-Evaluation of Goals - Patient rates self as meeting previously set goals (% of time)   Nutrition 25 - 50%    Physical Activity < 25%    Medications 25 - 50%     Monitoring >75%    Problem Solving < 25%    Reducing Risk < 25%    Health Coping < 25%      Post-Education Assessment   Patient understands the diabetes disease and treatment process. Needs Review    Patient understands incorporating nutritional management into lifestyle. Needs Review    Patient undertands incorporating physical activity into lifestyle. Needs Review    Patient understands using medications safely. Needs Review    Patient understands monitoring blood glucose, interpreting and using results Needs Review    Patient understands prevention, detection, and treatment of acute complications. Needs Review    Patient understands prevention, detection, and treatment of chronic complications. Needs Review    Patient understands how to develop strategies to address psychosocial issues. Needs Review    Patient understands how to develop strategies to promote health/change behavior. Needs Review      Outcomes   Expected Outcomes Demonstrated limited interest in learning.  Expect minimal changes    Future DMSE 3-4 months    Program Status Not Completed      Subsequent Visit   Since your last visit have you continued or begun to take your medications as prescribed? Yes    Since your last visit have you had your blood  pressure checked? No    Since your last visit have you experienced any weight changes? Loss    Weight Loss (lbs) 3    Since your last visit, are you checking your blood glucose at least once a day? Yes           Individualized Plan for Diabetes Self-Management Training:   Learning Objective:  Patient will have a greater understanding of diabetes self-management. Patient education plan is to attend individual and/or group sessions per assessed needs and concerns.   Plan:   Patient Instructions  Try [place] bacon or [place] sausage for breakfast to reduce saturated fat and sodium.  Work towards choosing nutrient dense foods to snack on when you are hungry. Choose  non-starchy vegetables, fruits,  Whole grain crackers, low-fat or fat free dairy.  Try to slow down eating. Take one bite and set down your utensils, chew it thoroughly and swallow, then pick up your utensils again and have another bite.  When you feel a strong impulse to eat, acknowledge impulse and speak it out loud. Keep working on this consistently. The more you acknowledge your impulses, the more likely it is for you to recognize whether you are eating due to impulse or actual hunger.  Create a list of activities you would consider doing during the week. Keep trying to stay active doing things you enjoy!!      Expected Outcomes:  Demonstrated limited interest in learning.  Expect minimal changes  If problems or questions, patient to contact team via:  Phone and Email  Future DSME appointment: 3-4 months

## 2021-01-21 ENCOUNTER — Ambulatory Visit (INDEPENDENT_AMBULATORY_CARE_PROVIDER_SITE_OTHER)
Admission: RE | Admit: 2021-01-21 | Discharge: 2021-01-21 | Disposition: A | Discharge status: Home / Self Care | Payer: Medicare Other | Source: Ambulatory Visit | Attending: Pulmonary Disease | Admitting: Pulmonary Disease

## 2021-01-21 DIAGNOSIS — F17210 Nicotine dependence, cigarettes, uncomplicated: Secondary | ICD-10-CM

## 2021-01-21 DIAGNOSIS — J410 Simple chronic bronchitis: Secondary | ICD-10-CM

## 2021-01-22 ENCOUNTER — Telehealth: Payer: Self-pay | Admitting: Pulmonary Disease

## 2021-01-22 NOTE — Telephone Encounter (Signed)
Call report from [facility]  IMPRESSION: 1. Lung-RADS 4B, suspicious. Additional imaging evaluation or consultation with Pulmonology or Thoracic Surgery recommended. 2. Coronary artery calcifications. 3. Hepatic steatosis.  Aortic Atherosclerosis (ICD10-I70.0) and Emphysema (ICD10-J43.9).  These results will be called to the ordering clinician or representative by the Radiologist Assistant, and communication documented in the PACS or [employer].   Electronically Signed   By: Kerby Moors M.D.   On: 01/22/2021 15:56

## 2021-01-25 ENCOUNTER — Telehealth: Payer: Self-pay | Admitting: Acute Care

## 2021-01-25 NOTE — Telephone Encounter (Signed)
Noted  

## 2021-01-25 NOTE — Telephone Encounter (Signed)
We will call this through the screening program. Thanks

## 2021-01-25 NOTE — Telephone Encounter (Signed)
I have called the patient to give him the results of his low dose CT. Lung RADS 4 B indicates suspicious findings for which additional diagnostic testing and or tissue sampling is recommended.There was no answer. I have left a HIPPA compliant message on the patient's answering machine requesting he call the office tomorrow as it is after 5 and phones are off. Will call again tomorrow. This will be called by the screening program.

## 2021-01-26 ENCOUNTER — Other Ambulatory Visit: Payer: Self-pay | Admitting: Acute Care

## 2021-01-26 DIAGNOSIS — R911 Solitary pulmonary nodule: Secondary | ICD-10-CM

## 2021-01-26 NOTE — Telephone Encounter (Signed)
Schedule with Dr. Valeta Harms or Lamonte Sakai

## 2021-01-26 NOTE — Telephone Encounter (Signed)
I have called the patient with the results of his LDCT. I explained that there is a mass that we need to look at more closely. I explained that we will get him scheduled with Dr. Valeta Harms this Friday. Dr. Juline Patch nurse will call the patient to confirm the time. Pt. Verbalized understanding and had no further questions upon completion of the call.

## 2021-01-26 NOTE — Telephone Encounter (Signed)
I have attempted to call the patient. There was no answer. Left message requesting a call back. I will attempt again tomorrow .

## 2021-01-26 NOTE — Telephone Encounter (Signed)
I have returned the call to the patient. The call went to voice mail. I have left a HiPPA compliant  message asking the patient to call the office and ask to speak to me for the results of his scan. I have left the contact information on the voice mail . I will await the return call.

## 2021-01-26 NOTE — Telephone Encounter (Signed)
SG pt is calling back about these results.  Thanks

## 2021-01-28 NOTE — Telephone Encounter (Signed)
Pt is scheduled to See Dr Valeta Harms 01/29/21 3:00.

## 2021-01-29 ENCOUNTER — Ambulatory Visit: Payer: Medicare Other | Admitting: Pulmonary Disease

## 2021-01-29 ENCOUNTER — Encounter: Payer: Self-pay | Admitting: Pulmonary Disease

## 2021-01-29 ENCOUNTER — Other Ambulatory Visit: Payer: Self-pay

## 2021-01-29 VITALS — BP 112/70 | HR 94 | Temp 97.5°F | Ht 68.5 in | Wt 241.0 lb

## 2021-01-29 DIAGNOSIS — F17200 Nicotine dependence, unspecified, uncomplicated: Secondary | ICD-10-CM | POA: Diagnosis not present

## 2021-01-29 DIAGNOSIS — R918 Other nonspecific abnormal finding of lung field: Secondary | ICD-10-CM

## 2021-01-29 MED ORDER — TRELEGY ELLIPTA 100-62.5-25 MCG/INH IN AEPB: 1.0000 | INHALATION_SPRAY | Freq: Every day | RESPIRATORY_TRACT | 2 refills | Status: DC

## 2021-01-29 MED ORDER — TRELEGY ELLIPTA 100-62.5-25 MCG/INH IN AEPB: 1.0000 | INHALATION_SPRAY | Freq: Every day | RESPIRATORY_TRACT | 0 refills | Status: DC

## 2021-01-29 NOTE — Progress Notes (Signed)
Synopsis: Referred in May 2022 for abnormal CT chest by [name], MD  Subjective:   PATIENT ID: Jeremy Shannon Pho GENDER: male DOB: [birth year], MRN: [id number]  Chief Complaint  Patient presents with  . Consult    CT scan SOB     This is a 65 year old gentleman, past medical history of hyperlipidemia, type 2 diabetes.  Patient is a current every day smoker, greater than 60+ pack year history.  Patient had a lung cancer screening CT that was completed on 01/21/2021.  Patient's lung cancer screening CT revealed centrilobular emphysema and masslike appearance in the posterior right lung base.  At approximately 32.5 mm.  No other suspicious lesions identified.  Patient had a nuclear medicine PET scan that was ordered by Eric Form, NP lung cancer screening coordinator which is scheduled for 02/10/2021.  Unfortunately the patient is still smoking.  Is at least a half a pack per day.  He has some interest in quitting but knows it is going to be difficult.  Wife is present with him today in the office.  We reviewed CT imaging that was completed during lung cancer screening.     Past Medical History:  Diagnosis Date  . Anemia   . Anxiety   . Cataract   . Depression   . Diabetes mellitus without complication ([facility])    type 2  . Hyperlipidemia   . PONV (postoperative nausea and vomiting)      Family History  Problem Relation Age of Onset  . Liver cancer Mother   . Depression Father   . Drug abuse Father      Past Surgical History:  Procedure Laterality Date  . CATARACT EXTRACTION     bilateral  . CHOLECYSTECTOMY N/A 02/10/2017   Procedure: LAPAROSCOPIC CHOLECYSTECTOMY WITH INTRAOPERATIVE CHOLANGIOGRAM;  Surgeon: Alphonsa Overall, MD;  Location: [facility];  Service: General;  Laterality: N/A;    Social History   Socioeconomic History  . Marital status: Married    Spouse name: Not on file  . Number of children: Not on file  . Years of education: Not on file  . Highest education  level: Not on file  Occupational History  . Not on file  Tobacco Use  . Smoking status: Current Every Day Smoker    Packs/day: 1.50    Years: 43.00    Pack years: 64.50    Types: Cigarettes  . Smokeless tobacco: Never Used  . Tobacco comment: 10-20 cigarettes smoked daily 01/29/21 [facility]   Substance and Sexual Activity  . Alcohol use: No  . Drug use: No  . Sexual activity: Yes    Partners: Female    Birth control/protection: None  Other Topics Concern  . Not on file  Social History Narrative  . Not on file   Social Determinants of Health   Financial Resource Strain: Not on file  Food Insecurity: Not on file  Transportation Needs: Not on file  Physical Activity: Not on file  Stress: Not on file  Social Connections: Not on file  Intimate Partner Violence: Not on file     No Known Allergies   Outpatient Medications Prior to Visit  Medication Sig Dispense Refill  . ALPRAZolam (XANAX) 1 MG tablet Take 1 mg by mouth 4 (four) times daily as needed for anxiety.    . Empagliflozin-metFORMIN HCl (SYNJARDY) 5-500 MG TABS Take 1 tablet by mouth 2 (two) times daily. 180 tablet 0  . fluvoxaMINE (LUVOX) 100 MG tablet Take 200 mg by mouth  at bedtime.     . hydrOXYzine (ATARAX/VISTARIL) 25 MG tablet Take 1 tablet (25 mg total) by mouth every 8 (eight) hours as needed for anxiety. 12 tablet 0  . icosapent Ethyl (VASCEPA) 1 g capsule Take 2 capsules (2 g total) by mouth 2 (two) times daily. 360 capsule 1  . QUEtiapine (SEROQUEL) 100 MG tablet Take 300 mg by mouth at bedtime.     . simvastatin (ZOCOR) 20 MG tablet Take 1 tablet (20 mg total) by mouth at bedtime. 90 tablet 1  . benzonatate (TESSALON) 200 MG capsule Take 1 capsule (200 mg total) by mouth 3 (three) times daily as needed. (Patient not taking: Reported on 01/29/2021) 60 capsule 0  . SUMAtriptan (IMITREX) 50 MG tablet Take 1 tablet (50 mg total) by mouth once for 1 dose. May repeat in 2 hours if headache persists or recurs. 10 tablet 0    No facility-administered medications prior to visit.    Review of Systems  Constitutional: Negative for chills, fever, malaise/fatigue and weight loss.  HENT: Negative for hearing loss, sore throat and tinnitus.   Eyes: Negative for blurred vision and double vision.  Respiratory: Positive for cough and shortness of breath. Negative for hemoptysis, sputum production, wheezing and stridor.   Cardiovascular: Negative for chest pain, palpitations, orthopnea, leg swelling and PND.  Gastrointestinal: Negative for abdominal pain, constipation, diarrhea, heartburn, nausea and vomiting.  Genitourinary: Negative for dysuria, hematuria and urgency.  Musculoskeletal: Negative for joint pain and myalgias.  Skin: Negative for itching and rash.  Neurological: Negative for dizziness, tingling, weakness and headaches.  Endo/Heme/Allergies: Negative for environmental allergies. Does not bruise/bleed easily.  Psychiatric/Behavioral: Negative for depression. The patient is not nervous/anxious and does not have insomnia.   All other systems reviewed and are negative.    Objective:  Physical Exam Vitals reviewed.  Constitutional:      General: He is not in acute distress.    Appearance: He is well-developed. He is obese.  HENT:     Head: Normocephalic and atraumatic.  Eyes:     General: No scleral icterus.    Conjunctiva/sclera: Conjunctivae normal.     Pupils: Pupils are equal, round, and reactive to light.  Neck:     Vascular: No JVD.     Trachea: No tracheal deviation.  Cardiovascular:     Rate and Rhythm: Normal rate and regular rhythm.     Heart sounds: Normal heart sounds. No murmur heard.   Pulmonary:     Effort: Pulmonary effort is normal. No tachypnea, accessory muscle usage or respiratory distress.     Breath sounds: No stridor. Wheezing present. No rhonchi or rales.  Abdominal:     General: Bowel sounds are normal. There is no distension.     Palpations: Abdomen is soft.      Tenderness: There is no abdominal tenderness.  Musculoskeletal:        General: No tenderness.     Cervical back: Neck supple.  Lymphadenopathy:     Cervical: No cervical adenopathy.  Skin:    General: Skin is warm and dry.     Capillary Refill: Capillary refill takes less than 2 seconds.     Findings: No rash.  Neurological:     Mental Status: He is alert and oriented to person, place, and time.  Psychiatric:        Behavior: Behavior normal.      Vitals:   01/29/21 1535  BP: 112/70  Pulse: 94  Temp: (!) 97.5  F (36.4 C)  TempSrc: Temporal  SpO2: 96%  Weight: 241 lb (109.3 kg)  Height: 5' 8.5" (1.74 m)   96% on RA BMI Readings from Last 3 Encounters:  01/29/21 36.11 kg/m  01/20/21 35.25 kg/m  01/15/21 36.04 kg/m   Wt Readings from Last 3 Encounters:  01/29/21 241 lb (109.3 kg)  01/20/21 238 lb 11.2 oz (108.3 kg)  01/15/21 237 lb (107.5 kg)     CBC    Component Value Date/Time   WBC 8.9 11/11/2020 1217   RBC 4.69 11/11/2020 1217   HGB 15.0 11/11/2020 1217   HCT 43.3 11/11/2020 1217   PLT 253.0 11/11/2020 1217   MCV 92.4 11/11/2020 1217   MCH 31.0 02/08/2017 0939   MCHC 34.6 11/11/2020 1217   RDW 13.4 11/11/2020 1217   LYMPHSABS 2.4 11/11/2020 1217   MONOABS 0.6 11/11/2020 1217   EOSABS 0.1 11/11/2020 1217   BASOSABS 0.1 11/11/2020 1217    Chest Imaging: Lung cancer screening CT completed on 01/21/2021 Has a spiculated appearing 32 mm posterior right lung masslike density concerning for malignancy. The patient's images have been independently reviewed by me.    Pulmonary Functions Testing Results: No flowsheet data found.  FeNO: no  Pathology: no  Echocardiogram: no  Heart Catheterization: no    Assessment & Plan:     ICD-10-CM   1. Lung mass  R91.8 CT Super D Chest Wo Contrast    Ambulatory referral to Pulmonology    Procedural/ Surgical Case Request: VIDEO BRONCHOSCOPY WITH ENDOBRONCHIAL NAVIGATION    CANCELED: Ambulatory referral  to Pulmonology  2. Current smoker  F17.200   3. Abnormal CT lung screening  R91.8     Discussion:  This is a 65 year old gentleman, lung cancer screening CT that was completed in May 2022 which was abnormal.  Has a right lower lobe masslike spiculated density concerning for a malignancy.  Plan: We discussed risk benefits alternatives of proceeding with tissue biopsy. Patient is a current active smoker and not interested in quitting at this time. His wife and him are going to work on this. We discussed smoking cessation today in the office. Would recommend in the meantime the patient undergo nuclear medicine pet imaging for further evaluation of the density. He would like to get undergo biopsy.  To determine what this is. I have discussed risk benefits and alternatives with the patient regarding navigational bronchoscopy. Patient is agreeable to proceed. I have ordered super D CT imaging. Will recommend follow-up with me after tissue biopsy.  Patient likely has COPD.  Undiagnosed, no prior PFTs. Will start triple therapy inhaler regimen with samples of Trelegy.  Return to clinic in approximately 6 weeks  Additional time spent today reviewing images with patient's wife and patient at bedside.   Current Outpatient Medications:  .  ALPRAZolam (XANAX) 1 MG tablet, Take 1 mg by mouth 4 (four) times daily as needed for anxiety., Disp: , Rfl:  .  Empagliflozin-metFORMIN HCl (SYNJARDY) 5-500 MG TABS, Take 1 tablet by mouth 2 (two) times daily., Disp: 180 tablet, Rfl: 0 .  Fluticasone-Umeclidin-Vilant (TRELEGY ELLIPTA) 100-62.5-25 MCG/INH AEPB, Inhale 1 puff into the lungs daily., Disp: 60 each, Rfl: 2 .  Fluticasone-Umeclidin-Vilant (TRELEGY ELLIPTA) 100-62.5-25 MCG/INH AEPB, Inhale 1 puff into the lungs daily., Disp: 28 each, Rfl: 0 .  fluvoxaMINE (LUVOX) 100 MG tablet, Take 200 mg by mouth at bedtime. , Disp: , Rfl:  .  hydrOXYzine (ATARAX/VISTARIL) 25 MG tablet, Take 1 tablet (25 mg  total) by  mouth every 8 (eight) hours as needed for anxiety., Disp: 12 tablet, Rfl: 0 .  icosapent Ethyl (VASCEPA) 1 g capsule, Take 2 capsules (2 g total) by mouth 2 (two) times daily., Disp: 360 capsule, Rfl: 1 .  QUEtiapine (SEROQUEL) 100 MG tablet, Take 300 mg by mouth at bedtime. , Disp: , Rfl:  .  simvastatin (ZOCOR) 20 MG tablet, Take 1 tablet (20 mg total) by mouth at bedtime., Disp: 90 tablet, Rfl: 1 .  benzonatate (TESSALON) 200 MG capsule, Take 1 capsule (200 mg total) by mouth 3 (three) times daily as needed. (Patient not taking: Reported on 01/29/2021), Disp: 60 capsule, Rfl: 0 .  SUMAtriptan (IMITREX) 50 MG tablet, Take 1 tablet (50 mg total) by mouth once for 1 dose. May repeat in 2 hours if headache persists or recurs., Disp: 10 tablet, Rfl: 0  I spent 63 minutes dedicated to the care of this patient on the date of this encounter to include pre-visit review of records, face-to-face time with the patient discussing conditions above, post visit ordering of testing, clinical documentation with the electronic health record, making appropriate referrals as documented, and communicating necessary findings to members of the patients care team.   Garner Nash, [facility] 01/29/2021 6:25 PM

## 2021-01-29 NOTE — H&P (View-Only) (Signed)
Synopsis: Referred in May 2022 for abnormal CT chest by [name], MD  Subjective:   PATIENT ID: Jeremy Shannon GENDER: male DOB: [birth year], MRN: [id number]  Chief Complaint  Patient presents with  . Consult    CT scan SOB     This is a 65 year old gentleman, past medical history of hyperlipidemia, type 2 diabetes.  Patient is a current every day smoker, greater than 60+ pack year history.  Patient had a lung cancer screening CT that was completed on 01/21/2021.  Patient's lung cancer screening CT revealed centrilobular emphysema and masslike appearance in the posterior right lung base.  At approximately 32.5 mm.  No other suspicious lesions identified.  Patient had a nuclear medicine PET scan that was ordered by Eric Form, NP lung cancer screening coordinator which is scheduled for 02/10/2021.  Unfortunately the patient is still smoking.  Is at least a half a pack per day.  He has some interest in quitting but knows it is going to be difficult.  Wife is present with him today in the office.  We reviewed CT imaging that was completed during lung cancer screening.     Past Medical History:  Diagnosis Date  . Anemia   . Anxiety   . Cataract   . Depression   . Diabetes mellitus without complication ([facility])    type 2  . Hyperlipidemia   . PONV (postoperative nausea and vomiting)      Family History  Problem Relation Age of Onset  . Liver cancer Mother   . Depression Father   . Drug abuse Father      Past Surgical History:  Procedure Laterality Date  . CATARACT EXTRACTION     bilateral  . CHOLECYSTECTOMY N/A 02/10/2017   Procedure: LAPAROSCOPIC CHOLECYSTECTOMY WITH INTRAOPERATIVE CHOLANGIOGRAM;  Surgeon: Alphonsa Overall, MD;  Location: [facility];  Service: General;  Laterality: N/A;    Social History   Socioeconomic History  . Marital status: Married    Spouse name: Not on file  . Number of children: Not on file  . Years of education: Not on file  . Highest education  level: Not on file  Occupational History  . Not on file  Tobacco Use  . Smoking status: Current Every Day Smoker    Packs/day: 1.50    Years: 43.00    Pack years: 64.50    Types: Cigarettes  . Smokeless tobacco: Never Used  . Tobacco comment: 10-20 cigarettes smoked daily 01/29/21 [facility]   Substance and Sexual Activity  . Alcohol use: No  . Drug use: No  . Sexual activity: Yes    Partners: Female    Birth control/protection: None  Other Topics Concern  . Not on file  Social History Narrative  . Not on file   Social Determinants of Health   Financial Resource Strain: Not on file  Food Insecurity: Not on file  Transportation Needs: Not on file  Physical Activity: Not on file  Stress: Not on file  Social Connections: Not on file  Intimate Partner Violence: Not on file     No Known Allergies   Outpatient Medications Prior to Visit  Medication Sig Dispense Refill  . ALPRAZolam (XANAX) 1 MG tablet Take 1 mg by mouth 4 (four) times daily as needed for anxiety.    . Empagliflozin-metFORMIN HCl (SYNJARDY) 5-500 MG TABS Take 1 tablet by mouth 2 (two) times daily. 180 tablet 0  . fluvoxaMINE (LUVOX) 100 MG tablet Take 200 mg by mouth  at bedtime.     . hydrOXYzine (ATARAX/VISTARIL) 25 MG tablet Take 1 tablet (25 mg total) by mouth every 8 (eight) hours as needed for anxiety. 12 tablet 0  . icosapent Ethyl (VASCEPA) 1 g capsule Take 2 capsules (2 g total) by mouth 2 (two) times daily. 360 capsule 1  . QUEtiapine (SEROQUEL) 100 MG tablet Take 300 mg by mouth at bedtime.     . simvastatin (ZOCOR) 20 MG tablet Take 1 tablet (20 mg total) by mouth at bedtime. 90 tablet 1  . benzonatate (TESSALON) 200 MG capsule Take 1 capsule (200 mg total) by mouth 3 (three) times daily as needed. (Patient not taking: Reported on 01/29/2021) 60 capsule 0  . SUMAtriptan (IMITREX) 50 MG tablet Take 1 tablet (50 mg total) by mouth once for 1 dose. May repeat in 2 hours if headache persists or recurs. 10 tablet 0    No facility-administered medications prior to visit.    Review of Systems  Constitutional: Negative for chills, fever, malaise/fatigue and weight loss.  HENT: Negative for hearing loss, sore throat and tinnitus.   Eyes: Negative for blurred vision and double vision.  Respiratory: Positive for cough and shortness of breath. Negative for hemoptysis, sputum production, wheezing and stridor.   Cardiovascular: Negative for chest pain, palpitations, orthopnea, leg swelling and PND.  Gastrointestinal: Negative for abdominal pain, constipation, diarrhea, heartburn, nausea and vomiting.  Genitourinary: Negative for dysuria, hematuria and urgency.  Musculoskeletal: Negative for joint pain and myalgias.  Skin: Negative for itching and rash.  Neurological: Negative for dizziness, tingling, weakness and headaches.  Endo/Heme/Allergies: Negative for environmental allergies. Does not bruise/bleed easily.  Psychiatric/Behavioral: Negative for depression. The patient is not nervous/anxious and does not have insomnia.   All other systems reviewed and are negative.    Objective:  Physical Exam Vitals reviewed.  Constitutional:      General: He is not in acute distress.    Appearance: He is well-developed. He is obese.  HENT:     Head: Normocephalic and atraumatic.  Eyes:     General: No scleral icterus.    Conjunctiva/sclera: Conjunctivae normal.     Pupils: Pupils are equal, round, and reactive to light.  Neck:     Vascular: No JVD.     Trachea: No tracheal deviation.  Cardiovascular:     Rate and Rhythm: Normal rate and regular rhythm.     Heart sounds: Normal heart sounds. No murmur heard.   Pulmonary:     Effort: Pulmonary effort is normal. No tachypnea, accessory muscle usage or respiratory distress.     Breath sounds: No stridor. Wheezing present. No rhonchi or rales.  Abdominal:     General: Bowel sounds are normal. There is no distension.     Palpations: Abdomen is soft.      Tenderness: There is no abdominal tenderness.  Musculoskeletal:        General: No tenderness.     Cervical back: Neck supple.  Lymphadenopathy:     Cervical: No cervical adenopathy.  Skin:    General: Skin is warm and dry.     Capillary Refill: Capillary refill takes less than 2 seconds.     Findings: No rash.  Neurological:     Mental Status: He is alert and oriented to person, place, and time.  Psychiatric:        Behavior: Behavior normal.      Vitals:   01/29/21 1535  BP: 112/70  Pulse: 94  Temp: (!) 97.5  F (36.4 C)  TempSrc: Temporal  SpO2: 96%  Weight: 241 lb (109.3 kg)  Height: 5' 8.5" (1.74 m)   96% on RA BMI Readings from Last 3 Encounters:  01/29/21 36.11 kg/m  01/20/21 35.25 kg/m  01/15/21 36.04 kg/m   Wt Readings from Last 3 Encounters:  01/29/21 241 lb (109.3 kg)  01/20/21 238 lb 11.2 oz (108.3 kg)  01/15/21 237 lb (107.5 kg)     CBC    Component Value Date/Time   WBC 8.9 11/11/2020 1217   RBC 4.69 11/11/2020 1217   HGB 15.0 11/11/2020 1217   HCT 43.3 11/11/2020 1217   PLT 253.0 11/11/2020 1217   MCV 92.4 11/11/2020 1217   MCH 31.0 02/08/2017 0939   MCHC 34.6 11/11/2020 1217   RDW 13.4 11/11/2020 1217   LYMPHSABS 2.4 11/11/2020 1217   MONOABS 0.6 11/11/2020 1217   EOSABS 0.1 11/11/2020 1217   BASOSABS 0.1 11/11/2020 1217    Chest Imaging: Lung cancer screening CT completed on 01/21/2021 Has a spiculated appearing 32 mm posterior right lung masslike density concerning for malignancy. The patient's images have been independently reviewed by me.    Pulmonary Functions Testing Results: No flowsheet data found.  FeNO: no  Pathology: no  Echocardiogram: no  Heart Catheterization: no    Assessment & Plan:     ICD-10-CM   1. Lung mass  R91.8 CT Super D Chest Wo Contrast    Ambulatory referral to Pulmonology    Procedural/ Surgical Case Request: VIDEO BRONCHOSCOPY WITH ENDOBRONCHIAL NAVIGATION    CANCELED: Ambulatory referral  to Pulmonology  2. Current smoker  F17.200   3. Abnormal CT lung screening  R91.8     Discussion:  This is a 65 year old gentleman, lung cancer screening CT that was completed in May 2022 which was abnormal.  Has a right lower lobe masslike spiculated density concerning for a malignancy.  Plan: We discussed risk benefits alternatives of proceeding with tissue biopsy. Patient is a current active smoker and not interested in quitting at this time. His wife and him are going to work on this. We discussed smoking cessation today in the office. Would recommend in the meantime the patient undergo nuclear medicine pet imaging for further evaluation of the density. He would like to get undergo biopsy.  To determine what this is. I have discussed risk benefits and alternatives with the patient regarding navigational bronchoscopy. Patient is agreeable to proceed. I have ordered super D CT imaging. Will recommend follow-up with me after tissue biopsy.  Patient likely has COPD.  Undiagnosed, no prior PFTs. Will start triple therapy inhaler regimen with samples of Trelegy.  Return to clinic in approximately 6 weeks  Additional time spent today reviewing images with patient's wife and patient at bedside.   Current Outpatient Medications:  .  ALPRAZolam (XANAX) 1 MG tablet, Take 1 mg by mouth 4 (four) times daily as needed for anxiety., Disp: , Rfl:  .  Empagliflozin-metFORMIN HCl (SYNJARDY) 5-500 MG TABS, Take 1 tablet by mouth 2 (two) times daily., Disp: 180 tablet, Rfl: 0 .  Fluticasone-Umeclidin-Vilant (TRELEGY ELLIPTA) 100-62.5-25 MCG/INH AEPB, Inhale 1 puff into the lungs daily., Disp: 60 each, Rfl: 2 .  Fluticasone-Umeclidin-Vilant (TRELEGY ELLIPTA) 100-62.5-25 MCG/INH AEPB, Inhale 1 puff into the lungs daily., Disp: 28 each, Rfl: 0 .  fluvoxaMINE (LUVOX) 100 MG tablet, Take 200 mg by mouth at bedtime. , Disp: , Rfl:  .  hydrOXYzine (ATARAX/VISTARIL) 25 MG tablet, Take 1 tablet (25 mg  total) by  mouth every 8 (eight) hours as needed for anxiety., Disp: 12 tablet, Rfl: 0 .  icosapent Ethyl (VASCEPA) 1 g capsule, Take 2 capsules (2 g total) by mouth 2 (two) times daily., Disp: 360 capsule, Rfl: 1 .  QUEtiapine (SEROQUEL) 100 MG tablet, Take 300 mg by mouth at bedtime. , Disp: , Rfl:  .  simvastatin (ZOCOR) 20 MG tablet, Take 1 tablet (20 mg total) by mouth at bedtime., Disp: 90 tablet, Rfl: 1 .  benzonatate (TESSALON) 200 MG capsule, Take 1 capsule (200 mg total) by mouth 3 (three) times daily as needed. (Patient not taking: Reported on 01/29/2021), Disp: 60 capsule, Rfl: 0 .  SUMAtriptan (IMITREX) 50 MG tablet, Take 1 tablet (50 mg total) by mouth once for 1 dose. May repeat in 2 hours if headache persists or recurs., Disp: 10 tablet, Rfl: 0  I spent 63 minutes dedicated to the care of this patient on the date of this encounter to include pre-visit review of records, face-to-face time with the patient discussing conditions above, post visit ordering of testing, clinical documentation with the electronic health record, making appropriate referrals as documented, and communicating necessary findings to members of the patients care team.   Garner Nash, [facility] 01/29/2021 6:25 PM

## 2021-01-29 NOTE — Patient Instructions (Signed)
Thank you for visiting Dr. Valeta Harms at [facility]. Today we recommend the following: Orders Placed This Encounter  Procedures  . CT Super D Chest Wo Contrast  . Ambulatory referral to Pulmonology   Trelegy samples + new prescription   Return in about 6 weeks (around 03/12/2021) for with APP or Dr. Valeta Harms.    Please do your part to reduce the spread of COVID-19.

## 2021-02-01 ENCOUNTER — Telehealth: Payer: Self-pay | Admitting: Pulmonary Disease

## 2021-02-01 NOTE — Telephone Encounter (Signed)
Pts wife [name] made aware of the following:  CT sched 5/26 @ 3:15 @ [facility] (Super D Disk being sent to [facility])  PET 6/1 @ 10:00am; 9:30am arrival time. NPO 6 hours prior. No Carbs last meal before.   Covid Test 6/6 @ 2:15pm  [place] 6/7 @ 9:15am; 6:45am arrival time.

## 2021-02-04 ENCOUNTER — Ambulatory Visit (HOSPITAL_COMMUNITY)
Admission: RE | Admit: 2021-02-04 | Discharge: 2021-02-04 | Disposition: A | Discharge status: Home / Self Care | Payer: Medicare Other | Source: Ambulatory Visit | Attending: Pulmonary Disease | Admitting: Pulmonary Disease

## 2021-02-04 ENCOUNTER — Other Ambulatory Visit: Payer: Self-pay

## 2021-02-04 DIAGNOSIS — R918 Other nonspecific abnormal finding of lung field: Secondary | ICD-10-CM

## 2021-02-09 ENCOUNTER — Other Ambulatory Visit (HOSPITAL_COMMUNITY): Payer: Medicare Other

## 2021-02-10 ENCOUNTER — Other Ambulatory Visit: Payer: Self-pay

## 2021-02-10 ENCOUNTER — Encounter (HOSPITAL_COMMUNITY): Payer: Self-pay

## 2021-02-10 ENCOUNTER — Other Ambulatory Visit (HOSPITAL_COMMUNITY): Payer: Medicare Other

## 2021-02-10 ENCOUNTER — Ambulatory Visit (HOSPITAL_COMMUNITY): Admit: 2021-02-10 | Payer: Medicare Other | Admitting: Pulmonary Disease

## 2021-02-10 ENCOUNTER — Ambulatory Visit (HOSPITAL_COMMUNITY)
Admission: RE | Admit: 2021-02-10 | Discharge: 2021-02-10 | Disposition: A | Discharge status: Home / Self Care | Payer: Medicare Other | Source: Ambulatory Visit | Attending: Acute Care | Admitting: Acute Care

## 2021-02-10 DIAGNOSIS — R911 Solitary pulmonary nodule: Secondary | ICD-10-CM | POA: Diagnosis not present

## 2021-02-10 LAB — GLUCOSE, CAPILLARY: Glucose-Capillary: 117 mg/dL — ABNORMAL HIGH (ref 70–99)

## 2021-02-10 SURGERY — VIDEO BRONCHOSCOPY WITH ENDOBRONCHIAL NAVIGATION
Anesthesia: General

## 2021-02-10 MED ORDER — FLUDEOXYGLUCOSE F - 18 (FDG) INJECTION
12.1000 | Freq: Once | INTRAVENOUS | Status: AC
  Administered 2021-02-10: 12.06 via INTRAVENOUS

## 2021-02-12 ENCOUNTER — Other Ambulatory Visit: Payer: Self-pay | Admitting: Internal Medicine

## 2021-02-12 DIAGNOSIS — E118 Type 2 diabetes mellitus with unspecified complications: Secondary | ICD-10-CM

## 2021-02-15 ENCOUNTER — Encounter (HOSPITAL_COMMUNITY): Payer: Self-pay | Admitting: Pulmonary Disease

## 2021-02-15 ENCOUNTER — Other Ambulatory Visit (HOSPITAL_COMMUNITY)
Admission: RE | Admit: 2021-02-15 | Discharge: 2021-02-15 | Disposition: A | Discharge status: Home / Self Care | Payer: Medicare Other | Source: Ambulatory Visit | Attending: Pulmonary Disease | Admitting: Pulmonary Disease

## 2021-02-15 ENCOUNTER — Other Ambulatory Visit: Payer: Self-pay

## 2021-02-15 DIAGNOSIS — Z01812 Encounter for preprocedural laboratory examination: Secondary | ICD-10-CM | POA: Insufficient documentation

## 2021-02-15 DIAGNOSIS — Z20822 Contact with and (suspected) exposure to covid-19: Secondary | ICD-10-CM | POA: Diagnosis not present

## 2021-02-15 NOTE — Progress Notes (Signed)
Spoke with pt for pre-op call. Pt denies cardiac history or HTN. Pt is a type 2 diabetic. Last A1C ws 7.2 on 11/11/20. Pt states his fasting blood sugar is usually between 100-120. Instructed pt not to take his [place] today or tomorrow AM. Instructed pt to check his blood sugar when he gets up and every 2 hours until he leaves for the hospital. If blood sugar is 70 or below, treat with 1/2 cup of clear juice (apple or cranberry) and recheck blood sugar 15 minutes after drinking juice. If blood sugar continues to be 70 or below, call the Short Stay department and ask to speak to a nurse. Pt voiced understanding.   Covid test scheduled for today. Pt understands that he needs to wear a mask when he is out and around others after having the test done.

## 2021-02-16 ENCOUNTER — Encounter (HOSPITAL_COMMUNITY): Payer: Self-pay | Admitting: Pulmonary Disease

## 2021-02-16 ENCOUNTER — Encounter (HOSPITAL_COMMUNITY)
Admission: RE | Disposition: A | Discharge status: Home / Self Care | Payer: Self-pay | Source: Home / Self Care | Attending: Pulmonary Disease

## 2021-02-16 ENCOUNTER — Other Ambulatory Visit: Payer: Self-pay

## 2021-02-16 ENCOUNTER — Ambulatory Visit (HOSPITAL_COMMUNITY): Payer: Medicare Other

## 2021-02-16 ENCOUNTER — Ambulatory Visit (HOSPITAL_COMMUNITY): Payer: Medicare Other | Admitting: Anesthesiology

## 2021-02-16 ENCOUNTER — Ambulatory Visit (HOSPITAL_COMMUNITY)
Admission: RE | Admit: 2021-02-16 | Discharge: 2021-02-16 | Disposition: A | Discharge status: Home / Self Care | Payer: Medicare Other | Attending: Pulmonary Disease | Admitting: Pulmonary Disease

## 2021-02-16 DIAGNOSIS — Z79899 Other long term (current) drug therapy: Secondary | ICD-10-CM | POA: Insufficient documentation

## 2021-02-16 DIAGNOSIS — Z7984 Long term (current) use of oral hypoglycemic drugs: Secondary | ICD-10-CM | POA: Insufficient documentation

## 2021-02-16 DIAGNOSIS — Z7951 Long term (current) use of inhaled steroids: Secondary | ICD-10-CM | POA: Diagnosis not present

## 2021-02-16 DIAGNOSIS — F17210 Nicotine dependence, cigarettes, uncomplicated: Secondary | ICD-10-CM | POA: Insufficient documentation

## 2021-02-16 DIAGNOSIS — R918 Other nonspecific abnormal finding of lung field: Secondary | ICD-10-CM | POA: Diagnosis present

## 2021-02-16 DIAGNOSIS — Z9889 Other specified postprocedural states: Secondary | ICD-10-CM

## 2021-02-16 DIAGNOSIS — R911 Solitary pulmonary nodule: Secondary | ICD-10-CM | POA: Insufficient documentation

## 2021-02-16 DIAGNOSIS — Z419 Encounter for procedure for purposes other than remedying health state, unspecified: Secondary | ICD-10-CM

## 2021-02-16 HISTORY — PX: BRONCHIAL WASHINGS: SHX5105

## 2021-02-16 HISTORY — PX: FIDUCIAL MARKER PLACEMENT: SHX6858

## 2021-02-16 HISTORY — PX: VIDEO BRONCHOSCOPY WITH ENDOBRONCHIAL NAVIGATION: SHX6175

## 2021-02-16 HISTORY — PX: BRONCHIAL NEEDLE ASPIRATION BIOPSY: SHX5106

## 2021-02-16 HISTORY — PX: BRONCHIAL BIOPSY: SHX5109

## 2021-02-16 HISTORY — PX: BRONCHIAL BRUSHINGS: SHX5108

## 2021-02-16 HISTORY — DX: Dyspnea, unspecified: R06.00

## 2021-02-16 LAB — BASIC METABOLIC PANEL
Anion gap: 8 (ref 5–15)
BUN: 15 mg/dL (ref 8–23)
CO2: 24 mmol/L (ref 22–32)
Calcium: 8.5 mg/dL — ABNORMAL LOW (ref 8.9–10.3)
Chloride: 107 mmol/L (ref 98–111)
Creatinine, Ser: 1.07 mg/dL (ref 0.61–1.24)
GFR, Estimated: >60 mL/min (ref >60)
Glucose, Bld: 131 mg/dL — ABNORMAL HIGH (ref 70–99)
Potassium: 4.3 mmol/L (ref 3.5–5.1)
Sodium: 139 mmol/L (ref 135–145)

## 2021-02-16 LAB — GLUCOSE, CAPILLARY
Glucose-Capillary: 133 mg/dL — ABNORMAL HIGH (ref 70–99)
Glucose-Capillary: 140 mg/dL — ABNORMAL HIGH (ref 70–99)

## 2021-02-16 LAB — SARS CORONAVIRUS 2 (TAT 6-24 HRS): SARS Coronavirus 2: NEGATIVE

## 2021-02-16 SURGERY — VIDEO BRONCHOSCOPY WITH ENDOBRONCHIAL NAVIGATION
Anesthesia: General | Laterality: Right

## 2021-02-16 MED ORDER — CHLORHEXIDINE GLUCONATE 0.12 % MT SOLN
15.0000 mL | Freq: Once | OROMUCOSAL | Status: AC
  Filled 2021-02-16: qty 15

## 2021-02-16 MED ORDER — ROCURONIUM BROMIDE 10 MG/ML (PF) SYRINGE
PREFILLED_SYRINGE | INTRAVENOUS | Status: DC | PRN
  Administered 2021-02-16: 70 mg via INTRAVENOUS

## 2021-02-16 MED ORDER — FENTANYL CITRATE (PF) 100 MCG/2ML IJ SOLN
INTRAMUSCULAR | Status: DC | PRN
  Administered 2021-02-16: 100 ug via INTRAVENOUS

## 2021-02-16 MED ORDER — OXYCODONE HCL 5 MG PO TABS: 5.0000 mg | ORAL_TABLET | Freq: Once | ORAL | Status: DC | PRN

## 2021-02-16 MED ORDER — LACTATED RINGERS IV SOLN: INTRAVENOUS | Status: DC

## 2021-02-16 MED ORDER — MIDAZOLAM HCL 5 MG/5ML IJ SOLN
INTRAMUSCULAR | Status: DC | PRN
  Administered 2021-02-16: 2 mg via INTRAVENOUS

## 2021-02-16 MED ORDER — SUGAMMADEX SODIUM 200 MG/2ML IV SOLN
INTRAVENOUS | Status: DC | PRN
  Administered 2021-02-16: 200 mg via INTRAVENOUS
  Administered 2021-02-16: 100 mg via INTRAVENOUS

## 2021-02-16 MED ORDER — AMISULPRIDE (ANTIEMETIC) 5 MG/2ML IV SOLN: 10.0000 mg | Freq: Once | INTRAVENOUS | Status: DC | PRN

## 2021-02-16 MED ORDER — LIDOCAINE 2% (20 MG/ML) 5 ML SYRINGE
INTRAMUSCULAR | Status: DC | PRN
  Administered 2021-02-16: 100 mg via INTRAVENOUS

## 2021-02-16 MED ORDER — PROPOFOL 10 MG/ML IV BOLUS
INTRAVENOUS | Status: DC | PRN
  Administered 2021-02-16: 150 mg via INTRAVENOUS
  Administered 2021-02-16: 50 mg via INTRAVENOUS

## 2021-02-16 MED ORDER — PHENYLEPHRINE HCL-NACL 10-0.9 MG/250ML-% IV SOLN
INTRAVENOUS | Status: DC | PRN
  Administered 2021-02-16: 25 ug/min via INTRAVENOUS

## 2021-02-16 MED ORDER — ONDANSETRON HCL 4 MG/2ML IJ SOLN: 4.0000 mg | Freq: Once | INTRAMUSCULAR | Status: DC | PRN

## 2021-02-16 MED ORDER — PHENYLEPHRINE 40 MCG/ML (10ML) SYRINGE FOR IV PUSH (FOR BLOOD PRESSURE SUPPORT)
PREFILLED_SYRINGE | INTRAVENOUS | Status: DC | PRN
  Administered 2021-02-16: 80 ug via INTRAVENOUS

## 2021-02-16 MED ORDER — CHLORHEXIDINE GLUCONATE 0.12 % MT SOLN
OROMUCOSAL | Status: AC
  Administered 2021-02-16: 15 mL via OROMUCOSAL
  Filled 2021-02-16: qty 15

## 2021-02-16 MED ORDER — OXYCODONE HCL 5 MG/5ML PO SOLN: 5.0000 mg | Freq: Once | ORAL | Status: DC | PRN

## 2021-02-16 MED ORDER — ONDANSETRON HCL 4 MG/2ML IJ SOLN
INTRAMUSCULAR | Status: DC | PRN
  Administered 2021-02-16: 4 mg via INTRAVENOUS

## 2021-02-16 MED ORDER — FENTANYL CITRATE (PF) 100 MCG/2ML IJ SOLN: 25.0000 ug | INTRAMUSCULAR | Status: DC | PRN

## 2021-02-16 MED ORDER — DEXAMETHASONE SODIUM PHOSPHATE 10 MG/ML IJ SOLN
INTRAMUSCULAR | Status: DC | PRN
  Administered 2021-02-16: 5 mg via INTRAVENOUS

## 2021-02-16 MED ORDER — ALBUTEROL SULFATE HFA 108 (90 BASE) MCG/ACT IN AERS
INHALATION_SPRAY | RESPIRATORY_TRACT | Status: DC | PRN
  Administered 2021-02-16: 8 via RESPIRATORY_TRACT

## 2021-02-16 MED ORDER — IPRATROPIUM-ALBUTEROL 0.5-2.5 (3) MG/3ML IN SOLN
RESPIRATORY_TRACT | Status: AC
  Filled 2021-02-16: qty 3

## 2021-02-16 SURGICAL SUPPLY — 47 items

## 2021-02-16 NOTE — Interval H&P Note (Signed)
History and Physical Interval Note:  02/16/2021 8:55 AM  Jeremy Shannon  has presented today for surgery, with the diagnosis of lung nodule.  The various methods of treatment have been discussed with the patient and family. After consideration of risks, benefits and other options for treatment, the patient has consented to  Procedure(s): [place] (Right) as a surgical intervention.  The patient's history has been reviewed, patient examined, no change in status, stable for surgery.  I have reviewed the patient's chart and labs.  Questions were answered to the patient's satisfaction.     [place]

## 2021-02-16 NOTE — Op Note (Signed)
Video Bronchoscopy with Electromagnetic Navigation and fiducial placement procedure Note  Date of Operation: 02/16/2021  Pre-op Diagnosis: Right lower lobe lung nodule  Post-op Diagnosis: Right lower lobe lung nodule  Surgeon: Garner Nash, DO   Assistants: None  Anesthesia: General endotracheal anesthesia  Operation: Flexible video fiberoptic bronchoscopy with electromagnetic navigation and biopsies.  Estimated Blood Loss: Minimal  Complications: None   Indications and History: Jeremy Shannon is a 65 y.o. male with right lower lobe lung nodule.  The risks, benefits, complications, treatment options and expected outcomes were discussed with the patient.  The possibilities of pneumothorax, pneumonia, reaction to medication, pulmonary aspiration, perforation of a viscus, bleeding, failure to diagnose a condition and creating a complication requiring transfusion or operation were discussed with the patient who freely signed the consent.    Description of Procedure: The patient was seen in the Preoperative Area, was examined and was deemed appropriate to proceed.  The patient was taken to [facility] endoscopy room 2, identified as [name] and the procedure verified as Flexible Video Fiberoptic Bronchoscopy.  A Time Out was held and the above information confirmed.   Prior to the date of the procedure a high-resolution CT scan of the chest was performed. Utilizing [place] a virtual tracheobronchial tree was generated to allow the creation of distinct navigation pathways to the patient's parenchymal abnormalities. After being taken to the operating room general anesthesia was initiated and the patient  was orally intubated. The video fiberoptic bronchoscope was introduced via the endotracheal tube and a general inspection was performed which showed normal right and left lung anatomy no evidence of endobronchial lesion.   Target #1 right lower lobe: The extendable working  channel and locator guide were introduced into the bronchoscope. The distinct navigation pathways prepared prior to this procedure were then utilized to navigate to within 0.8 cm of patient's lesion(s) identified on CT scan.  Full fluoroscopic sleep was obtained with inspiratory breath-hold at 20 cm of water, from RAO 30 degrees to LAO 20 degrees. The extendable working channel was secured into place and the locator guide was withdrawn. Under fluoroscopic guidance transbronchial needle brushings, transbronchial Wang needle biopsies, and transbronchial forceps biopsies were performed to be sent for cytology and pathology.  Additional forcep biopsies were taken for tissue culture.  Following tissue sampling under direct fluoroscopy 1 fiducial was placed superior to the lesion at approximately 2 cm from lesional center.  Fiducial was placed under direct fluoroscopic view with the fiducial catheter delivery kit and wire. A bronchioalveolar lavage was performed in the right lower lobe and sent for cytology and microbiology (bacterial, fungal, AFB smears and cultures).  Standard therapeutic scope was inserted into the airway and aspiration of the bilateral mainstem was necessary for removal and remaining blood clots and debris.  All distal subsegments of the airways were patent at the termination of the procedure. At the end of the procedure a general airway inspection was performed and there was no evidence of active bleeding. The bronchoscope was removed.  The patient tolerated the procedure well. There was no significant blood loss and there were no obvious complications. A post-procedural chest x-ray is pending.  Samples: 1. Transbronchial needle brushings from right lower lobe 2. Transbronchial Wang needle biopsies from right lower lobe 3. Transbronchial forceps biopsies from right lower lobe 4. Bronchoalveolar lavage from right lower lobe  Plans:  The patient will be discharged from the PACU to home when  recovered from anesthesia and after chest x-ray  is reviewed. We will review the cytology, pathology and microbiology results with the patient when they become available. Outpatient followup will be with [name] [name],DO.   Garner Nash, DO [facility] 02/16/2021 10:04 AM

## 2021-02-16 NOTE — Anesthesia Postprocedure Evaluation (Signed)
Anesthesia Post Note  Patient: Jeremy Shannon  Procedure(s) Performed: VIDEO BRONCHOSCOPY WITH ENDOBRONCHIAL NAVIGATION (Right ) BRONCHIAL BRUSHINGS BRONCHIAL NEEDLE ASPIRATION BIOPSIES BRONCHIAL BIOPSIES FIDUCIAL MARKER PLACEMENT BRONCHIAL WASHINGS     Patient location during evaluation: PACU Anesthesia Type: General Level of consciousness: awake and alert Pain management: pain level controlled Vital Signs Assessment: post-procedure vital signs reviewed and stable Respiratory status: spontaneous breathing, nonlabored ventilation and respiratory function stable Cardiovascular status: blood pressure returned to baseline and stable Postop Assessment: no apparent nausea or vomiting Anesthetic complications: no   No complications documented.  Last Vitals:  Vitals:   02/16/21 1052 02/16/21 1107  BP: 113/63 113/72  Pulse: 83 81  Resp: 14 12  Temp:  36.6 C  SpO2: 94% 91%    Last Pain:  Vitals:   02/16/21 1107  TempSrc:   PainSc: 0-No pain                 Lidia Collum

## 2021-02-16 NOTE — Anesthesia Preprocedure Evaluation (Signed)
Anesthesia Evaluation  Patient identified by MRN, date of birth, ID band Patient awake    Reviewed: Allergy & Precautions, NPO status , Patient's Chart, lab work & pertinent test results  History of Anesthesia Complications Negative for: history of anesthetic complications  Airway Mallampati: II  TM Distance: >3 FB Neck ROM: Full    Dental  (+) Dental Advisory Given   Pulmonary Current Smoker,  Lung mass   Pulmonary exam normal        Cardiovascular negative cardio ROS Normal cardiovascular exam     Neuro/Psych Anxiety Depression negative neurological ROS     GI/Hepatic negative GI ROS, Neg liver ROS,   Endo/Other  diabetes, Type 2  Renal/GU negative Renal ROS  negative genitourinary   Musculoskeletal negative musculoskeletal ROS (+)   Abdominal   Peds  Hematology negative hematology ROS (+)   Anesthesia Other Findings   Reproductive/Obstetrics                            Anesthesia Physical Anesthesia Plan  ASA: II  Anesthesia Plan: General   Post-op Pain Management:    Induction: Intravenous  PONV Risk Score and Plan: 1 and Ondansetron, Dexamethasone, Treatment may vary due to age or medical condition and Midazolam  Airway Management Planned: Oral ETT  Additional Equipment: None  Intra-op Plan:   Post-operative Plan: Extubation in OR  Informed Consent: I have reviewed the patients History and Physical, chart, labs and discussed the procedure including the risks, benefits and alternatives for the proposed anesthesia with the patient or authorized representative who has indicated his/her understanding and acceptance.     Dental advisory given  Plan Discussed with:   Anesthesia Plan Comments:         Anesthesia Quick Evaluation

## 2021-02-16 NOTE — Progress Notes (Signed)
I reviewed with patient today on day of bronchoscopy   Thanks,  BLI  Garner Nash, DO [facility] 02/16/2021 2:33 PM

## 2021-02-16 NOTE — Anesthesia Procedure Notes (Signed)
Procedure Name: Intubation Date/Time: 02/16/2021 9:23 AM Performed by: Harden Mo, CRNA Pre-anesthesia Checklist: Patient identified, Emergency Drugs available, Suction available and Patient being monitored Patient Re-evaluated:Patient Re-evaluated prior to induction Oxygen Delivery Method: Circle System Utilized Preoxygenation: Pre-oxygenation with 100% oxygen Induction Type: IV induction Ventilation: Mask ventilation without difficulty and Oral airway inserted - appropriate to patient size Laryngoscope Size: Sabra Heck and 2 Grade View: Grade I Tube type: Oral Tube size: 8.5 mm Number of attempts: 1 Airway Equipment and Method: Stylet and Oral airway Placement Confirmation: ETT inserted through vocal cords under direct vision,  positive ETCO2 and breath sounds checked- equal and bilateral Secured at: 23 cm Tube secured with: Tape Dental Injury: Teeth and Oropharynx as per pre-operative assessment

## 2021-02-16 NOTE — Discharge Instructions (Signed)
Flexible Bronchoscopy, Care After This sheet gives you information about how to care for yourself after your test. Your doctor may also give you more specific instructions. If you have problems or questions, contact your doctor. Follow these instructions at home: Eating and drinking  Do not eat or drink anything (not even water) for 2 hours after your test, or until your numbing medicine (local anesthetic) wears off.  When your numbness is gone and your cough and gag reflexes have come back, you may: ? Eat only soft foods. ? Slowly drink liquids.  The day after the test, go back to your normal diet. Driving  Do not drive for 24 hours if you were given a medicine to help you relax (sedative).  Do not drive or use heavy machinery while taking prescription pain medicine. General instructions   Take over-the-counter and prescription medicines only as told by your doctor.  Return to your normal activities as told. Ask what activities are safe for you.  Do not use any products that have nicotine or tobacco in them. This includes cigarettes and e-cigarettes. If you need help quitting, ask your doctor.  Keep all follow-up visits as told by your doctor. This is important. It is very important if you had a tissue sample (biopsy) taken. Get help right away if:  You have shortness of breath that gets worse.  You get light-headed.  You feel like you are going to pass out (faint).  You have chest pain.  You cough up: ? More than a little blood. ? More blood than before. Summary  Do not eat or drink anything (not even water) for 2 hours after your test, or until your numbing medicine wears off.  Do not use cigarettes. Do not use e-cigarettes.  Get help right away if you have chest pain.   This information is not intended to replace advice given to you by your health care provider. Make sure you discuss any questions you have with your health care provider. Document Released:  06/26/2009 Document Revised: 08/11/2017 Document Reviewed: 09/16/2016 Elsevier Patient Education  2020 [employer].

## 2021-02-16 NOTE — Transfer of Care (Signed)
Immediate Anesthesia Transfer of Care Note  Patient: Jeremy Shannon  Procedure(s) Performed: VIDEO BRONCHOSCOPY WITH ENDOBRONCHIAL NAVIGATION (Right ) BRONCHIAL BRUSHINGS BRONCHIAL NEEDLE ASPIRATION BIOPSIES BRONCHIAL BIOPSIES FIDUCIAL MARKER PLACEMENT BRONCHIAL WASHINGS  Patient Location: PACU  Anesthesia Type:General  Level of Consciousness: awake and alert   Airway & Oxygen Therapy: Patient Spontanous Breathing and Patient connected to face mask oxygen  Post-op Assessment: Report given to RN, Post -op Vital signs reviewed and stable and Patient moving all extremities X 4  Post vital signs: Reviewed and stable  Last Vitals:  Vitals Value Taken Time  BP 109/69 02/16/21 1037  Temp    Pulse 94 02/16/21 1037  Resp    SpO2 91 % 02/16/21 1037  Vitals shown include unvalidated device data.  Last Pain:  Vitals:   02/16/21 0746  TempSrc:   PainSc: 0-No pain      Patients Stated Pain Goal: 5 (86/77/37 3668)  Complications: No complications documented.

## 2021-02-17 ENCOUNTER — Telehealth: Payer: Self-pay | Admitting: Internal Medicine

## 2021-02-17 ENCOUNTER — Encounter: Payer: Self-pay | Admitting: Pulmonary Disease

## 2021-02-17 ENCOUNTER — Other Ambulatory Visit: Payer: Self-pay | Admitting: Internal Medicine

## 2021-02-17 ENCOUNTER — Ambulatory Visit (INDEPENDENT_AMBULATORY_CARE_PROVIDER_SITE_OTHER): Payer: Medicare Other | Admitting: Pulmonary Disease

## 2021-02-17 ENCOUNTER — Ambulatory Visit: Payer: Medicare Other | Admitting: Pulmonary Disease

## 2021-02-17 VITALS — BP 112/68 | HR 88 | Temp 98.5°F | Ht 68.5 in | Wt 242.0 lb

## 2021-02-17 DIAGNOSIS — J410 Simple chronic bronchitis: Secondary | ICD-10-CM

## 2021-02-17 DIAGNOSIS — F17200 Nicotine dependence, unspecified, uncomplicated: Secondary | ICD-10-CM

## 2021-02-17 DIAGNOSIS — R0602 Shortness of breath: Secondary | ICD-10-CM | POA: Diagnosis not present

## 2021-02-17 DIAGNOSIS — R918 Other nonspecific abnormal finding of lung field: Secondary | ICD-10-CM

## 2021-02-17 LAB — PULMONARY FUNCTION TEST
DL/VA % pred: 82 %
DL/VA: 3.39 ml/min/mmHg/L
DLCO cor % pred: 67 %
DLCO cor: 16.92 ml/min/mmHg
DLCO unc % pred: 67 %
DLCO unc: 16.92 ml/min/mmHg
FEF 25-75 Post: 1.74 L/sec
FEF 25-75 Pre: 2.13 L/sec
FEF2575-%Change-Post: -18 %
FEF2575-%Pred-Post: 70 %
FEF2575-%Pred-Pre: 85 %
FEV1-%Change-Post: -4 %
FEV1-%Pred-Post: 80 %
FEV1-%Pred-Pre: 84 %
FEV1-Post: 2.55 L
FEV1-Pre: 2.67 L
FEV1FVC-%Change-Post: 2 %
FEV1FVC-%Pred-Pre: 101 %
FEV6-%Change-Post: -6 %
FEV6-%Pred-Post: 80 %
FEV6-%Pred-Pre: 86 %
FEV6-Post: 3.28 L
FEV6-Pre: 3.49 L
FEV6FVC-%Change-Post: 0 %
FEV6FVC-%Pred-Post: 105 %
FEV6FVC-%Pred-Pre: 104 %
FVC-%Change-Post: -7 %
FVC-%Pred-Post: 76 %
FVC-%Pred-Pre: 82 %
FVC-Post: 3.28 L
FVC-Pre: 3.53 L
Post FEV1/FVC ratio: 78 %
Post FEV6/FVC ratio: 100 %
Pre FEV1/FVC ratio: 76 %
Pre FEV6/FVC Ratio: 99 %
RV % pred: 75 %
RV: 1.73 L
TLC % pred: 79 %
TLC: 5.31 L

## 2021-02-17 LAB — CYTOLOGY - NON PAP

## 2021-02-17 NOTE — Telephone Encounter (Signed)
Per last OV note I seen you only wanted pt to maintain synjardy. Is that still correct?

## 2021-02-17 NOTE — Telephone Encounter (Signed)
Dr. Ander Slade, please see comment regarding handicapped packard for patient and advise.  Thank you.

## 2021-02-17 NOTE — Patient Instructions (Signed)
  We will be in contact with you once the results from a bronchoscopy comes back -Usually takes about 72 hours  Continue to work on quitting smoking  Tentative follow-up in 3 months  If the bronchoscopy is negative, usual routine is to follow-up with a CT scan in 3 to 6 months

## 2021-02-17 NOTE — Telephone Encounter (Signed)
Pharmacy has been informed.

## 2021-02-17 NOTE — Patient Instructions (Signed)
Full PFT performed today. °

## 2021-02-17 NOTE — Progress Notes (Signed)
Jeremy Shannon    [id number]    [birth year]  Primary Care Physician:Jones, Arvid Right, MD  Referring Physician: [name], MD [address] [place],  [place] [address]  Chief complaint:   Follow-up for abnormal CT scan of the chest  HPI:  Pack-a-day smoker for over 40 years  Had a bronchoscopy performed 02/16/2021 for right lower lobe infiltrative process -Process was discovered on his lung cancer screening study  He has a dry cough No weight loss  History of snoring States he goes to bed about 11 PM final up time of 6 to 6:30 AM Will usually wake up about 230 and then a couple more times before getting up in the morning Weight is up about 20 pounds  His initial evaluation was for possible obstructive sleep apnea however, denies any significant symptoms He does have a history of anxiety/depression  No contributory occupational history  Outpatient Encounter Medications as of 02/17/2021  Medication Sig  . ALPRAZolam (XANAX) 1 MG tablet Take 1 mg by mouth 4 (four) times daily as needed for anxiety.  . empagliflozin (JARDIANCE) 10 MG TABS tablet Take 10 mg by mouth daily.  . Fluticasone-Umeclidin-Vilant (TRELEGY ELLIPTA) 100-62.5-25 MCG/INH AEPB Inhale 1 puff into the lungs daily.  . fluvoxaMINE (LUVOX) 100 MG tablet Take 100 mg by mouth 3 (three) times daily.  . QUEtiapine (SEROQUEL) 100 MG tablet Take 300 mg by mouth at bedtime.   Marland Kitchen REXULTI 2 MG TABS tablet Take 2 mg by mouth daily.  . simvastatin (ZOCOR) 20 MG tablet Take 1 tablet (20 mg total) by mouth at bedtime.  Marland Kitchen SYNJARDY 5-500 MG TABS TAKE 1 TABLET BY MOUTH TWICE DAILY  . icosapent Ethyl (VASCEPA) 1 g capsule Take 2 capsules (2 g total) by mouth 2 (two) times daily. (Patient not taking: Reported on 02/17/2021)   No facility-administered encounter medications on file as of 02/17/2021.    Allergies as of 02/17/2021  . (No Known Allergies)    Past Medical History:  Diagnosis Date  . Anemia    pt  denies  . Anxiety   . Cataract   . Depression   . Diabetes mellitus without complication ([place])    type 2  . Dyspnea   . Hyperlipidemia   . PONV (postoperative nausea and vomiting)     Past Surgical History:  Procedure Laterality Date  . CATARACT EXTRACTION     bilateral  . CHOLECYSTECTOMY N/A 02/10/2017   Procedure: LAPAROSCOPIC CHOLECYSTECTOMY WITH INTRAOPERATIVE CHOLANGIOGRAM;  Surgeon: Alphonsa Overall, MD;  Location: [facility];  Service: General;  Laterality: N/A;  . COLONOSCOPY      Family History  Problem Relation Age of Onset  . Liver cancer Mother   . Depression Father   . Drug abuse Father     Social History   Socioeconomic History  . Marital status: Married    Spouse name: Not on file  . Number of children: Not on file  . Years of education: Not on file  . Highest education level: Not on file  Occupational History  . Not on file  Tobacco Use  . Smoking status: Current Every Day Smoker    Packs/day: 1.50    Years: 43.00    Pack years: 64.50    Types: Cigarettes  . Smokeless tobacco: Never Used  . Tobacco comment: 10-20 cigarettes smoked daily 02/17/2021  Substance and Sexual Activity  . Alcohol use: No  . Drug use: No  .  Sexual activity: Yes    Partners: Female    Birth control/protection: None  Other Topics Concern  . Not on file  Social History Narrative  . Not on file   Social Determinants of Health   Financial Resource Strain: Not on file  Food Insecurity: Not on file  Transportation Needs: Not on file  Physical Activity: Not on file  Stress: Not on file  Social Connections: Not on file  Intimate Partner Violence: Not on file    Review of Systems  Respiratory: Positive for cough.   Psychiatric/Behavioral: Positive for sleep disturbance.    Vitals:   02/17/21 1203  BP: 112/68  Pulse: 88  Temp: 98.5 F (36.9 C)  SpO2: 93%     Physical Exam Constitutional:      Appearance: He is obese.  HENT:     Head: Normocephalic and atraumatic.      Right Ear: Tympanic membrane normal.     Nose: Nose normal.     Mouth/Throat:     Comments: Mallampati 4, crowded oropharynx Eyes:     General:        Right eye: No discharge.        Left eye: No discharge.  Cardiovascular:     Rate and Rhythm: Normal rate and regular rhythm.     Heart sounds: No murmur heard. No friction rub.  Pulmonary:     Effort: No respiratory distress.     Breath sounds: No stridor. No wheezing or rhonchi.  Musculoskeletal:     Cervical back: No rigidity or tenderness.  Neurological:     Mental Status: He is alert.  Psychiatric:        Mood and Affect: Mood normal.   No flowsheet data found.  Epworth Sleepiness Scale of 0 today  Data Reviewed: CT scan of the chest was reviewed with the patient  Bronchoscopy results pending-bronchoscopy was performed 02/16/2021  Pulmonary function test today with no obstruction, no significant bronchodilator response, reduction in diffusing capacity may be related to recent procedure  Assessment:  Cough  Right lower lobe nodular infiltrative process  Snoring -Denies significant symptoms concerning for sleep disordered breathing  Active smoker  Over 40-pack-year smoking history  Plan/Recommendations:  Follow-up on bronchoscopy biopsy results  Bronchodilators  Smoking cessation counseling  Encouraged to call with any significant concerns  Will update with results    Sherrilyn Rist MD [facility] 02/17/2021, 12:08 PM  CC: [name], MD

## 2021-02-17 NOTE — Progress Notes (Signed)
Full PFT performed today. °

## 2021-02-17 NOTE — Telephone Encounter (Signed)
   Pharmacy calling to verify medications. Should patient be taking   SYNJARDY 5-500 MG TABS and empagliflozin (JARDIANCE) 10 MG TABS tablet

## 2021-02-17 NOTE — Telephone Encounter (Signed)
Yes, that is ccorrect

## 2021-02-18 ENCOUNTER — Other Ambulatory Visit (HOSPITAL_COMMUNITY): Payer: Medicare Other

## 2021-02-18 ENCOUNTER — Encounter (HOSPITAL_COMMUNITY): Payer: Self-pay | Admitting: Pulmonary Disease

## 2021-02-18 ENCOUNTER — Telehealth: Payer: Self-pay | Admitting: Acute Care

## 2021-02-18 DIAGNOSIS — Z87891 Personal history of nicotine dependence: Secondary | ICD-10-CM

## 2021-02-18 DIAGNOSIS — F17200 Nicotine dependence, unspecified, uncomplicated: Secondary | ICD-10-CM

## 2021-02-18 LAB — CULTURE, BAL-QUANTITATIVE W GRAM STAIN
Culture: NO GROWTH
Gram Stain: NONE SEEN

## 2021-02-18 NOTE — Progress Notes (Signed)
I called the patient with the results of his PET scan. There was no answer. I have left a HIPPA compliant message on his VM, with our contact information. I have asked him to call the office for the results. PET was not hypermetabolic, and bronch sampling shows no malignant cells. Langley Gauss, he will need a 6 month follow up CT Chest. Let's wait to place the order until we speak with him. Thanks so much

## 2021-02-18 NOTE — Telephone Encounter (Signed)
Spoke with patient, he states that Judson Roch called him this morning regarding his bronch and PET scan results.  Advised that Judson Roch is not available this afternoon and that Dr. Valeta Harms is in the office seeing patients.  Advised that I would send a message to both of them and they would get the results to him as soon as possible.  Dr. Valeta Harms and Judson Roch, Patient returning call regarding PET scan results and Bronch results.

## 2021-02-19 NOTE — Telephone Encounter (Signed)
Order placed for 6 mth f/u Low dose CT. Pt will be called closer to that time to schedule CT.

## 2021-02-19 NOTE — Telephone Encounter (Signed)
Pt returning a phone call regarding his pet scan results. Pt can be reached at [phone].

## 2021-02-19 NOTE — Telephone Encounter (Signed)
Thanks so much. 

## 2021-02-19 NOTE — Telephone Encounter (Signed)
I called and spoke with patient regarding PET and bronch results from [place] phone message yesterday with results. Patient called back wanting results. I informed patient of results and sarah recs and that denise would be calling to schedule a CT for 6 months. I informed patient I would let Judson Roch, denise and Dr. Valeta Harms know that I spoke with him and to call with anymore questions/concerns. Patient verbalized understanding, nothing further needed.  Dr. Lindell Spar and [place], just [place]. Thanks!

## 2021-02-21 LAB — AEROBIC/ANAEROBIC CULTURE W GRAM STAIN (SURGICAL/DEEP WOUND)
Culture: NO GROWTH
Culture: NO GROWTH
Gram Stain: NONE SEEN
Gram Stain: NONE SEEN

## 2021-02-21 LAB — ACID FAST SMEAR (AFB, MYCOBACTERIA)
Acid Fast Smear: NEGATIVE
Acid Fast Smear: NEGATIVE

## 2021-02-22 ENCOUNTER — Telehealth: Payer: Self-pay | Admitting: Acute Care

## 2021-02-22 NOTE — Telephone Encounter (Signed)
[name] spoke with him. I tried calling him again from the hospital Friday, but he did not answer. The 6 month follow up was ordered. I can try again today , or if you would like to talk with him, just let me know. Thanks

## 2021-02-22 NOTE — Telephone Encounter (Signed)
Okay to have form ready and I can fill/sign when in office on 16th

## 2021-02-22 NOTE — Telephone Encounter (Signed)
I called the patient and was able to follow up to make sure he had no additional questions . My nurse [name] gave the patient his results Friday 02/19/2021. His PET was negative for hypermetabolic activity, and his biopsy results were negative for malignant cells. He understood that we would do a 6 month follow up CT Chest. His question was regarding what would happen with the nodule we are concerned about. I explained that the reason we are doing the 6 month follow up is to monitor the nodule. Surveillance is an important part of follow up. He verbalized understanding. I told him he would receive a call from the office to schedule the scan closer to the time. He verbalized understanding and had no further questions at completion of the call.

## 2021-02-22 NOTE — Progress Notes (Signed)
See telephone note 02/22/2021

## 2021-02-25 ENCOUNTER — Telehealth: Payer: Self-pay | Admitting: Pulmonary Disease

## 2021-02-25 NOTE — Telephone Encounter (Signed)
I called and spoke with patient wife [name], who is on the DPR, that the handicap form was signed and I will leave upfront at front desk for whenever they are ready to get it. [name] verbalized understanding, nothing further needed.

## 2021-03-03 ENCOUNTER — Other Ambulatory Visit: Payer: Self-pay

## 2021-03-03 ENCOUNTER — Ambulatory Visit: Payer: Medicare Other | Admitting: Neurology

## 2021-03-03 ENCOUNTER — Encounter: Payer: Self-pay | Admitting: Neurology

## 2021-03-03 VITALS — Ht 69.0 in | Wt 237.0 lb

## 2021-03-03 DIAGNOSIS — R270 Ataxia, unspecified: Secondary | ICD-10-CM | POA: Diagnosis not present

## 2021-03-03 DIAGNOSIS — R292 Abnormal reflex: Secondary | ICD-10-CM

## 2021-03-03 DIAGNOSIS — R269 Unspecified abnormalities of gait and mobility: Secondary | ICD-10-CM

## 2021-03-03 DIAGNOSIS — G20C Parkinsonism, unspecified: Secondary | ICD-10-CM

## 2021-03-03 DIAGNOSIS — W19XXXA Unspecified fall, initial encounter: Secondary | ICD-10-CM

## 2021-03-03 DIAGNOSIS — G20 Parkinson's disease: Secondary | ICD-10-CM | POA: Diagnosis not present

## 2021-03-03 NOTE — Patient Instructions (Addendum)
MRI of the brain and cervical spine DAT Scan (This is a test to help diagnose for Parkinsonian disease vs other causes of parkinsonism)  Fall Prevention in the Home, Adult Falls can cause injuries and can happen to people of all ages. There are many things you can do to make your home safe and to help prevent falls. Ask forhelp when making these changes. What actions can I take to prevent falls? General Instructions Use good lighting in all rooms. Replace any light bulbs that burn out. Turn on the lights in dark areas. Use night-lights. Keep items that you use often in easy-to-reach places. Lower the shelves around your home if needed. Set up your furniture so you have a clear path. Avoid moving your furniture around. Do not have throw rugs or other things on the floor that can make you trip. Avoid walking on wet floors. If any of your floors are uneven, fix them. Add color or contrast paint or tape to clearly mark and help you see: Grab bars or handrails. First and last steps of staircases. Where the edge of each step is. If you use a stepladder: Make sure that it is fully opened. Do not climb a closed stepladder. Make sure the sides of the stepladder are locked in place. Ask someone to hold the stepladder while you use it. Know where your pets are when moving through your home. What can I do in the bathroom?     Keep the floor dry. Clean up any water on the floor right away. Remove soap buildup in the tub or shower. Use nonskid mats or decals on the floor of the tub or shower. Attach bath mats securely with double-sided, nonslip rug tape. If you need to sit down in the shower, use a plastic, nonslip stool. Install grab bars by the toilet and in the tub and shower. Do not use towel bars as grab bars. What can I do in the bedroom? Make sure that you have a light by your bed that is easy to reach. Do not use any sheets or blankets for your bed that hang to the floor. Have a firm  chair with side arms that you can use for support when you get dressed. What can I do in the kitchen? Clean up any spills right away. If you need to reach something above you, use a step stool with a grab bar. Keep electrical cords out of the way. Do not use floor polish or wax that makes floors slippery. What can I do with my stairs? Do not leave any items on the stairs. Make sure that you have a light switch at the top and the bottom of the stairs. Make sure that there are handrails on both sides of the stairs. Fix handrails that are broken or loose. Install nonslip stair treads on all your stairs. Avoid having throw rugs at the top or bottom of the stairs. Choose a carpet that does not hide the edge of the steps on the stairs. Check carpeting to make sure that it is firmly attached to the stairs. Fix carpet that is loose or worn. What can I do on the outside of my home? Use bright outdoor lighting. Fix the edges of walkways and driveways and fix any cracks. Remove anything that might make you trip as you walk through a door, such as a raised step or threshold. Trim any bushes or trees on paths to your home. Check to see if handrails are loose or broken  and that both sides of all steps have handrails. Install guardrails along the edges of any raised decks and porches. Clear paths of anything that can make you trip, such as tools or rocks. Have leaves, snow, or ice cleared regularly. Use sand or salt on paths during winter. Clean up any spills in your garage right away. This includes grease or oil spills. What other actions can I take? Wear shoes that: Have a low heel. Do not wear high heels. Have rubber bottoms. Feel good on your feet and fit well. Are closed at the toe. Do not wear open-toe sandals. Use tools that help you move around if needed. These include: Canes. Walkers. Scooters. Crutches. Review your medicines with your doctor. Some medicines can make you feel dizzy.  This can increase your chance of falling. Ask your doctor what else you can do to help prevent falls. Where to find more information Centers for Disease Control and Prevention, [employer]: http://www.wolf.info/ National Institute on Aging: http://kim-miller.com/ Contact a doctor if: You are afraid of falling at home. You feel weak, drowsy, or dizzy at home. You fall at home. Summary There are many simple things that you can do to make your home safe and to help prevent falls. Ways to make your home safe include removing things that can make you trip and installing grab bars in the bathroom. Ask for help when making these changes in your home. This information is not intended to replace advice given to you by your health care provider. Make sure you discuss any questions you have with your healthcare provider. Document Revised: 04/01/2020 Document Reviewed: 04/01/2020 Elsevier Patient Education  [employer].

## 2021-03-03 NOTE — Progress Notes (Addendum)
[facility]    Provider:  Dr Jaynee Eagles Requesting Provider: [name], MD Primary Care Provider:  [name], MD  CC: gait abnormality  HPI:  Jeremy Shannon is a 65 y.o. male here as requested by [name], MD for neuropathy. PMHx chronic bronchitis with smoker's cough, type 2 diabetes, neuropathy, anxiety, depression, hyperlipidemia, lung mass. Here with his wife who also provides much information and says He has numbness in the feet but this is not the important issue, it is his gait. Up to the arch. Not painful. No imbalance. His wife also provides much information. He is having difficulty walking and he sits a lot. He has some problems going up hills, he has low backs, no tremors, no weakness. No pain, the numbness is just there. He is doing everything slower, even on a bike, he gets more fatigued, he has put on more weight but he will sit all day, no energy. He is very disengaged. His cousin has parkinson;s disease. He snores heavily and is very tired during the day, really bad headache once a year. No mmotivation. He says he is depressed and he takes medication. There is "a lot goin on". Shuffling a lot, slower. No changes in handwriting. Voice is a little softer. He is still smoking, no drooling, no vivid dreams. He has had several falls after losing his balance.   Reviewed notes, labs and imaging from outside physicians, which showed:  I reviewed Dr. Ronnald Ramp notes from March 2022, patient was seen with his wife, noted a gradual decline over the last year, complaining of chronic low back pain, diffuse weakness, fatigue, shortness of breath, wheezing, frequent falls and dragging his feet.  An extensive history of tobacco abuse, cough, shortness of breath.  Review of systems he did state weakness and numbness in the bottom of both feet.  Exam showed that he was obese and ill-appearing however cardiovascular, pulmonary, musculoskeletal exams were normal and  neurologic exam was nonfocal.  Also noted was inattentiveness, flat affect, delayed and tangential speech.  In reviewing epic, there is extensive history, most notably recently of lung mass found in the right lower lobe, biopsy was taken for cytology, pathology and microbiology.  His PET scan was negative for hypermetabolic activity and the biopsy results were negative for malignant cells, he will continue to follow for this.  February 16, 2021: BMP with BUN 15 and creatinine 1.07, November 11, 2020: B12 440, TSH normal, vitamin B1 within normal limits, hemoglobin A1c 7.2, CBC normal,  Feb 08, 2017: Hemoglobin A1c 6  CT head 11/25/2005: reviewed report  Review of Systems: Patient complains of symptoms per HPI as well as the following symptoms falls. Pertinent negatives and positives per HPI. All others negative.   Social History   Socioeconomic History   Marital status: Married    Spouse name: Not on file   Number of children: Not on file   Years of education: Not on file   Highest education level: Not on file  Occupational History   Not on file  Tobacco Use   Smoking status: Every Day    Packs/day: 1.50    Years: 43.00    Pack years: 64.50    Types: Cigarettes   Smokeless tobacco: Never   Tobacco comments:    10-20 cigarettes smoked daily 02/17/2021  Substance and Sexual Activity   Alcohol use: No   Drug use: No   Sexual activity: Yes    Partners: Female  Birth control/protection: None  Other Topics Concern   Not on file  Social History Narrative   Not on file   Social Determinants of Health   Financial Resource Strain: Not on file  Food Insecurity: Not on file  Transportation Needs: Not on file  Physical Activity: Not on file  Stress: Not on file  Social Connections: Not on file  Intimate Partner Violence: Not on file    Family History  Problem Relation Age of Onset   Liver cancer Mother    Depression Father    Drug abuse Father     Past Medical History:   Diagnosis Date   Anemia    pt denies   Anxiety    Cataract    Depression    Diabetes mellitus without complication ([place])    type 2   Dyspnea    Hyperlipidemia    PONV (postoperative nausea and vomiting)     Patient Active Problem List   Diagnosis Date Noted   Lung mass 01/29/2021   Need for DTP vaccine 11/15/2020   Pure hyperglyceridemia 11/12/2020   Neuropathy 11/11/2020   Type II diabetes mellitus with complication ([place]) 83/41/9622   Deficiency anemia 11/11/2020   Hyperlipidemia with target LDL less than 100 11/11/2020   Encounter for general adult medical examination with abnormal findings 11/11/2020   Loud snoring 11/11/2020   Cough 11/11/2020   Simple chronic bronchitis ([place]) 11/11/2020   DOE (dyspnea on exertion) 11/11/2020   ANXIETY 12/17/2007   Smokers' cough ([place]) 12/17/2007   DEPRESSION 12/17/2007   COLONIC POLYPS 05/07/2007    Past Surgical History:  Procedure Laterality Date   BRONCHIAL BIOPSY  02/16/2021   Procedure: BRONCHIAL BIOPSIES;  Surgeon: Garner Nash, DO;  Location: [facility];  Service: Pulmonary;;   BRONCHIAL BRUSHINGS  02/16/2021   Procedure: BRONCHIAL BRUSHINGS;  Surgeon: Garner Nash, DO;  Location: [facility];  Service: Pulmonary;;   BRONCHIAL NEEDLE ASPIRATION BIOPSY  02/16/2021   Procedure: BRONCHIAL NEEDLE ASPIRATION BIOPSIES;  Surgeon: Garner Nash, DO;  Location: [facility];  Service: Pulmonary;;   BRONCHIAL WASHINGS  02/16/2021   Procedure: BRONCHIAL WASHINGS;  Surgeon: Garner Nash, DO;  Location: [facility];  Service: Pulmonary;;   CATARACT EXTRACTION     bilateral   CHOLECYSTECTOMY N/A 02/10/2017   Procedure: LAPAROSCOPIC CHOLECYSTECTOMY WITH INTRAOPERATIVE CHOLANGIOGRAM;  Surgeon: Alphonsa Overall, MD;  Location: [facility];  Service: General;  Laterality: N/A;   COLONOSCOPY     FIDUCIAL MARKER PLACEMENT  02/16/2021   Procedure: FIDUCIAL MARKER PLACEMENT;  Surgeon: Garner Nash, DO;  Location: [facility];  Service:  Pulmonary;;   VIDEO BRONCHOSCOPY WITH ENDOBRONCHIAL NAVIGATION Right 02/16/2021   Procedure: VIDEO BRONCHOSCOPY WITH ENDOBRONCHIAL NAVIGATION;  Surgeon: Garner Nash, DO;  Location: [facility];  Service: Pulmonary;  Laterality: Right;    Current Outpatient Medications  Medication Sig Dispense Refill   ALPRAZolam (XANAX) 1 MG tablet Take 1 mg by mouth 4 (four) times daily as needed for anxiety.     Fluticasone-Umeclidin-Vilant (TRELEGY ELLIPTA) 100-62.5-25 MCG/INH AEPB Inhale 1 puff into the lungs daily. 60 each 2   fluvoxaMINE (LUVOX) 100 MG tablet Take 100 mg by mouth 3 (three) times daily.     icosapent Ethyl (VASCEPA) 1 g capsule Take 2 capsules (2 g total) by mouth 2 (two) times daily. 360 capsule 1   JARDIANCE 10 MG TABS tablet Take 10 mg by mouth daily.     QUEtiapine (SEROQUEL) 100 MG tablet Take 300 mg by mouth  at bedtime.      REXULTI 2 MG TABS tablet Take 2 mg by mouth daily.     simvastatin (ZOCOR) 20 MG tablet Take 1 tablet (20 mg total) by mouth at bedtime. 90 tablet 1   SYNJARDY 5-500 MG TABS TAKE 1 TABLET BY MOUTH TWICE DAILY 180 tablet 0   Vitamin D, Ergocalciferol, (DRISDOL) 1.25 MG (50000 UNIT) CAPS capsule Take 1 capsule by mouth once a week.     No current facility-administered medications for this visit.    Allergies as of 03/03/2021   (No Known Allergies)    Vitals: Ht 5\' 9"  (1.753 m)   Wt 237 lb (107.5 kg)   BMI 35.00 kg/m  Last Weight:  Wt Readings from Last 1 Encounters:  03/03/21 237 lb (107.5 kg)   Last Height:   Ht Readings from Last 1 Encounters:  03/03/21 5\' 9"  (1.753 m)     Physical exam: Exam: Gen: NAD, conversant, well nourised, obese, well groomed                     CV: RRR, no MRG. No Carotid Bruits. No peripheral edema, warm, nontender Eyes: Conjunctivae clear without exudates or hemorrhage  Neuro: Detailed Neurologic Exam  Speech:    Speech is normal; fluent and spontaneous with normal comprehension.  Cognition:    The  patient is oriented to person, place, and time;     recent and remote memory intact;     language fluent;     normal attention, concentration,     fund of knowledge Cranial Nerves: Flat affect/hypomimia.  The pupils are equal, round, and reactive to light. Pupils too small to visualize fundi.  Visual fields are full to finger confrontation. Extraocular movements are intact. Trigeminal sensation is intact and the muscles of mastication are normal. The face is symmetric. The palate elevates in the midline. Hearing intact. Voice is normal. Shoulder shrug is normal. The tongue has normal motion without fasciculations.   Coordination:    Normal finger to nose   Gait:    Decreased arm swing, narrow gait, decreased arm swing  Motor Observation:    No asymmetry, no atrophy, and no involuntary movements noted. Tone:    Normal muscle tone.    Posture:    Posture is normal.     Strength:    Strength is V/V in the upper and lower limbs.      Sensation: decreased pin prick to ankles, 3 secs vibration at great toe, intact proprioception of the great toe.      Reflex Exam:  DTR's:    Deep tendon reflexes in the upper and lower extremities are brisk for age bilaterally.   Toes:    The toes are downgoing bilaterally.   Clonus:    Clonus is absent.    Assessment/Plan:  65 y.o. male here as requested by [name], MD for neuropathy. PMHx chronic bronchitis with smoker's cough, type 2 diabetes, neuropathy, anxiety, depression, hyperlipidemia, lung mass. Here with his wife who also provides much information and says He has numbness in the feet but this is not the important issue, it is his gait.   Patient has some mild parkinsonian features and abnormal gait and on examination and reports ataxia and falls. Need MRI of the brain to look for reversible causes such as microvascular ischemia, NPH, strokes, focal atrophy or other and mri cervical spine to evaluate for spinal cord involvement such  as myelopathy (abnormal reflexes for age  and medical conditions with ataxia and falls).  May also consider DAT scan if no etiology found on MRIs, could be the beginning of a parkinsonian disorder. He has also been on anti-dopaminergic drugs, can use DAT scan to help differentiate etiogy.  Will see when testing is complete and then set up follow up  Obesity, fatigue: I encouraged a sleep eval/study but he declined  (09/09/2021: I spoke with wife. He is still bradykinetic and parkinsonian. We got his DAT Scan approved but he never had it completed. Will refer to physical therapy and get second opinion at [facility] movement disorder per patient request she is not sure she wants the DAT but I do think he his parkinsonian, on seroquel may be PD or medication related.  MRI Brain: following: 1.   Mild to moderate generalized cortical atrophy, most pronounced in the frontal and parietal convexities and progressed compared to the 2007 CT scan. 2.   Just a couple T2/FLAIR hyperintense foci in the hemispheres consistent with minimal age-appropriate chronic microvascular ischemic changes. 3.   No acute findings. 4.   Normal enhancement pattern.  MRI Cervical spine: IMPRESSION: This MRI of the cervical spine without contrast shows the following: 1.   The spinal cord appears normal. 2.   At C3-C4, degenerative changes cause borderline spinal stenosis and foraminal narrowing.  There does not appear to be nerve root compression. 3.   At C4-C5, degenerative changes cause mild spinal stenosis and moderately severe left and moderate right foraminal narrowing.  There is potential for left C5 nerve root compression. 4.   At C5-C6, degenerative changes: This moderate spinal stenosis and moderately severe right and moderate left foraminal narrowing.  There is potential for right C6 nerve root compression. 5.   Milder degenerative changes at C6-C7 and C2-C3 do not lead to spinal stenosis or nerve root compression.  )  Orders Placed This Encounter  Procedures   MR BRAIN W WO CONTRAST   MR CERVICAL SPINE WO CONTRAST    No orders of the defined types were placed in this encounter.   Cc: [name], MD,  [name], MD  Sarina Ill, MD  [facility] [address] [place], [place] [address]  Phone [phone] Fax [phone]  I spent over 70 minutes of face-to-face and non-face-to-face time with patient on the  1. Ataxia   2. Gait abnormality   3. Fall, initial encounter   4. Abnormal DTR (deep tendon reflex)   5. Parkinsonism, unspecified Parkinsonism type ([place])    diagnosis.  This included previsit chart review, lab review, study review, order entry, electronic health record documentation, patient education on the different diagnostic and therapeutic options, counseling and coordination of care, risks and benefits of management, compliance, or risk factor reduction

## 2021-03-04 ENCOUNTER — Telehealth: Payer: Self-pay | Admitting: Neurology

## 2021-03-04 NOTE — Telephone Encounter (Signed)
Received signed patient consent for DaTscan. Completed all ppw for DaTscan referral. Given to MD to sign.

## 2021-03-04 NOTE — Telephone Encounter (Signed)
MRI cervical spine wo contrast & brain w/wo contrast [employer] medicare auth: NPR via [facility] website  Sent to GI for scheduling

## 2021-03-08 NOTE — Telephone Encounter (Signed)
Faxed signed DaTscan ppw to [facility] [place]. They will review and obtain auth if needed. They will notify me of outcome.

## 2021-03-08 NOTE — Addendum Note (Signed)
Addended by: Sarina Ill B on: 03/08/2021 03:27 PM   Modules accepted: Orders

## 2021-03-09 LAB — CULTURE, FUNGUS WITHOUT SMEAR

## 2021-03-09 NOTE — Telephone Encounter (Signed)
Received response from [employer]. Patient's insurance required authorization and that was obtained. PA #[id number] (03/08/21- 04/22/21). Case [id number]. [place] phone: [phone]. Patient is ready for scheduling. Sent to [employer].

## 2021-03-14 ENCOUNTER — Ambulatory Visit
Admission: RE | Admit: 2021-03-14 | Discharge: 2021-03-14 | Disposition: A | Discharge status: Home / Self Care | Payer: Medicare Other | Source: Ambulatory Visit | Attending: Neurology | Admitting: Neurology

## 2021-03-14 DIAGNOSIS — G20 Parkinson's disease: Secondary | ICD-10-CM

## 2021-03-14 DIAGNOSIS — R270 Ataxia, unspecified: Secondary | ICD-10-CM

## 2021-03-14 DIAGNOSIS — R292 Abnormal reflex: Secondary | ICD-10-CM

## 2021-03-14 DIAGNOSIS — W19XXXA Unspecified fall, initial encounter: Secondary | ICD-10-CM

## 2021-03-14 DIAGNOSIS — R269 Unspecified abnormalities of gait and mobility: Secondary | ICD-10-CM

## 2021-03-14 MED ORDER — GADOBENATE DIMEGLUMINE 529 MG/ML IV SOLN
20.0000 mL | Freq: Once | INTRAVENOUS | Status: AC | PRN
  Administered 2021-03-14: 20 mL via INTRAVENOUS

## 2021-03-18 ENCOUNTER — Telehealth: Payer: Self-pay | Admitting: *Deleted

## 2021-03-18 NOTE — Telephone Encounter (Addendum)
Spoke with patient's wife Jeremy Shannon (on [place]).  They had seen the results on MyChart of MRI brain and C-spine.  Her questions were answered.  I see [facility] has tried to reach them regarding scheduling the DaTscan so I gave the patient's wife Pedro Earls number for her to call.  She will also call our office to schedule a follow-up after the DaTscan has been scheduled.  She verbalized appreciation for the call.  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 I think getting the DAT scan next is still a good idea and thenfollowing up with me to discuss would be a good plan. Thank you  Written by Melvenia Beam, MD on 03/16/2021  8:42 PM EDT Seen by patient Jeremy Shannon on 03/17/2021  2:03 AM

## 2021-03-26 ENCOUNTER — Inpatient Hospital Stay (HOSPITAL_COMMUNITY)
Admission: EM | Admit: 2021-03-26 | Discharge: 2021-03-29 | DRG: 897 | Disposition: A | Discharge status: Home / Self Care | Payer: Medicare Other | Attending: Internal Medicine | Admitting: Internal Medicine

## 2021-03-26 ENCOUNTER — Encounter (HOSPITAL_COMMUNITY): Payer: Self-pay

## 2021-03-26 ENCOUNTER — Emergency Department (HOSPITAL_COMMUNITY): Payer: Medicare Other

## 2021-03-26 ENCOUNTER — Encounter: Payer: Self-pay | Admitting: Internal Medicine

## 2021-03-26 ENCOUNTER — Other Ambulatory Visit: Payer: Self-pay

## 2021-03-26 ENCOUNTER — Emergency Department (HOSPITAL_COMMUNITY)
Admission: EM | Admit: 2021-03-26 | Discharge: 2021-03-26 | Disposition: A | Discharge status: Home / Self Care | Payer: Medicare Other | Source: Home / Self Care | Attending: Emergency Medicine | Admitting: Emergency Medicine

## 2021-03-26 ENCOUNTER — Other Ambulatory Visit: Payer: Self-pay | Admitting: Internal Medicine

## 2021-03-26 DIAGNOSIS — E119 Type 2 diabetes mellitus without complications: Secondary | ICD-10-CM

## 2021-03-26 DIAGNOSIS — T424X6A Underdosing of benzodiazepines, initial encounter: Secondary | ICD-10-CM | POA: Diagnosis present

## 2021-03-26 DIAGNOSIS — Z79899 Other long term (current) drug therapy: Secondary | ICD-10-CM | POA: Insufficient documentation

## 2021-03-26 DIAGNOSIS — Z7951 Long term (current) use of inhaled steroids: Secondary | ICD-10-CM | POA: Insufficient documentation

## 2021-03-26 DIAGNOSIS — F13230 Sedative, hypnotic or anxiolytic dependence with withdrawal, uncomplicated: Principal | ICD-10-CM

## 2021-03-26 DIAGNOSIS — E785 Hyperlipidemia, unspecified: Secondary | ICD-10-CM | POA: Insufficient documentation

## 2021-03-26 DIAGNOSIS — R42 Dizziness and giddiness: Secondary | ICD-10-CM | POA: Diagnosis present

## 2021-03-26 DIAGNOSIS — E1136 Type 2 diabetes mellitus with diabetic cataract: Secondary | ICD-10-CM | POA: Insufficient documentation

## 2021-03-26 DIAGNOSIS — R110 Nausea: Secondary | ICD-10-CM | POA: Diagnosis present

## 2021-03-26 DIAGNOSIS — F419 Anxiety disorder, unspecified: Secondary | ICD-10-CM

## 2021-03-26 DIAGNOSIS — R29818 Other symptoms and signs involving the nervous system: Secondary | ICD-10-CM

## 2021-03-26 DIAGNOSIS — F13231 Sedative, hypnotic or anxiolytic dependence with withdrawal delirium: Principal | ICD-10-CM | POA: Diagnosis present

## 2021-03-26 DIAGNOSIS — G20 Parkinson's disease: Secondary | ICD-10-CM | POA: Diagnosis present

## 2021-03-26 DIAGNOSIS — R918 Other nonspecific abnormal finding of lung field: Secondary | ICD-10-CM | POA: Diagnosis not present

## 2021-03-26 DIAGNOSIS — R259 Unspecified abnormal involuntary movements: Secondary | ICD-10-CM

## 2021-03-26 DIAGNOSIS — Z7984 Long term (current) use of oral hypoglycemic drugs: Secondary | ICD-10-CM

## 2021-03-26 DIAGNOSIS — E1140 Type 2 diabetes mellitus with diabetic neuropathy, unspecified: Secondary | ICD-10-CM | POA: Insufficient documentation

## 2021-03-26 DIAGNOSIS — R410 Disorientation, unspecified: Secondary | ICD-10-CM | POA: Diagnosis not present

## 2021-03-26 DIAGNOSIS — F17210 Nicotine dependence, cigarettes, uncomplicated: Secondary | ICD-10-CM | POA: Insufficient documentation

## 2021-03-26 DIAGNOSIS — E1169 Type 2 diabetes mellitus with other specified complication: Secondary | ICD-10-CM | POA: Insufficient documentation

## 2021-03-26 DIAGNOSIS — F32A Depression, unspecified: Secondary | ICD-10-CM | POA: Diagnosis present

## 2021-03-26 DIAGNOSIS — I10 Essential (primary) hypertension: Secondary | ICD-10-CM | POA: Diagnosis not present

## 2021-03-26 DIAGNOSIS — Z6836 Body mass index (BMI) 36.0-36.9, adult: Secondary | ICD-10-CM

## 2021-03-26 DIAGNOSIS — F05 Delirium due to known physiological condition: Secondary | ICD-10-CM

## 2021-03-26 DIAGNOSIS — Z20822 Contact with and (suspected) exposure to covid-19: Secondary | ICD-10-CM | POA: Diagnosis present

## 2021-03-26 DIAGNOSIS — T426X5A Adverse effect of other antiepileptic and sedative-hypnotic drugs, initial encounter: Secondary | ICD-10-CM | POA: Diagnosis not present

## 2021-03-26 DIAGNOSIS — F13930 Sedative, hypnotic or anxiolytic use, unspecified with withdrawal, uncomplicated: Secondary | ICD-10-CM

## 2021-03-26 DIAGNOSIS — Z800 Family history of malignant neoplasm of digestive organs: Secondary | ICD-10-CM

## 2021-03-26 DIAGNOSIS — R4182 Altered mental status, unspecified: Secondary | ICD-10-CM | POA: Diagnosis present

## 2021-03-26 DIAGNOSIS — R001 Bradycardia, unspecified: Secondary | ICD-10-CM | POA: Diagnosis not present

## 2021-03-26 DIAGNOSIS — Z91138 Patient's unintentional underdosing of medication regimen for other reason: Secondary | ICD-10-CM

## 2021-03-26 DIAGNOSIS — R911 Solitary pulmonary nodule: Secondary | ICD-10-CM | POA: Diagnosis present

## 2021-03-26 DIAGNOSIS — F411 Generalized anxiety disorder: Secondary | ICD-10-CM

## 2021-03-26 DIAGNOSIS — R339 Retention of urine, unspecified: Secondary | ICD-10-CM | POA: Diagnosis not present

## 2021-03-26 DIAGNOSIS — R269 Unspecified abnormalities of gait and mobility: Secondary | ICD-10-CM | POA: Diagnosis present

## 2021-03-26 LAB — LIPASE, BLOOD: Lipase: 30 U/L (ref 11–51)

## 2021-03-26 LAB — COMPREHENSIVE METABOLIC PANEL
ALT: 20 U/L (ref 0–44)
AST: 15 U/L (ref 15–41)
Albumin: 4.1 g/dL (ref 3.5–5.0)
Alkaline Phosphatase: 58 U/L (ref 38–126)
Anion gap: 9 (ref 5–15)
BUN: 10 mg/dL (ref 8–23)
CO2: 22 mmol/L (ref 22–32)
Calcium: 9.1 mg/dL (ref 8.9–10.3)
Chloride: 107 mmol/L (ref 98–111)
Creatinine, Ser: 1.02 mg/dL (ref 0.61–1.24)
GFR, Estimated: >60 mL/min (ref >60)
Glucose, Bld: 118 mg/dL — ABNORMAL HIGH (ref 70–99)
Potassium: 4.2 mmol/L (ref 3.5–5.1)
Sodium: 138 mmol/L (ref 135–145)
Total Bilirubin: 0.5 mg/dL (ref 0.3–1.2)
Total Protein: 6.9 g/dL (ref 6.5–8.1)

## 2021-03-26 LAB — CBC WITH DIFFERENTIAL/PLATELET
Abs Immature Granulocytes: 0.03 10*3/uL (ref 0.00–0.07)
Basophils Absolute: 0 10*3/uL (ref 0.0–0.1)
Basophils Relative: 0 %
Eosinophils Absolute: 0 10*3/uL (ref 0.0–0.5)
Eosinophils Relative: 0 %
HCT: 46.2 % (ref 39.0–52.0)
Hemoglobin: 15.6 g/dL (ref 13.0–17.0)
Immature Granulocytes: 0 %
Lymphocytes Relative: 18 %
Lymphs Abs: 1.6 10*3/uL (ref 0.7–4.0)
MCH: 31.2 pg (ref 26.0–34.0)
MCHC: 33.8 g/dL (ref 30.0–36.0)
MCV: 92.4 fL (ref 80.0–100.0)
Monocytes Absolute: 0.4 10*3/uL (ref 0.1–1.0)
Monocytes Relative: 5 %
Neutro Abs: 6.4 10*3/uL (ref 1.7–7.7)
Neutrophils Relative %: 77 %
Platelets: 242 10*3/uL (ref 150–400)
RBC: 5 MIL/uL (ref 4.22–5.81)
RDW: 13.1 % (ref 11.5–15.5)
WBC: 8.4 10*3/uL (ref 4.0–10.5)
nRBC: 0 % (ref 0.0–0.2)

## 2021-03-26 LAB — RESP PANEL BY RT-PCR (FLU A&B, COVID) ARPGX2
Influenza A by PCR: NEGATIVE
Influenza B by PCR: NEGATIVE
SARS Coronavirus 2 by RT PCR: NEGATIVE

## 2021-03-26 LAB — ETHANOL: Alcohol, Ethyl (B): <10 mg/dL (ref <10)

## 2021-03-26 LAB — AMMONIA: Ammonia: 16 umol/L (ref 9–35)

## 2021-03-26 MED ORDER — HYDROXYZINE HCL 25 MG PO TABS: 25.0000 mg | ORAL_TABLET | Freq: Four times a day (QID) | ORAL | 0 refills | Status: DC

## 2021-03-26 MED ORDER — ENOXAPARIN SODIUM 60 MG/0.6ML IJ SOSY
55.0000 mg | PREFILLED_SYRINGE | Freq: Every day | INTRAMUSCULAR | Status: DC
  Administered 2021-03-27 – 2021-03-28 (×3): 55 mg via SUBCUTANEOUS
  Filled 2021-03-26 (×2): qty 0.55
  Filled 2021-03-26: qty 0.6
  Filled 2021-03-26 (×2): qty 0.55

## 2021-03-26 MED ORDER — ALPRAZOLAM 1 MG PO TABS: 1.0000 mg | ORAL_TABLET | Freq: Three times a day (TID) | ORAL | 1 refills | Status: DC | PRN

## 2021-03-26 MED ORDER — LORAZEPAM 2 MG/ML IJ SOLN
1.0000 mg | INTRAMUSCULAR | Status: DC | PRN
  Administered 2021-03-27 (×2): 2 mg via INTRAVENOUS
  Filled 2021-03-26 (×2): qty 1

## 2021-03-26 MED ORDER — DIAZEPAM 5 MG/ML IJ SOLN
5.0000 mg | Freq: Once | INTRAMUSCULAR | Status: AC
  Administered 2021-03-27: 5 mg via INTRAVENOUS
  Filled 2021-03-26: qty 2

## 2021-03-26 MED ORDER — HYDROXYZINE HCL 25 MG PO TABS
50.0000 mg | ORAL_TABLET | Freq: Once | ORAL | Status: AC
  Administered 2021-03-26: 50 mg via ORAL
  Filled 2021-03-26: qty 2

## 2021-03-26 MED ORDER — LORAZEPAM 1 MG PO TABS
1.0000 mg | ORAL_TABLET | ORAL | Status: DC | PRN
Start: 2021-03-26 — End: 2021-03-27
  Administered 2021-03-27: 3 mg via ORAL
  Administered 2021-03-27 (×3): 2 mg via ORAL
  Filled 2021-03-26: qty 3
  Filled 2021-03-26 (×3): qty 2

## 2021-03-26 MED ORDER — LORAZEPAM 2 MG/ML IJ SOLN
2.0000 mg | Freq: Once | INTRAMUSCULAR | Status: AC
  Administered 2021-03-26: 2 mg via INTRAVENOUS
  Filled 2021-03-26: qty 1

## 2021-03-26 MED ORDER — SODIUM CHLORIDE 0.9 % IV BOLUS
1000.0000 mL | Freq: Once | INTRAVENOUS | Status: AC
  Administered 2021-03-26: 1000 mL via INTRAVENOUS

## 2021-03-26 MED ORDER — LORAZEPAM 2 MG/ML IJ SOLN
1.0000 mg | INTRAMUSCULAR | Status: DC | PRN
  Administered 2021-03-26: 1 mg via INTRAVENOUS
  Filled 2021-03-26: qty 1

## 2021-03-26 NOTE — ED Notes (Signed)
Bladder scan showed 597 mL.
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
## 2021-03-26 NOTE — ED Triage Notes (Signed)
Patient states he is prescribed Xanax 1 mg and ran out before prescription was due to be refilled. Patient states he has not had Xanax in 4 days and is having withdrawal symptoms. Patient states his psychiatrist would not refill the prescription. Patient states he will able to refill the prescription in 3 days. Patient c/o dizziness, chills, and "tremors inside."

## 2021-03-26 NOTE — ED Notes (Signed)
Patient transported to CT 

## 2021-03-26 NOTE — Discharge Instructions (Addendum)
Call your psychiatrist today for medication management.  Take the Vistaril as directed for anxiety

## 2021-03-26 NOTE — ED Triage Notes (Signed)
Seen at [facility] this AM for increased anxiety, pacing. Ran out of Xanax meds x 3 days. Pt filled his meds on June 20 so unsure if pt has been taking his meds as prescribed. AMS since this AM. Unable to follow commands, difficult to awaken and involuntary movements per EMS since 8AM. Pt is getting worked up for Parkinson's disease currently and has shuffling gait for a while now.

## 2021-03-26 NOTE — ED Provider Notes (Signed)
[facility] EMERGENCY DEPARTMENT Provider Note   CSN: [id number] Arrival date & time: 03/26/21  1613     History Chief Complaint  Patient presents with   Altered Mental Status    Jeremy Shannon is a 65 y.o. male with history of type 2 diabetes, chronic anxiety, early Parkinson's disease, presenting to emergency department company of his wife with concern for altered mental status.  His wife reports the patient has been taking Xanax for 30 years.  He ran out of his prescription approximately 1 week ago.  He is due for refill on the 20th.  He is prescribed Xanax 1 mg 3 times a day on a monthly basis.  This is confirmed by PDMP.  She reports it has been increasingly erratic and behaving strangely since then.  He seemed tremulous and confused today.  He went originally to the [facility] emergency department this morning, where he was discharged with a new prescription for anxiety medication and had a psychiatrist represcribe Xanax.    His wife brought him home but was concerned that he was increasingly shaky and erratic.  She says she has never seen in this confused.  She denies any witnessed seizure activity or history of seizures.  She is concerned that he may have mixed up his other medications at home.  The patient himself is generally poor historian.  He said he feels shaky.  He says he feels nauseous.  He cannot recall what other medicines he may have taken today.  HPI     Past Medical History:  Diagnosis Date   Anemia    pt denies   Anxiety    Cataract    Depression    Diabetes mellitus without complication ([place])    type 2   Dyspnea    Hyperlipidemia    PONV (postoperative nausea and vomiting)     Patient Active Problem List   Diagnosis Date Noted   Type 2 diabetes mellitus ([place]) 03/27/2021   HTN (hypertension) 03/27/2021   Parkinsonian features 03/27/2021   AMS (altered mental status) 03/26/2021   Lung mass 01/29/2021   Need for DTP vaccine  11/15/2020   Pure hyperglyceridemia 11/12/2020   Neuropathy 11/11/2020   Type II diabetes mellitus with complication ([place]) 56/31/4970   Deficiency anemia 11/11/2020   Hyperlipidemia with target LDL less than 100 11/11/2020   Encounter for general adult medical examination with abnormal findings 11/11/2020   Loud snoring 11/11/2020   Cough 11/11/2020   Simple chronic bronchitis ([place]) 11/11/2020   DOE (dyspnea on exertion) 11/11/2020   ANXIETY 12/17/2007   Smokers' cough ([place]) 12/17/2007   DEPRESSION 12/17/2007   COLONIC POLYPS 05/07/2007    Past Surgical History:  Procedure Laterality Date   BRONCHIAL BIOPSY  02/16/2021   Procedure: BRONCHIAL BIOPSIES;  Surgeon: Garner Nash, DO;  Location: [facility];  Service: Pulmonary;;   BRONCHIAL BRUSHINGS  02/16/2021   Procedure: BRONCHIAL BRUSHINGS;  Surgeon: Garner Nash, DO;  Location: [facility];  Service: Pulmonary;;   BRONCHIAL NEEDLE ASPIRATION BIOPSY  02/16/2021   Procedure: BRONCHIAL NEEDLE ASPIRATION BIOPSIES;  Surgeon: Garner Nash, DO;  Location: [facility];  Service: Pulmonary;;   BRONCHIAL WASHINGS  02/16/2021   Procedure: BRONCHIAL WASHINGS;  Surgeon: Garner Nash, DO;  Location: [facility];  Service: Pulmonary;;   CATARACT EXTRACTION     bilateral   CHOLECYSTECTOMY N/A 02/10/2017   Procedure: LAPAROSCOPIC CHOLECYSTECTOMY WITH INTRAOPERATIVE CHOLANGIOGRAM;  Surgeon: Alphonsa Overall, MD;  Location: [facility];  Service: General;  Laterality: N/A;   COLONOSCOPY     FIDUCIAL MARKER PLACEMENT  02/16/2021   Procedure: FIDUCIAL MARKER PLACEMENT;  Surgeon: Garner Nash, DO;  Location: [facility];  Service: Pulmonary;;   VIDEO BRONCHOSCOPY WITH ENDOBRONCHIAL NAVIGATION Right 02/16/2021   Procedure: VIDEO BRONCHOSCOPY WITH ENDOBRONCHIAL NAVIGATION;  Surgeon: Garner Nash, DO;  Location: [facility];  Service: Pulmonary;  Laterality: Right;       Family History  Problem Relation Age of Onset   Liver cancer Mother     Depression Father    Drug abuse Father     Social History   Tobacco Use   Smoking status: Every Day    Packs/day: 1.00    Years: 43.00    Pack years: 43.00    Types: Cigarettes   Smokeless tobacco: Never   Tobacco comments:    10-20 cigarettes smoked daily 02/17/2021  Vaping Use   Vaping Use: Never used  Substance Use Topics   Alcohol use: No   Drug use: No    Home Medications Prior to Admission medications   Medication Sig Start Date End Date Taking? Authorizing Provider  ALPRAZolam Duanne Moron) 1 MG tablet Take 1 tablet (1 mg total) by mouth 3 (three) times daily as needed for anxiety. 03/26/21   [name], MD  brexpiprazole (REXULTI) 1 MG TABS tablet Take 1 mg by mouth daily.    [provider]  brexpiprazole (REXULTI) 2 MG TABS tablet Take 2 mg by mouth daily.    [provider]  empagliflozin (JARDIANCE) 10 MG TABS tablet Take 10 mg by mouth daily.    [provider]  Fluticasone-Umeclidin-Vilant (TRELEGY ELLIPTA) 100-62.5-25 MCG/INH AEPB Inhale 1 puff into the lungs daily. Patient taking differently: Inhale 1 puff into the lungs daily as needed (shortness of breath/wheezing). 01/29/21   Icard, Octavio Graves, DO  fluvoxaMINE (LUVOX) 100 MG tablet Take 100 mg by mouth 3 (three) times daily.    [provider]  hydrOXYzine (ATARAX/VISTARIL) 25 MG tablet Take 1 tablet (25 mg total) by mouth every 6 (six) hours. 03/26/21   Lacretia Leigh, MD  icosapent Ethyl (VASCEPA) 1 g capsule Take 2 capsules (2 g total) by mouth 2 (two) times daily. 11/12/20   [name], MD  metFORMIN (GLUCOPHAGE) 500 MG tablet Take by mouth 2 (two) times daily with a meal.    [provider]  QUEtiapine (SEROQUEL) 100 MG tablet Take 300 mg by mouth at bedtime.     [provider]  simvastatin (ZOCOR) 20 MG tablet Take 1 tablet (20 mg total) by mouth at bedtime. 11/12/20   [name], MD  SUMAtriptan (IMITREX) 100 MG tablet Take 100 mg by mouth every 2  (two) hours as needed for migraine. May repeat in 2 hours if headache persists or recurs.    [provider]  SYNJARDY 5-500 MG TABS TAKE 1 TABLET BY MOUTH TWICE DAILY Patient taking differently: Take 1 tablet by mouth 2 (two) times daily. Empagliflozin/metformin 02/12/21   [name], MD  topiramate (TOPAMAX) 25 MG tablet Take 25 mg by mouth daily. 02/22/21   [provider]  Vitamin D, Ergocalciferol, (DRISDOL) 1.25 MG (50000 UNIT) CAPS capsule Take 50,000 Units by mouth once a week. 02/26/21   [provider]    Allergies    Patient has no known allergies.  Review of Systems   Review of Systems  Unable to perform ROS: Mental status change (LEVEL 5 CAVEAT)  Physical Exam Updated Vital Signs BP (!) 127/92 (BP Location: Left Arm)   Pulse 89   Temp 98 F (36.7 C) (Oral)   Resp 16   Ht 5\' 8"  (1.727 m)   Wt 108 kg   SpO2 93%   BMI 36.20 kg/m   Physical Exam Constitutional:      General: He is not in acute distress.    Appearance: He is diaphoretic.  HENT:     Head: Normocephalic and atraumatic.  Eyes:     Conjunctiva/sclera: Conjunctivae normal.     Pupils: Pupils are equal, round, and reactive to light.  Cardiovascular:     Rate and Rhythm: Regular rhythm. Tachycardia present.  Pulmonary:     Effort: Pulmonary effort is normal. No respiratory distress.  Abdominal:     General: There is no distension.     Tenderness: There is no abdominal tenderness.  Skin:    General: Skin is warm.  Neurological:     General: No focal deficit present.     Mental Status: He is alert. Mental status is at baseline.     Comments: Bilateral arm tremors    ED Results / Procedures / Treatments   Labs (all labs ordered are listed, but only abnormal results are displayed) Labs Reviewed  COMPREHENSIVE METABOLIC PANEL - Abnormal; Notable for the following components:      Result Value   Glucose, Bld 118 (*)    All other components within normal limits  RAPID  URINE DRUG SCREEN, HOSP PERFORMED - Abnormal; Notable for the following components:   Benzodiazepines POSITIVE (*)    All other components within normal limits  URINALYSIS, ROUTINE W REFLEX MICROSCOPIC - Abnormal; Notable for the following components:   Glucose, UA >=500 (*)    Ketones, ur 20 (*)    All other components within normal limits  COMPREHENSIVE METABOLIC PANEL - Abnormal; Notable for the following components:   CO2 18 (*)    Glucose, Bld 101 (*)    Calcium 8.5 (*)    Total Protein 6.1 (*)    All other components within normal limits  GLUCOSE, CAPILLARY - Abnormal; Notable for the following components:   Glucose-Capillary 133 (*)    All other components within normal limits  GLUCOSE, CAPILLARY - Abnormal; Notable for the following components:   Glucose-Capillary 114 (*)    All other components within normal limits  RESP PANEL BY RT-PCR (FLU A&B, COVID) ARPGX2  CBC WITH DIFFERENTIAL/PLATELET  AMMONIA  LIPASE, BLOOD  ETHANOL  HIV ANTIBODY (ROUTINE TESTING W REFLEX)    EKG EKG Interpretation  Date/Time:  Friday March 26 2021 16:31:16 EDT Ventricular Rate:  113 PR Interval:  180 QRS Duration: 104 QT Interval:  323 QTC Calculation: 443 R Axis:   264 Text Interpretation: Sinus tachycardia LAD, consider left anterior fascicular block Baseline motion artifact from tremors - no widened QRS or prolonged QTC Confirmed by Octaviano Glow [phone]) on 03/26/2021 5:30:44 PM  Radiology CT Head Wo Contrast  Result Date: 03/26/2021 CLINICAL DATA:  Mental status change EXAM: CT HEAD WITHOUT CONTRAST TECHNIQUE: Contiguous axial images were obtained from the base of the skull through the vertex without intravenous contrast. COMPARISON:  MRI 03/14/2021, CT brain 11/25/2005 FINDINGS: Brain: No acute territorial infarction, hemorrhage or intracranial mass. Moderate-to-marked atrophy. The ventricles are nonenlarged. Vascular: No hyperdense vessels.  Carotid vascular calcification Skull:  Normal. Negative for fracture or focal lesion. Sinuses/Orbits: No acute finding. Other: None IMPRESSION: 1. No CT evidence for acute intracranial abnormality.  2. Atrophy Electronically Signed   By: Donavan Foil M.D.   On: 03/26/2021 20:18    Procedures Procedures   Medications Ordered in ED Medications  enoxaparin (LOVENOX) injection 55 mg (55 mg Subcutaneous Given 03/27/21 0053)  LORazepam (ATIVAN) tablet 1-4 mg (2 mg Oral Given 03/27/21 0910)    Or  LORazepam (ATIVAN) injection 1-4 mg ( Intravenous See Alternative 03/27/21 0910)  sodium chloride 0.9 % bolus 1,000 mL (0 mLs Intravenous Stopped 03/26/21 1928)  LORazepam (ATIVAN) injection 2 mg (2 mg Intravenous Given 03/26/21 2303)  diazepam (VALIUM) injection 5 mg (5 mg Intravenous Given 03/27/21 0234)    ED Course  I have reviewed the triage vital signs and the nursing notes.  Pertinent labs & imaging results that were available during my care of the patient were reviewed by me and considered in my medical decision making (see chart for details).  This patient presents to the Emergency Department with complaint of altered mental status.  This involves an extensive number of treatment options, and is a complaint that carries with it a high risk of complications and morbidity.  The differential diagnosis includes benzo withdrawal vs hypoglycemia vs metabolic encephalopathy vs infection (including cystitis) vs ICH vs stroke vs polypharmacy vs other  I ordered, reviewed, and interpreted labs, including CMP and CBC unremarkable, no anion gap, electrolytes wnl, no AKI.  Covid negative.  Ammonia and etoh wnl.   I ordered medication IV ativan and IV fluids for suspected benzo withdrawal syndrome and hydration I ordered imaging studies which included CTH I independently visualized and interpreted imaging which showed no focal abnormalities and the monitor tracing which showed sinus rhythm Additional history was obtained from patient's wife at  bedside Previous records obtained and reviewed showing patient's Xanax 1 mg TID monthly prescription confirmed by PDMP, home medications reviewed I personally reviewed the patients ECG which showed sinus rhythm with no acute ischemic findings, no widened QRS or prolonged QTc  Overall he appears to be improving symptomatically and mentally with IV benzos, and appears more relaxed.  I still believe this is likely withdrawal-related.  However we cannot exclude the possibility of polypharmacy or accidental ingestion of his home meds, which includes SSRI's - raising some concern for serotonin syndrome.  Regardless this appears manageable with benzos, and his EKG is not showing acute arrhythmia changes.  He is also not hyperthermic.  We'll plan for overnight admission, discussed with his wife and him who both agree.    Clinical Course as of 03/27/21 1034  Fri Mar 26, 2021  1730 EKG is reviewed.  The patient has some baseline motion artifact from his tremors, but no widened QRS or prolonged QTC. [MT]  1955 Overall the patient's tremulousness and tachycardia improved, although his wife reports his mental status continues to wax and wane.  We have had several erroneous blood pressure readings on his cuff as he is frequently moving when this is inflating. [MT]  2022 IMPRESSION: 1. No CT evidence for acute intracranial abnormality. 2. Atrophy [MT]  2220 Admitted to hospitalist. [MT]  2227 Reassessed the patient.  Explained to him and his wife his work-up was unremarkable.  He continues to feel extremely anxious.  BP 128/82 in the room.  We will give him another dose of Ativan.  He his wife also reports he has not urinated in approximately 12 hours.  We will BladderScan him and catheterize him as necessary. [MT]    Clinical Course User Index [MT] Eleanna Theilen, Carola Rhine, MD  Final Clinical Impression(s) / ED Diagnoses Final diagnoses:  Benzodiazepine withdrawal without complication ([place])  Acute confusional  state    Rx / DC Orders ED Discharge Orders     None        Wyvonnia Dusky, MD 03/27/21 1034

## 2021-03-26 NOTE — ED Provider Notes (Addendum)
[facility] DEPT Provider Note   CSN: [id number] Arrival date & time: 03/26/21  3267     History Chief Complaint  Patient presents with   Withdrawal    Jeremy Shannon is a 65 y.o. male.  Six 84-year-old male who presents with increased anxiety after being out of his Xanax for the past several days.  States that he normally takes 4 mg of Xanax a day.  States he has been using it more than he should.  Reviewed the drug database and patient does get a regular prescription of Xanax about the 20th of every month.  Patient spoke to his psychiatrist who he states has decided not to renew his prescription.  States he has been 4 days since he has had his medication.      Past Medical History:  Diagnosis Date   Anemia    pt denies   Anxiety    Cataract    Depression    Diabetes mellitus without complication ([place])    type 2   Dyspnea    Hyperlipidemia    PONV (postoperative nausea and vomiting)     Patient Active Problem List   Diagnosis Date Noted   Lung mass 01/29/2021   Need for DTP vaccine 11/15/2020   Pure hyperglyceridemia 11/12/2020   Neuropathy 11/11/2020   Type II diabetes mellitus with complication ([place]) 12/45/8099   Deficiency anemia 11/11/2020   Hyperlipidemia with target LDL less than 100 11/11/2020   Encounter for general adult medical examination with abnormal findings 11/11/2020   Loud snoring 11/11/2020   Cough 11/11/2020   Simple chronic bronchitis ([place]) 11/11/2020   DOE (dyspnea on exertion) 11/11/2020   ANXIETY 12/17/2007   Smokers' cough ([place]) 12/17/2007   DEPRESSION 12/17/2007   COLONIC POLYPS 05/07/2007    Past Surgical History:  Procedure Laterality Date   BRONCHIAL BIOPSY  02/16/2021   Procedure: BRONCHIAL BIOPSIES;  Surgeon: Garner Nash, DO;  Location: [facility];  Service: Pulmonary;;   BRONCHIAL BRUSHINGS  02/16/2021   Procedure: BRONCHIAL BRUSHINGS;  Surgeon: Garner Nash, DO;  Location: [facility];   Service: Pulmonary;;   BRONCHIAL NEEDLE ASPIRATION BIOPSY  02/16/2021   Procedure: BRONCHIAL NEEDLE ASPIRATION BIOPSIES;  Surgeon: Garner Nash, DO;  Location: [facility];  Service: Pulmonary;;   BRONCHIAL WASHINGS  02/16/2021   Procedure: BRONCHIAL WASHINGS;  Surgeon: Garner Nash, DO;  Location: [facility];  Service: Pulmonary;;   CATARACT EXTRACTION     bilateral   CHOLECYSTECTOMY N/A 02/10/2017   Procedure: LAPAROSCOPIC CHOLECYSTECTOMY WITH INTRAOPERATIVE CHOLANGIOGRAM;  Surgeon: Alphonsa Overall, MD;  Location: [facility];  Service: General;  Laterality: N/A;   COLONOSCOPY     FIDUCIAL MARKER PLACEMENT  02/16/2021   Procedure: FIDUCIAL MARKER PLACEMENT;  Surgeon: Garner Nash, DO;  Location: [facility];  Service: Pulmonary;;   VIDEO BRONCHOSCOPY WITH ENDOBRONCHIAL NAVIGATION Right 02/16/2021   Procedure: VIDEO BRONCHOSCOPY WITH ENDOBRONCHIAL NAVIGATION;  Surgeon: Garner Nash, DO;  Location: [facility];  Service: Pulmonary;  Laterality: Right;       Family History  Problem Relation Age of Onset   Liver cancer Mother    Depression Father    Drug abuse Father     Social History   Tobacco Use   Smoking status: Every Day    Packs/day: 1.00    Years: 43.00    Pack years: 43.00    Types: Cigarettes   Smokeless tobacco: Never   Tobacco comments:    10-20 cigarettes smoked  daily 02/17/2021  Vaping Use   Vaping Use: Never used  Substance Use Topics   Alcohol use: No   Drug use: No    Home Medications Prior to Admission medications   Medication Sig Start Date End Date Taking? Authorizing Provider  ALPRAZolam Duanne Moron) 1 MG tablet Take 1 tablet (1 mg total) by mouth 3 (three) times daily as needed for anxiety. 03/26/21   [name], MD  Fluticasone-Umeclidin-Vilant (TRELEGY ELLIPTA) 100-62.5-25 MCG/INH AEPB Inhale 1 puff into the lungs daily. 01/29/21   Icard, Octavio Graves, DO  fluvoxaMINE (LUVOX) 100 MG tablet Take 100 mg by mouth 3 (three) times daily.    [provider]  icosapent Ethyl (VASCEPA) 1 g capsule Take 2 capsules (2 g total) by mouth 2 (two) times daily. 11/12/20   [name], MD  JARDIANCE 10 MG TABS tablet Take 10 mg by mouth daily. 03/02/21   [provider]  QUEtiapine (SEROQUEL) 100 MG tablet Take 300 mg by mouth at bedtime.     [provider]  REXULTI 2 MG TABS tablet Take 2 mg by mouth daily. 02/01/21   [provider]  simvastatin (ZOCOR) 20 MG tablet Take 1 tablet (20 mg total) by mouth at bedtime. 11/12/20   [name], MD  SYNJARDY 5-500 MG TABS TAKE 1 TABLET BY MOUTH TWICE DAILY 02/12/21   [name], MD  Vitamin D, Ergocalciferol, (DRISDOL) 1.25 MG (50000 UNIT) CAPS capsule Take 1 capsule by mouth once a week. 02/26/21   [provider]    Allergies    Patient has no known allergies.  Review of Systems   Review of Systems  All other systems reviewed and are negative.  Physical Exam Updated Vital Signs BP (!) 146/103 (BP Location: Left Arm)   Pulse (!) 118   Temp 97.8 F (36.6 C) (Oral)   Resp 20   Ht 1.727 m (5\' 8" )   Wt 108 kg   SpO2 96%   BMI 36.19 kg/m   Physical Exam Vitals and nursing note reviewed.  Constitutional:      General: He is not in acute distress.    Appearance: Normal appearance. He is well-developed. He is not toxic-appearing.  HENT:     Head: Normocephalic and atraumatic.  Eyes:     General: Lids are normal.     Conjunctiva/sclera: Conjunctivae normal.     Pupils: Pupils are equal, round, and reactive to light.  Neck:     Thyroid: No thyroid mass.     Trachea: No tracheal deviation.  Cardiovascular:     Rate and Rhythm: Normal rate and regular rhythm.     Heart sounds: Normal heart sounds. No murmur heard.   No gallop.  Pulmonary:     Effort: Pulmonary effort is normal. No respiratory distress.     Breath sounds: Normal breath sounds. No stridor. No decreased breath sounds, wheezing, rhonchi or rales.  Abdominal:     General: There  is no distension.     Palpations: Abdomen is soft.     Tenderness: There is no abdominal tenderness. There is no rebound.  Musculoskeletal:        General: No tenderness. Normal range of motion.     Cervical back: Normal range of motion and neck supple.  Skin:    General: Skin is warm and dry.     Findings: No abrasion or rash.  Neurological:     Mental Status: He is alert and oriented to  person, place, and time. Mental status is at baseline.     GCS: GCS eye subscore is 4. GCS verbal subscore is 5. GCS motor subscore is 6.     Cranial Nerves: Cranial nerves are intact. No cranial nerve deficit.     Sensory: No sensory deficit.     Motor: Motor function is intact.  Psychiatric:        Attention and Perception: Attention normal.        Mood and Affect: Mood is anxious.        Speech: Speech normal.        Behavior: Behavior normal.    ED Results / Procedures / Treatments   Labs (all labs ordered are listed, but only abnormal results are displayed) Labs Reviewed - No data to display  EKG None  Radiology No results found.  Procedures Procedures   Medications Ordered in ED Medications - No data to display  ED Course  I have reviewed the triage vital signs and the nursing notes.  Pertinent labs & imaging results that were available during my care of the patient were reviewed by me and considered in my medical decision making (see chart for details).    MDM Rules/Calculators/A&P                         Patient is mentating appropriately here.  He is alert and oriented x4. Will prescribe patient Vistaril and have strongly encouraged him to call his psychiatrist today for medication management of his symptoms Final Clinical Impression(s) / ED Diagnoses Final diagnoses:  None    Rx / DC Orders ED Discharge Orders     None        Lacretia Leigh, MD 03/26/21 0910    Lacretia Leigh, MD 03/26/21 [phone]

## 2021-03-26 NOTE — Telephone Encounter (Signed)
Nurse from [facility] called   States the patient had been admitted there today for drug abuse. She has cancelled the refill for the Xanax due to patient misuse of medication.   Please advise.

## 2021-03-27 DIAGNOSIS — E119 Type 2 diabetes mellitus without complications: Secondary | ICD-10-CM

## 2021-03-27 DIAGNOSIS — I10 Essential (primary) hypertension: Secondary | ICD-10-CM

## 2021-03-27 DIAGNOSIS — R269 Unspecified abnormalities of gait and mobility: Secondary | ICD-10-CM | POA: Diagnosis present

## 2021-03-27 DIAGNOSIS — T424X6A Underdosing of benzodiazepines, initial encounter: Secondary | ICD-10-CM | POA: Diagnosis present

## 2021-03-27 DIAGNOSIS — F17210 Nicotine dependence, cigarettes, uncomplicated: Secondary | ICD-10-CM | POA: Diagnosis present

## 2021-03-27 DIAGNOSIS — Z800 Family history of malignant neoplasm of digestive organs: Secondary | ICD-10-CM | POA: Diagnosis not present

## 2021-03-27 DIAGNOSIS — Z20822 Contact with and (suspected) exposure to covid-19: Secondary | ICD-10-CM | POA: Diagnosis present

## 2021-03-27 DIAGNOSIS — Z91138 Patient's unintentional underdosing of medication regimen for other reason: Secondary | ICD-10-CM | POA: Diagnosis not present

## 2021-03-27 DIAGNOSIS — R339 Retention of urine, unspecified: Secondary | ICD-10-CM | POA: Diagnosis not present

## 2021-03-27 DIAGNOSIS — R001 Bradycardia, unspecified: Secondary | ICD-10-CM | POA: Diagnosis not present

## 2021-03-27 DIAGNOSIS — Z7951 Long term (current) use of inhaled steroids: Secondary | ICD-10-CM | POA: Diagnosis not present

## 2021-03-27 DIAGNOSIS — Z79899 Other long term (current) drug therapy: Secondary | ICD-10-CM | POA: Diagnosis not present

## 2021-03-27 DIAGNOSIS — T426X5A Adverse effect of other antiepileptic and sedative-hypnotic drugs, initial encounter: Secondary | ICD-10-CM | POA: Diagnosis not present

## 2021-03-27 DIAGNOSIS — R911 Solitary pulmonary nodule: Secondary | ICD-10-CM | POA: Diagnosis present

## 2021-03-27 DIAGNOSIS — R259 Unspecified abnormal involuntary movements: Secondary | ICD-10-CM

## 2021-03-27 DIAGNOSIS — R410 Disorientation, unspecified: Secondary | ICD-10-CM | POA: Diagnosis not present

## 2021-03-27 DIAGNOSIS — R42 Dizziness and giddiness: Secondary | ICD-10-CM | POA: Diagnosis present

## 2021-03-27 DIAGNOSIS — F32A Depression, unspecified: Secondary | ICD-10-CM | POA: Diagnosis present

## 2021-03-27 DIAGNOSIS — Z7984 Long term (current) use of oral hypoglycemic drugs: Secondary | ICD-10-CM | POA: Diagnosis not present

## 2021-03-27 DIAGNOSIS — Z6836 Body mass index (BMI) 36.0-36.9, adult: Secondary | ICD-10-CM | POA: Diagnosis not present

## 2021-03-27 DIAGNOSIS — F13230 Sedative, hypnotic or anxiolytic dependence with withdrawal, uncomplicated: Secondary | ICD-10-CM | POA: Diagnosis not present

## 2021-03-27 DIAGNOSIS — R110 Nausea: Secondary | ICD-10-CM | POA: Diagnosis present

## 2021-03-27 DIAGNOSIS — F419 Anxiety disorder, unspecified: Secondary | ICD-10-CM | POA: Diagnosis present

## 2021-03-27 DIAGNOSIS — R29818 Other symptoms and signs involving the nervous system: Secondary | ICD-10-CM

## 2021-03-27 DIAGNOSIS — E1140 Type 2 diabetes mellitus with diabetic neuropathy, unspecified: Secondary | ICD-10-CM | POA: Diagnosis present

## 2021-03-27 DIAGNOSIS — G20 Parkinson's disease: Secondary | ICD-10-CM | POA: Diagnosis present

## 2021-03-27 DIAGNOSIS — E785 Hyperlipidemia, unspecified: Secondary | ICD-10-CM | POA: Diagnosis present

## 2021-03-27 DIAGNOSIS — F13231 Sedative, hypnotic or anxiolytic dependence with withdrawal delirium: Secondary | ICD-10-CM | POA: Diagnosis present

## 2021-03-27 LAB — COMPREHENSIVE METABOLIC PANEL
ALT: 18 U/L (ref 0–44)
AST: 18 U/L (ref 15–41)
Albumin: 3.5 g/dL (ref 3.5–5.0)
Alkaline Phosphatase: 51 U/L (ref 38–126)
Anion gap: 10 (ref 5–15)
BUN: 11 mg/dL (ref 8–23)
CO2: 18 mmol/L — ABNORMAL LOW (ref 22–32)
Calcium: 8.5 mg/dL — ABNORMAL LOW (ref 8.9–10.3)
Chloride: 108 mmol/L (ref 98–111)
Creatinine, Ser: 0.95 mg/dL (ref 0.61–1.24)
GFR, Estimated: >60 mL/min (ref >60)
Glucose, Bld: 101 mg/dL — ABNORMAL HIGH (ref 70–99)
Potassium: 3.9 mmol/L (ref 3.5–5.1)
Sodium: 136 mmol/L (ref 135–145)
Total Bilirubin: 0.5 mg/dL (ref 0.3–1.2)
Total Protein: 6.1 g/dL — ABNORMAL LOW (ref 6.5–8.1)

## 2021-03-27 LAB — URINALYSIS, ROUTINE W REFLEX MICROSCOPIC
Bacteria, UA: NONE SEEN
Bilirubin Urine: NEGATIVE
Glucose, UA: >=500 mg/dL — AB
Hgb urine dipstick: NEGATIVE
Ketones, ur: 20 mg/dL — AB
Leukocytes,Ua: NEGATIVE
Nitrite: NEGATIVE
Protein, ur: NEGATIVE mg/dL
Specific Gravity, Urine: 1.029 (ref 1.005–1.030)
pH: 5 (ref 5.0–8.0)

## 2021-03-27 LAB — RAPID URINE DRUG SCREEN, HOSP PERFORMED
Amphetamines: NOT DETECTED
Barbiturates: NOT DETECTED
Benzodiazepines: POSITIVE — AB
Cocaine: NOT DETECTED
Opiates: NOT DETECTED
Tetrahydrocannabinol: NOT DETECTED

## 2021-03-27 LAB — GLUCOSE, CAPILLARY
Glucose-Capillary: 114 mg/dL — ABNORMAL HIGH (ref 70–99)
Glucose-Capillary: 117 mg/dL — ABNORMAL HIGH (ref 70–99)
Glucose-Capillary: 121 mg/dL — ABNORMAL HIGH (ref 70–99)
Glucose-Capillary: 133 mg/dL — ABNORMAL HIGH (ref 70–99)
Glucose-Capillary: 97 mg/dL (ref 70–99)
Glucose-Capillary: 98 mg/dL (ref 70–99)

## 2021-03-27 LAB — MRSA NEXT GEN BY PCR, NASAL: MRSA by PCR Next Gen: NOT DETECTED

## 2021-03-27 LAB — HIV ANTIBODY (ROUTINE TESTING W REFLEX): HIV Screen 4th Generation wRfx: NONREACTIVE

## 2021-03-27 MED ORDER — FAMOTIDINE 20 MG PO TABS
20.0000 mg | ORAL_TABLET | Freq: Two times a day (BID) | ORAL | Status: DC
  Administered 2021-03-27 – 2021-03-29 (×4): 20 mg via ORAL
  Filled 2021-03-27 (×4): qty 1

## 2021-03-27 MED ORDER — QUETIAPINE FUMARATE 100 MG PO TABS
100.0000 mg | ORAL_TABLET | Freq: Every day | ORAL | Status: DC
  Administered 2021-03-27 – 2021-03-28 (×2): 100 mg via ORAL
  Filled 2021-03-27 (×2): qty 1

## 2021-03-27 MED ORDER — DEXMEDETOMIDINE HCL IN NACL 400 MCG/100ML IV SOLN
0.1000 ug/kg/h | INTRAVENOUS | Status: DC
  Administered 2021-03-27: 0.6 ug/kg/h via INTRAVENOUS
  Administered 2021-03-27: 0.4 ug/kg/h via INTRAVENOUS
  Filled 2021-03-27 (×3): qty 100

## 2021-03-27 MED ORDER — CHLORHEXIDINE GLUCONATE CLOTH 2 % EX PADS
6.0000 | MEDICATED_PAD | Freq: Every day | CUTANEOUS | Status: DC
  Administered 2021-03-27 – 2021-03-29 (×3): 6 via TOPICAL

## 2021-03-27 MED ORDER — LORAZEPAM 2 MG/ML IJ SOLN
1.0000 mg | INTRAMUSCULAR | Status: DC | PRN
  Administered 2021-03-28: 1 mg via INTRAVENOUS
  Filled 2021-03-27: qty 1

## 2021-03-27 MED ORDER — INSULIN ASPART 100 UNIT/ML IJ SOLN: 0.0000 [IU] | Freq: Three times a day (TID) | INTRAMUSCULAR | Status: DC

## 2021-03-27 MED ORDER — INSULIN ASPART 100 UNIT/ML IJ SOLN: 0.0000 [IU] | Freq: Every day | INTRAMUSCULAR | Status: DC

## 2021-03-27 MED ORDER — NICOTINE 21 MG/24HR TD PT24
21.0000 mg | MEDICATED_PATCH | Freq: Every day | TRANSDERMAL | Status: DC
  Administered 2021-03-27 – 2021-03-29 (×3): 21 mg via TRANSDERMAL
  Filled 2021-03-27 (×3): qty 1

## 2021-03-27 NOTE — Progress Notes (Signed)
SLP Cancellation Note  Patient Details Name: Jeremy Shannon MRN: [id number] DOB: 01-Nov-1955   Cancelled treatment:       Reason Eval/Treat Not Completed: Medical issues which prohibited therapy. Pt being transferred to ICU per RN. SLP will f/u.   [place] [facility], [place], [facility] Office Number: [phone]  Acie Fredrickson 03/27/2021, 3:02 PM
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
## 2021-03-27 NOTE — Consult Note (Signed)
NAME:  Jeremy Shannon, MRN:  [id number], DOB:  1956/08/02, LOS: 0 ADMISSION DATE:  03/26/2021, CONSULTATION DATE: 03/27/2021 REFERRING MD: Dr. Tawanna Solo, CHIEF COMPLAINT: Agitated delirium  Patient Summary: 65 yo man with hx chronic xanax use.  Admitted with benzo-withdrawal 7/15. To ICU 7/16 for precedex.   History of Present Illness:  65 year old man, smoker (80 pk-yrs) with diabetes/neuropathy, question Parkinson syndrome, hyperlipidemia, severe anxiety and depression.  Known to our service for recent evaluation of right lower lobe pulmonary nodule. Brought to the emergency department 03/26/2021 with anxiety, tremor, hallucinations and delirium.  He has chronically used Xanax for decades, currently 1 mg 4 times daily.  He ran out 4 days prior to admission, question whether he has been dosing higher than 4 times daily.  In the emergency department he was confabulating, hallucinating, tremulous and agitated, tachycardic and hypertensive.  Head CT reassuring.  He was admitted for presumed benzodiazepine withdrawal and started on CIWA protocol with symptom triggered Ativan. On 7/16 he has experienced progressive agitated delirium, only partially responsive to bolused Ativan.  He has had 11 mg total, 6 mg in the last 6 hours.  CIWA scores remain above 25.  PCCM consulted to consider Precedex infusion   Pertinent  Medical History   Past Medical History:  Diagnosis Date   Anemia    pt denies   Anxiety    Cataract    Depression    Diabetes mellitus without complication (HCC)    type 2   Dyspnea    Hyperlipidemia    PONV (postoperative nausea and vomiting)     Significant Hospital Events: Including procedures, antibiotic start and stop dates in addition to other pertinent events   Admitted with agitated delirium 7/15 Head CT 7/15 showed no acute abnormality MRI brain 7/3 mild to moderate generalized cortical atrophy pronounced in the frontal and parietal convexities, age-appropriate  chronic microvascular ischemic changes, no other acute findings PET scan 02/10/2021 > right lower lobe mass without definitive hypermetabolism, decreased in size to 3.1 x 1.5 cm (from 3.4 x 2.0 cm by CT 5/26) Pulmonary function testing 02/17/2021 >> grossly normal airflows without a bronchodilator response, slight restriction of his lung volumes, decreased diffusion capacity that corrects to the normal range when adjusted for alveolar volume   Interim History / Subjective:   Significant agitation, tremor, attempting get out of bed, pull at lines over the last 2 hours.  Finally beginning to calm some with 4 mg of Ativan.  Mittens in place Has been able to take p.o. Just had a bowel movement  Objective   Blood pressure (!) 142/82, pulse 99, temperature 98.2 F (36.8 C), temperature source Oral, resp. rate 20, height 5\' 8"  (1.727 m), weight 108 kg, SpO2 94 %.        Intake/Output Summary (Last 24 hours) at 03/27/2021 1401 Last data filed at 03/27/2021 0100 Gross per 24 hour  Intake 1000 ml  Output 400 ml  Net 600 ml   Filed Weights   03/26/21 1618  Weight: 108 kg    Examination: General: Obese man, laying in bed on his left side, moving his arms HENT: Oropharynx clear, pupils 4 mm, equal, no stridor, strong voice Lungs: Decreased at both bases but otherwise clear bilaterally without any crackles or wheezes Cardiovascular: Regular, borderline tachycardic, no murmur Abdomen: Soft, tympanitic, nondistended, hyperactive bowel sounds Extremities: No edema Neuro: Wakes easily to voice, will open eyes with prompting, tracks.  Speech is clear but nonsensical.  Does try to respond  to questions.  Does not remember situation, poor orientation  Resolved Hospital Problem list     Assessment & Plan:   Benzodiazepine withdrawal with agitated delirium.  Inadequately managed with bolused Ativan based on CIWA scoring. -Plan to move to the ICU for closer monitoring and to initiate Precedex  infusion. -If he remains able to take enteral medications then may be able to start a Librium taper to help wean the Precedex. -Ativan if needed for breakthrough agitation -Continue mittens in place.  Hope to avoid full restraints but may be necessary -Will need to determine his appropriate outpatient Xanax dosing, whether he has been overusing, who will be reliably prescribing.  Will address once he is stabilized from acute events.  Anxiety / Depression -on brexpiprazole as an outpt, presumed for his depression (also an anti-psychotic). Also fluvoxamine, high dose seroquel.  There can be withdrawal with any of these, including agitation.  -will restart lower dose seroquel now -brexpiprazole and fluvoxamine have longer half-lives, will try to restart them 7/17 if possible.   Hypertension, not on outpt meds. Likely due to his agitation and withdrawal -hold off on anti-HTN meds for now, treat underlying process  Parkinsonian symptoms, gait disturbance -reassuring outpatient MRI brain -? Contribution of his anti-depressant regimen which can cause such side effects -? Contribution of his peripheral neuropathy to his gait disturbance  Hyperlipidemia -simvastatin and vascepa on hold  Diabetes mellitus -holding metformin and synjardy for now -SSI and CBG per protocol  Hx tobacco use without significant obstruction on PFT  -nicotine patch -no clear role for BD  RLL nodule -bronchoscopy done 6/7 with negative cytology, and PET scan showed slight decrease in size >> all reassuring.  -outpt re-imaging as planned.   Best Practice (right click and "Reselect all SmartList Selections" daily)   Diet/type: dysphagia diet (see orders) DVT prophylaxis: LMWH GI prophylaxis: H2B Lines: N/A Foley:  N/A Code Status:  full code Last date of multidisciplinary goals of care discussion [pending] Family: Plans discussed with the patient's wife 7/16  Labs   CBC: Recent Labs  Lab 03/26/21 1715   WBC 8.4  NEUTROABS 6.4  HGB 15.6  HCT 46.2  MCV 92.4  PLT 262    Basic Metabolic Panel: Recent Labs  Lab 03/26/21 1715 03/27/21 0220  NA 138 136  K 4.2 3.9  CL 107 108  CO2 22 18*  GLUCOSE 118* 101*  BUN 10 11  CREATININE 1.02 0.95  CALCIUM 9.1 8.5*   GFR: Estimated Creatinine Clearance: 89.9 mL/min (by C-G formula based on SCr of 0.95 mg/dL). Recent Labs  Lab 03/26/21 1715  WBC 8.4    Liver Function Tests: Recent Labs  Lab 03/26/21 1715 03/27/21 0220  AST 15 18  ALT 20 18  ALKPHOS 58 51  BILITOT 0.5 0.5  PROT 6.9 6.1*  ALBUMIN 4.1 3.5   Recent Labs  Lab 03/26/21 1715  LIPASE 30   Recent Labs  Lab 03/26/21 1715  AMMONIA 16    ABG    Component Value Date/Time   TCO2 27 10/22/2009 1257     Coagulation Profile: No results for input(s): INR, PROTIME in the last 168 hours.  Cardiac Enzymes: No results for input(s): CKTOTAL, CKMB, CKMBINDEX, TROPONINI in the last 168 hours.  HbA1C: Hemoglobin A1C  Date/Time Value Ref Range Status  11/11/2020 11:28 AM 7.2 (A) 4.0 - 5.6 % Final   Hgb A1c MFr Bld  Date/Time Value Ref Range Status  02/08/2017 10:00 AM 6.0 (H) 4.8 - 5.6 %  Final    Comment:    (NOTE)         Pre-diabetes: 5.7 - 6.4         Diabetes: >6.4         Glycemic control for adults with diabetes: <7.0     CBG: Recent Labs  Lab 03/27/21 0040 03/27/21 0748 03/27/21 1158  GLUCAP 133* 114* 98    Review of Systems:   Unable to obtain due to the patient's confusion  Past Medical History:  He,  has a past medical history of Anemia, Anxiety, Cataract, Depression, Diabetes mellitus without complication ([place]), Dyspnea, Hyperlipidemia, and PONV (postoperative nausea and vomiting).   Surgical History:   Past Surgical History:  Procedure Laterality Date   BRONCHIAL BIOPSY  02/16/2021   Procedure: BRONCHIAL BIOPSIES;  Surgeon: Garner Nash, DO;  Location: [facility] [facility];  Service: Pulmonary;;   BRONCHIAL BRUSHINGS  02/16/2021    Procedure: BRONCHIAL BRUSHINGS;  Surgeon: Garner Nash, DO;  Location: [place] [facility];  Service: Pulmonary;;   BRONCHIAL NEEDLE ASPIRATION BIOPSY  02/16/2021   Procedure: BRONCHIAL NEEDLE ASPIRATION BIOPSIES;  Surgeon: Garner Nash, DO;  Location: [place] [facility];  Service: Pulmonary;;   BRONCHIAL WASHINGS  02/16/2021   Procedure: BRONCHIAL WASHINGS;  Surgeon: Garner Nash, DO;  Location: [place] [facility];  Service: Pulmonary;;   CATARACT EXTRACTION     bilateral   CHOLECYSTECTOMY N/A 02/10/2017   Procedure: LAPAROSCOPIC CHOLECYSTECTOMY WITH INTRAOPERATIVE CHOLANGIOGRAM;  Surgeon: Alphonsa Overall, MD;  Location: [facility];  Service: General;  Laterality: N/A;   COLONOSCOPY     FIDUCIAL MARKER PLACEMENT  02/16/2021   Procedure: FIDUCIAL MARKER PLACEMENT;  Surgeon: Garner Nash, DO;  Location: [place] [facility];  Service: Pulmonary;;   VIDEO BRONCHOSCOPY WITH ENDOBRONCHIAL NAVIGATION Right 02/16/2021   Procedure: VIDEO BRONCHOSCOPY WITH ENDOBRONCHIAL NAVIGATION;  Surgeon: Garner Nash, DO;  Location: [facility];  Service: Pulmonary;  Laterality: Right;     Social History:   reports that he has been smoking cigarettes. He has a 43.00 pack-year smoking history. He has never used smokeless tobacco. He reports that he does not drink alcohol and does not use drugs.   Family History:  His family history includes Depression in his father; Drug abuse in his father; Liver cancer in his mother.   Allergies No Known Allergies   Home Medications  Prior to Admission medications   Medication Sig Start Date End Date Taking? Authorizing Provider  ALPRAZolam Duanne Moron) 1 MG tablet Take 1 tablet (1 mg total) by mouth 3 (three) times daily as needed for anxiety. 03/26/21   [name], MD  brexpiprazole (REXULTI) 1 MG TABS tablet Take 1 mg by mouth daily.    [provider]  brexpiprazole (REXULTI) 2 MG TABS tablet Take 2 mg by mouth daily.    [provider]  empagliflozin (JARDIANCE) 10 MG  TABS tablet Take 10 mg by mouth daily.    [provider]  Fluticasone-Umeclidin-Vilant (TRELEGY ELLIPTA) 100-62.5-25 MCG/INH AEPB Inhale 1 puff into the lungs daily. Patient taking differently: Inhale 1 puff into the lungs daily as needed (shortness of breath/wheezing). 01/29/21   Icard, Octavio Graves, DO  fluvoxaMINE (LUVOX) 100 MG tablet Take 100 mg by mouth 3 (three) times daily.    [provider]  hydrOXYzine (ATARAX/VISTARIL) 25 MG tablet Take 1 tablet (25 mg total) by mouth every 6 (six) hours. 03/26/21   Lacretia Leigh, MD  icosapent Ethyl (VASCEPA) 1 g capsule Take 2 capsules (2 g total) by mouth  2 (two) times daily. 11/12/20   [name], MD  metFORMIN (GLUCOPHAGE) 500 MG tablet Take by mouth 2 (two) times daily with a meal.    [provider]  QUEtiapine (SEROQUEL) 100 MG tablet Take 300 mg by mouth at bedtime.     [provider]  simvastatin (ZOCOR) 20 MG tablet Take 1 tablet (20 mg total) by mouth at bedtime. 11/12/20   [name], MD  SUMAtriptan (IMITREX) 100 MG tablet Take 100 mg by mouth every 2 (two) hours as needed for migraine. May repeat in 2 hours if headache persists or recurs.    [provider]  SYNJARDY 5-500 MG TABS TAKE 1 TABLET BY MOUTH TWICE DAILY Patient taking differently: Take 1 tablet by mouth 2 (two) times daily. Empagliflozin/metformin 02/12/21   [name], MD  topiramate (TOPAMAX) 25 MG tablet Take 25 mg by mouth daily. 02/22/21   [provider]  Vitamin D, Ergocalciferol, (DRISDOL) 1.25 MG (50000 UNIT) CAPS capsule Take 50,000 Units by mouth once a week. 02/26/21   [provider]     Critical care time: 40 minutes      Baltazar Apo, MD, PhD 03/27/2021, 2:41 PM [facility] [phone] or if no answer before 7:00PM call [phone] For any issues after 7:00PM please call eLink [phone]

## 2021-03-27 NOTE — Progress Notes (Signed)
eLink Physician-Brief Progress Note Patient Name: Jeremy Shannon DOB: 1956-06-12 MRN: [id number]   Date of Service  03/27/2021  HPI/Events of Note  Patient quite sedated and heart rate trending bradycardic on 0.4 mcg of Precedex, bedside RN asking for a lower minimal infusion rate.  eICU Interventions  Precedex infusion parameters changed to 0.1-1.2 mcg.        Kerry Kass Jeovany Huitron 03/27/2021, 10:45 PM

## 2021-03-28 DIAGNOSIS — R410 Disorientation, unspecified: Secondary | ICD-10-CM | POA: Diagnosis not present

## 2021-03-28 LAB — GLUCOSE, CAPILLARY
Glucose-Capillary: 115 mg/dL — ABNORMAL HIGH (ref 70–99)
Glucose-Capillary: 117 mg/dL — ABNORMAL HIGH (ref 70–99)
Glucose-Capillary: 90 mg/dL (ref 70–99)
Glucose-Capillary: 108 mg/dL — ABNORMAL HIGH (ref 70–99)

## 2021-03-28 MED ORDER — LORAZEPAM 1 MG PO TABS
1.0000 mg | ORAL_TABLET | Freq: Once | ORAL | Status: AC
  Administered 2021-03-28: 1 mg via ORAL
  Filled 2021-03-28: qty 1

## 2021-03-28 MED ORDER — LORAZEPAM 2 MG/ML IJ SOLN
1.0000 mg | INTRAMUSCULAR | Status: DC | PRN
  Administered 2021-03-29: 1 mg via INTRAVENOUS
  Filled 2021-03-28 (×2): qty 1

## 2021-03-28 MED ORDER — FLUVOXAMINE MALEATE 100 MG PO TABS
100.0000 mg | ORAL_TABLET | Freq: Three times a day (TID) | ORAL | Status: DC
  Administered 2021-03-28 – 2021-03-29 (×4): 100 mg via ORAL
  Filled 2021-03-28 (×6): qty 1

## 2021-03-28 MED ORDER — BREXPIPRAZOLE 2 MG PO TABS
3.0000 mg | ORAL_TABLET | Freq: Every day | ORAL | Status: DC
  Administered 2021-03-28 – 2021-03-29 (×2): 3 mg via ORAL
  Filled 2021-03-28 (×2): qty 1

## 2021-03-28 NOTE — Evaluation (Signed)
Clinical/Bedside Swallow Evaluation Patient Details  Name: Jeremy Shannon MRN: [id number] Date of Birth: 1956/04/07  Today's Date: 03/28/2021 Time: SLP Start Time (ACUTE ONLY): 31 SLP Stop Time (ACUTE ONLY): 9562 SLP Time Calculation (min) (ACUTE ONLY): 15 min  Past Medical History:  Past Medical History:  Diagnosis Date   Anemia    pt denies   Anxiety    Cataract    Depression    Diabetes mellitus without complication ([place])    type 2   Dyspnea    Hyperlipidemia    PONV (postoperative nausea and vomiting)    Past Surgical History:  Past Surgical History:  Procedure Laterality Date   BRONCHIAL BIOPSY  02/16/2021   Procedure: BRONCHIAL BIOPSIES;  Surgeon: Garner Nash, DO;  Location: [facility];  Service: Pulmonary;;   BRONCHIAL BRUSHINGS  02/16/2021   Procedure: BRONCHIAL BRUSHINGS;  Surgeon: Garner Nash, DO;  Location: [facility];  Service: Pulmonary;;   BRONCHIAL NEEDLE ASPIRATION BIOPSY  02/16/2021   Procedure: BRONCHIAL NEEDLE ASPIRATION BIOPSIES;  Surgeon: Garner Nash, DO;  Location: [facility];  Service: Pulmonary;;   BRONCHIAL WASHINGS  02/16/2021   Procedure: BRONCHIAL WASHINGS;  Surgeon: Garner Nash, DO;  Location: [facility];  Service: Pulmonary;;   CATARACT EXTRACTION     bilateral   CHOLECYSTECTOMY N/A 02/10/2017   Procedure: LAPAROSCOPIC CHOLECYSTECTOMY WITH INTRAOPERATIVE CHOLANGIOGRAM;  Surgeon: Alphonsa Overall, MD;  Location: [facility];  Service: General;  Laterality: N/A;   COLONOSCOPY     FIDUCIAL MARKER PLACEMENT  02/16/2021   Procedure: FIDUCIAL MARKER PLACEMENT;  Surgeon: Garner Nash, DO;  Location: [facility];  Service: Pulmonary;;   VIDEO BRONCHOSCOPY WITH ENDOBRONCHIAL NAVIGATION Right 02/16/2021   Procedure: VIDEO BRONCHOSCOPY WITH ENDOBRONCHIAL NAVIGATION;  Surgeon: Garner Nash, DO;  Location: [facility];  Service: Pulmonary;  Laterality: Right;   HPI:  Patient is a 65 y.o. 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 Wife reported to MD that patient is "completely unorganized with his medication and does not listen to her about using pill box.  She showed me photos of bottles scattered about his nightstand with some pills poured out. She is unsure if he overdosed on any of his medications. She brought in his empty Xanax bottle." CT head negative, COVID and flu PCR negative, EKG showed tremor artifact but no significant ST or T wave changes. Patient admitted with AMS from suspected benzo withdrawal.   Assessment / Plan / Recommendation Clinical Impression  Patient presents with a mild oropharyngeal dysphagia with suspected impact from patient's current state of lethargy. He exhibited immediate cough response with initial sip of thin liquids via straw sip, however subsequent swallows did not result in any overt s/s aspiration or penetration, swallow initiation appeared timely and patient's voice and vitals all remained WFL. Patient consumed regular solids without significant difficulty. SLP is recommending to upgrade patient's solid textures from Dys 1 (puree) to regular and continue with thin liquids. SLP Visit Diagnosis: Dysphagia, unspecified (R13.10)    Aspiration Risk  Mild aspiration risk    Diet Recommendation Regular;Thin liquid   Liquid Administration via: Cup;Straw Medication Administration: Whole meds with liquid Supervision: Patient able to self feed;Intermittent supervision to cue for compensatory strategies Compensations: Slow rate;Small sips/bites Postural Changes: Seated upright at 90 degrees    Other  Recommendations Oral Care Recommendations: Oral care BID   Follow up Recommendations None  Frequency and Duration min 1 x/week  1 week       Prognosis Prognosis for Safe Diet Advancement: Good      Swallow Study   General Date of Onset:  03/26/21 HPI: Patient is a 65 y.o. male with PMH: DM-2 with neuropathy, possible parkinsonian syndrome, anxiety, depression, lung mass, and HLD who presented with concerns of increasing anxiety, tremor and hallucinations. Wife reported to MD that patient is "completely unorganized with his medication and does not listen to her about using pill box.  She showed me photos of bottles scattered about his nightstand with some pills poured out. She is unsure if he overdosed on any of his medications. She brought in his empty Xanax bottle." CT head negative, COVID and flu PCR negative, EKG showed tremor artifact but no significant ST or T wave changes. Patient admitted with AMS from suspected benzo withdrawal. Type of Study: Bedside Swallow Evaluation Previous Swallow Assessment: None found Diet Prior to this Study: Dysphagia 1 (puree);Thin liquids Temperature Spikes Noted: No Respiratory Status: Room air History of Recent Intubation: No Behavior/Cognition: Alert;Cooperative;Lethargic/Drowsy;Pleasant mood Oral Cavity Assessment: Within Functional Limits Oral Care Completed by SLP: No Oral Cavity - Dentition: Adequate natural dentition Vision: Functional for self-feeding Self-Feeding Abilities: Able to feed self;Needs set up Patient Positioning: Upright in bed Baseline Vocal Quality: Low vocal intensity;Normal Volitional Cough: Cognitively unable to elicit Volitional Swallow: Unable to elicit    Oral/Motor/Sensory Function Overall Oral Motor/Sensory Function: Within functional limits   Ice Chips     Thin Liquid Thin Liquid: Impaired Presentation: Straw Oral Phase Impairments: Poor awareness of bolus Pharyngeal  Phase Impairments: Other (comments) Other Comments: Patient had immediate cough response first sip of thin liquids via straw however subsequent, consecutive straw sips thin liquids did not result in any overt s/s aspiration or penetration.    Nectar Thick     Honey Thick     Puree Puree:  Not tested   Solid     Solid: Within functional limits     Sonia Baller, MA, [facility]

## 2021-03-28 NOTE — Consult Note (Signed)
NAME:  Jeremy Shannon, MRN:  [id number], DOB:  28-Mar-1956, LOS: 1 ADMISSION DATE:  03/26/2021, CONSULTATION DATE: 03/27/2021 REFERRING MD: Dr. Tawanna Solo, CHIEF COMPLAINT: Agitated delirium  Patient Summary: 65 yo man with hx chronic xanax use.  Admitted with benzo-withdrawal 7/15. To ICU 7/16 for precedex.   History of Present Illness:  65 year old man, smoker (70 pk-yrs) with diabetes/neuropathy, question Parkinson syndrome, hyperlipidemia, severe anxiety and depression.  Known to our service for recent evaluation of right lower lobe pulmonary nodule. Brought to the emergency department 03/26/2021 with anxiety, tremor, hallucinations and delirium.  He has chronically used Xanax for decades, currently 1 mg 4 times daily.  He ran out 4 days prior to admission, question whether he has been dosing higher than 4 times daily.  In the emergency department he was confabulating, hallucinating, tremulous and agitated, tachycardic and hypertensive.  Head CT reassuring.  He was admitted for presumed benzodiazepine withdrawal and started on CIWA protocol with symptom triggered Ativan. On 7/16 he has experienced progressive agitated delirium, only partially responsive to bolused Ativan.  He has had 11 mg total, 6 mg in the last 6 hours.  CIWA scores remain above 25.  PCCM consulted to consider Precedex infusion   Pertinent  Medical History   Past Medical History:  Diagnosis Date   Anemia    pt denies   Anxiety    Cataract    Depression    Diabetes mellitus without complication ([facility])    type 2   Dyspnea    Hyperlipidemia    PONV (postoperative nausea and vomiting)     Significant Hospital Events: Including procedures, antibiotic start and stop dates in addition to other pertinent events   Admitted with agitated delirium 7/15 Head CT 7/15 showed no acute abnormality MRI brain 7/3 mild to moderate generalized cortical atrophy pronounced in the frontal and parietal convexities, age-appropriate  chronic microvascular ischemic changes, no other acute findings PET scan 02/10/2021 > right lower lobe mass without definitive hypermetabolism, decreased in size to 3.1 x 1.5 cm (from 3.4 x 2.0 cm by CT 5/26) Pulmonary function testing 02/17/2021 >> grossly normal airflows without a bronchodilator response, slight restriction of his lung volumes, decreased diffusion capacity that corrects to the normal range when adjusted for alveolar volume 7/17 Weaned off Precedex   Interim History / Subjective:  Agitated overnight requiring Precedex Calm this morning and eating breakfast. Still on Precedex  Objective   Blood pressure 121/86, pulse 67, temperature 97.7 F (36.5 C), temperature source Axillary, resp. rate 15, height 5\' 8"  (1.727 m), weight 108 kg, SpO2 98 %.        Intake/Output Summary (Last 24 hours) at 03/28/2021 1534 Last data filed at 03/28/2021 1300 Gross per 24 hour  Intake 534.4 ml  Output 875 ml  Net -340.6 ml   Filed Weights   03/26/21 1618  Weight: 108 kg   Physical Exam: General: Well-appearing, no acute distress HENT: [place], AT, OP clear, MMM Eyes: EOMI, no scleral icterus Respiratory: Clear to auscultation bilaterally.  No crackles, wheezing or rales Cardiovascular: RRR, -M/R/G, no JVD GI: BS+, soft, nontender Extremities:-Edema,-tenderness Neuro: AAO x4, CNII-XII grossly intact   Resolved Hospital Problem list     Assessment & Plan:   Benzodiazepine withdrawal with agitated delirium.  Inadequately managed with bolused Ativan based on CIWA scoring. -Wean Precedex off -Restart home brexpiprazole, fluvoxamine -PRN IV ativan for agitation -Ativan if needed for breakthrough agitation -Will need to determine his appropriate outpatient Xanax dosing, whether he has  been overusing, who will be reliably prescribing.  Will address once he is stabilized from acute events.  Anxiety / Depression -on brexpiprazole as an outpt, presumed for his depression (also an  anti-psychotic). Also fluvoxamine, high dose seroquel.  There can be withdrawal with any of these, including agitation.  -Restart home brexpiprazole, fluvoxamine -Continue seroquel nightly. Consider discontinuing prior to discharge  Hypertension, not on outpt meds. Likely due to his agitation and withdrawal -hold off on anti-HTN meds for now, treat underlying process  Parkinsonian symptoms, gait disturbance -reassuring outpatient MRI brain -? Contribution of his anti-depressant regimen which can cause such side effects -? Contribution of his peripheral neuropathy to his gait disturbance  Hyperlipidemia -simvastatin and vascepa on hold  Diabetes mellitus -holding metformin and synjardy for now -SSI and CBG per protocol  Hx tobacco use without significant obstruction on PFT  -nicotine patch -no clear role for BD  RLL nodule -bronchoscopy done 6/7 with negative cytology, and PET scan showed slight decrease in size >> all reassuring.  -outpt re-imaging as planned.   Best Practice (right click and "Reselect all SmartList Selections" daily)   Diet/type: dysphagia diet (see orders) DVT prophylaxis: LMWH GI prophylaxis: H2B Lines: N/A Foley:  N/A Code Status:  full code Last date of multidisciplinary goals of care discussion [pending] Family: Updated patient on 7/17 Transfer to [facility] 7/18  Labs   CBC: Recent Labs  Lab 03/26/21 1715  WBC 8.4  NEUTROABS 6.4  HGB 15.6  HCT 46.2  MCV 92.4  PLT 161    Basic Metabolic Panel: Recent Labs  Lab 03/26/21 1715 03/27/21 0220  NA 138 136  K 4.2 3.9  CL 107 108  CO2 22 18*  GLUCOSE 118* 101*  BUN 10 11  CREATININE 1.02 0.95  CALCIUM 9.1 8.5*   GFR: Estimated Creatinine Clearance: 89.9 mL/min (by C-G formula based on SCr of 0.95 mg/dL). Recent Labs  Lab 03/26/21 1715  WBC 8.4    Liver Function Tests: Recent Labs  Lab 03/26/21 1715 03/27/21 0220  AST 15 18  ALT 20 18  ALKPHOS 58 51  BILITOT 0.5 0.5  PROT 6.9  6.1*  ALBUMIN 4.1 3.5   Recent Labs  Lab 03/26/21 1715  LIPASE 30   Recent Labs  Lab 03/26/21 1715  AMMONIA 16    ABG    Component Value Date/Time   TCO2 27 10/22/2009 1257     Coagulation Profile: No results for input(s): INR, PROTIME in the last 168 hours.  Cardiac Enzymes: No results for input(s): CKTOTAL, CKMB, CKMBINDEX, TROPONINI in the last 168 hours.  HbA1C: Hemoglobin A1C  Date/Time Value Ref Range Status  11/11/2020 11:28 AM 7.2 (A) 4.0 - 5.6 % Final   Hgb A1c MFr Bld  Date/Time Value Ref Range Status  02/08/2017 10:00 AM 6.0 (H) 4.8 - 5.6 % Final    Comment:    (NOTE)         Pre-diabetes: 5.7 - 6.4         Diabetes: >6.4         Glycemic control for adults with diabetes: <7.0     CBG: Recent Labs  Lab 03/27/21 1921 03/27/21 2346 03/28/21 0553 03/28/21 1139 03/28/21 1524  GLUCAP 117* 121* 115* 117* 90    Critical care time: 35 minutes    The patient is critically ill with agitation requiring sedation.  Rodman Pickle, M.D. [facility] Pulmonary/Critical Care Medicine 03/28/2021 3:34 PM   Please see Amion for pager number to reach  on-call Pulmonary and Critical Care Team.

## 2021-03-28 NOTE — Progress Notes (Signed)
eLink Physician-Brief Progress Note Patient Name: ARLISS FRISINA DOB: [birth year] MRN: [id number]   Date of Service  03/28/2021  HPI/Events of Note  Anxiety - Currently on Ativan IV, however, IV access has been lost.  eICU Interventions  Plan: Ativan 1 mg PO X 1 now.     Intervention Category Major Interventions: Delirium, psychosis, severe agitation - evaluation and management  Blue Winther Eugene 03/28/2021, 7:57 PM

## 2021-03-28 NOTE — Progress Notes (Signed)
eLink Physician-Brief Progress Note Patient Name: Jeremy Shannon DOB: 19-Nov-1955 MRN: [id number]   Date of Service  03/28/2021  HPI/Events of Note  Patient with acute urinary retention (bladder scan positive for 544 ml of urine), in / out bladder catheters unavailable.  eICU Interventions  Foley catheter ordered.        Kerry Kass [name] 03/28/2021, 12:50 AM

## 2021-03-29 ENCOUNTER — Other Ambulatory Visit (HOSPITAL_COMMUNITY): Payer: Self-pay

## 2021-03-29 DIAGNOSIS — F13230 Sedative, hypnotic or anxiolytic dependence with withdrawal, uncomplicated: Secondary | ICD-10-CM

## 2021-03-29 DIAGNOSIS — F13930 Sedative, hypnotic or anxiolytic use, unspecified with withdrawal, uncomplicated: Secondary | ICD-10-CM

## 2021-03-29 LAB — GLUCOSE, CAPILLARY
Glucose-Capillary: 110 mg/dL — ABNORMAL HIGH (ref 70–99)
Glucose-Capillary: 116 mg/dL — ABNORMAL HIGH (ref 70–99)
Glucose-Capillary: 104 mg/dL — ABNORMAL HIGH (ref 70–99)
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
## 2021-03-29 NOTE — Progress Notes (Signed)
  Speech Language Pathology Treatment: Dysphagia  Patient Details Name: Jeremy Shannon MRN: [id number] DOB: [birth year] Today's Date: 03/29/2021 Time: 4585-9292 SLP Time Calculation (min) (ACUTE ONLY): 8 min  Assessment / Plan / Recommendation Clinical Impression  Pt seen for diet tolerance. Per RN, pt without any overt difficulty with POs and seems to be tolerating regular diet. Pt denies any dysphagia difficulties. Assessed with thin liquids including 3 oz water challenge. No overt s/sx of aspiration. Pt declined PO snack, per RN limited low sugar nourishment room options. Continue regular thin liquid diet. No further ST needs identified.    HPI HPI: Patient is a 65 y.o. male with PMH: DM-2 with neuropathy, possible parkinsonian syndrome, anxiety, depression, lung mass, and HLD who presented with concerns of increasing anxiety, tremor and hallucinations. Wife reported to MD that patient is "completely unorganized with his medication and does not listen to her about using pill box.  She showed me photos of bottles scattered about his nightstand with some pills poured out. She is unsure if he overdosed on any of his medications. She brought in his empty Xanax bottle." CT head negative, COVID and flu PCR negative, EKG showed tremor artifact but no significant ST or T wave changes. Patient admitted with AMS from suspected benzo withdrawal.      SLP Plan  All goals met;Discharge SLP treatment due to (comment)       Recommendations  Diet recommendations: Regular;Thin liquid Liquids provided via: Cup;Straw Medication Administration: Whole meds with liquid Supervision: Patient able to self feed Compensations: Slow rate;Small sips/bites Postural Changes and/or Swallow Maneuvers: Seated upright 90 degrees;Upright 30-60 min after meal                Oral Care Recommendations: Oral care BID Follow up Recommendations: None SLP Visit Diagnosis: Dysphagia, unspecified (R13.10) Plan: All goals  met;Discharge SLP treatment due to (comment)       [place], CCC-SLP Acute Rehabilitation Services   03/29/2021, 10:55 AM
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
## 2021-04-06 LAB — ACID FAST CULTURE WITH REFLEXED SENSITIVITIES (MYCOBACTERIA)
Acid Fast Culture: NEGATIVE
Acid Fast Culture: NEGATIVE

## 2021-04-27 ENCOUNTER — Ambulatory Visit (HOSPITAL_COMMUNITY): Admission: RE | Admit: 2021-04-27 | Payer: Medicare Other | Source: Ambulatory Visit

## 2021-04-27 ENCOUNTER — Encounter (HOSPITAL_COMMUNITY): Payer: Medicare Other

## 2021-04-27 ENCOUNTER — Encounter: Payer: Self-pay | Admitting: Dietician

## 2021-04-27 ENCOUNTER — Other Ambulatory Visit: Payer: Self-pay

## 2021-04-27 ENCOUNTER — Encounter: Payer: Medicare Other | Attending: Internal Medicine | Admitting: Dietician

## 2021-04-27 DIAGNOSIS — E119 Type 2 diabetes mellitus without complications: Secondary | ICD-10-CM | POA: Insufficient documentation

## 2021-04-27 DIAGNOSIS — E118 Type 2 diabetes mellitus with unspecified complications: Secondary | ICD-10-CM | POA: Diagnosis not present

## 2021-04-27 DIAGNOSIS — Z79891 Long term (current) use of opiate analgesic: Secondary | ICD-10-CM | POA: Diagnosis not present

## 2021-04-27 NOTE — Progress Notes (Signed)
Diabetes Self-Management Education  Visit Type: Follow-up  Appt. Start Time: 1415 Appt. End Time: [id number]  04/27/2021  [name], identified by name and date of birth, is a 65 y.o. male with a diagnosis of Diabetes:  .   ASSESSMENT Pt reports losing 6 pounds, has a weight loss goal of 20 pounds. Pt states they are trying to eat the right stuff like salads, grilled chicken. Pt reports checking FBG every morning, states it was 118 this morning but usually runs between 100-130. Pt is taking Synjardy and Jardiance for diabetes, adherent to prescribed dosages. Pt reports being impulsive with eating, especially when driving by restaurants, usually 3 times a week. Pt states they do not eat impulsively when they are at home. Pt reports their wife usually prepares healthy dinners most nights. [facility] 60-75 minutes, riding stationary bike and doing some resistance training. Pt is still caring for daughter, states she is doing much better and that is helping their emotional state. Pt seems motivated to continue making changes.  Height '5\' 9"'$  (1.753 m), weight 238 lb (108 kg). Body mass index is 35.15 kg/m.   Diabetes Self-Management Education - 04/27/21 1415       Visit Information   Visit Type Follow-up      Complications   How often do you check your blood sugar? 1-2 times/day    Fasting Blood glucose range (mg/dL) 70-129      Dietary Intake   Breakfast 1 egg, 2 pieces of bacon    Lunch [employer] Sandwich, iced tea    Snack (afternoon) [employer] Grilled chicken sandwich without bread    Dinner Salad, rigatoni    Snack (evening) Bowl of baked beans    Beverage(s) iced tea, water      Exercise   Exercise Type ADL's;Moderate (swimming / aerobic walking)    How many days per week to you exercise? 3    How many minutes per day do you exercise? 60    Total minutes per week of exercise 180             Individualized Plan for Diabetes  Self-Management Training:   Learning Objective:  Patient will have a greater understanding of diabetes self-management. Patient education plan is to attend individual and/or group sessions per assessed needs and concerns.   Plan:   Patient Instructions  Take a snack with you when you leave to run errands to help prevent impulsive stops to fast food restaurants. Take a granola bar, protein bar, protein shake, and an orange or peach as a snack.  Great job on getting back to the Y! Keep up the hard work exercising.  Drink at least 4 bottles of water each day, especially when you are exercising.  Keep checking your blood sugar each morning. Continue to look for numbers under 125, and to get closer to 100 consistently.  Contact Dr. Scarlette Calico to get an appointment to have your A1c checked.  Expected Outcomes:  Expect positive outcomes   If problems or questions, patient to contact team via:  Phone and Email  Future DSME appointment:

## 2021-04-27 NOTE — Patient Instructions (Addendum)
Take a snack with you when you leave to run errands to help prevent impulsive stops to fast food restaurants. Take a granola bar, protein bar, protein shake, and an orange or peach as a snack.  Great job on getting [facility] to the [facility]! Keep up the hard work exercising.  Drink at least 4 bottles of water each day, especially when you are exercising.  Keep checking your blood sugar each morning. Continue to look for numbers under 125, and to get closer to 100 consistently.  Contact Dr. Scarlette Calico to get an appointment to have your A1c checked.

## 2021-04-28 DIAGNOSIS — Z789 Other specified health status: Secondary | ICD-10-CM | POA: Diagnosis not present

## 2021-04-28 DIAGNOSIS — F32A Depression, unspecified: Secondary | ICD-10-CM | POA: Diagnosis not present

## 2021-04-28 DIAGNOSIS — E785 Hyperlipidemia, unspecified: Secondary | ICD-10-CM | POA: Diagnosis not present

## 2021-05-04 NOTE — Telephone Encounter (Signed)
Patient did not get DaTscan done in insurance authorization window. The PA expired 8/11. He is now scheduled for DaTscan 9/6. I requested extension of authorization via [place], but  they advised that insurance is not able to extend or withdraw the request, they are requesting a peer-to-peer. This can be done by calling [phone], select option #1, and enter reference [id number].

## 2021-05-11 NOTE — Telephone Encounter (Addendum)
I called [employer] to start appeal, I was advised to call [phone]. I called and spoke with Geni Bers to start appeal. She advised me to fax clinicals to [phone]. Appeal case [id number] [place]. Geni Bers states there is a 30 day turnaround time.

## 2021-05-18 ENCOUNTER — Other Ambulatory Visit (HOSPITAL_COMMUNITY): Payer: Medicare Other

## 2021-05-18 ENCOUNTER — Ambulatory Visit (HOSPITAL_COMMUNITY): Admission: RE | Admit: 2021-05-18 | Payer: Medicare Other | Source: Ambulatory Visit

## 2021-05-18 ENCOUNTER — Other Ambulatory Visit: Payer: Self-pay | Admitting: Internal Medicine

## 2021-05-18 DIAGNOSIS — E118 Type 2 diabetes mellitus with unspecified complications: Secondary | ICD-10-CM

## 2021-05-24 DIAGNOSIS — Z79891 Long term (current) use of opiate analgesic: Secondary | ICD-10-CM | POA: Diagnosis not present

## 2021-06-04 DIAGNOSIS — E1165 Type 2 diabetes mellitus with hyperglycemia: Secondary | ICD-10-CM | POA: Diagnosis not present

## 2021-06-04 DIAGNOSIS — Z789 Other specified health status: Secondary | ICD-10-CM | POA: Diagnosis not present

## 2021-06-04 DIAGNOSIS — E785 Hyperlipidemia, unspecified: Secondary | ICD-10-CM | POA: Diagnosis not present

## 2021-06-07 DIAGNOSIS — Z79891 Long term (current) use of opiate analgesic: Secondary | ICD-10-CM | POA: Diagnosis not present

## 2021-06-08 NOTE — Telephone Encounter (Signed)
I called [employer] to check status of appeal. Advised it was overturned and approved. Josem Kaufmann #[id number] (05/17/21- 07/01/21). Sent DaTscan order back over to [facility] for scheduling. Phone: [phone].

## 2021-06-21 ENCOUNTER — Ambulatory Visit: Payer: Medicare Other

## 2021-06-28 ENCOUNTER — Telehealth: Payer: Self-pay | Admitting: Neurology

## 2021-06-28 NOTE — Telephone Encounter (Signed)
I called and LMVM for pt that [name] with [facility] has called to setup [facility] for you.  I left [phone] to call.  Will mychart as well-done.

## 2021-06-28 NOTE — Telephone Encounter (Signed)
[name] @ [facility] has called to report he has tried calling pt on 10-06,10-13 & 10-14 even left messages and has yet to be able to make contact with pt.  This is Pharmacist, hospital

## 2021-06-29 ENCOUNTER — Telehealth: Payer: Self-pay | Admitting: *Deleted

## 2021-06-29 NOTE — Telephone Encounter (Signed)
Wife calling for pt.  Questioning whether pt needs to have DATSCAN, he is now better since was seen in ED, and felt like due to the medications he was on.  See 03-26-21 ED note in Epic.  Pt is not dragging his feet, moving around better nowm is slow but is going to gym.  Did have a fall due to loss of balance. She felt like the pt needed to be see by [place] prior to having DATSCAN of would even need DATSCAN.  Please advise.

## 2021-06-30 NOTE — Telephone Encounter (Signed)
I called wife of pt.  I relayed Dr. Cathren Laine message if he was doing better, no datscan is needed. Monitor and this can be readdressed at a later time if needed.  Wife requested a f/u appt 09-09-21 at 1530 made.

## 2021-07-01 IMAGING — CT CT CHEST LUNG CANCER SCREENING LOW DOSE W/O CM
1 of 2 series · 10 of 20 positions shown, 13 images · non-contrast
Comparison: None.

CLINICAL DATA: Lung cancer screening. Thirty pack-year history.
Current asymptomatic smoker.

EXAM:
CT CHEST WITHOUT CONTRAST LOW-DOSE FOR LUNG CANCER SCREENING
TECHNIQUE: Multidetector CT imaging of the chest was performed following the
standard protocol without IV contrast.

[ct lung segmentation data · axial · 0.77mm/px · z∈[-152,-152]mm · 10 of 336 frames shown]
[frame 1/336  mediastinal]
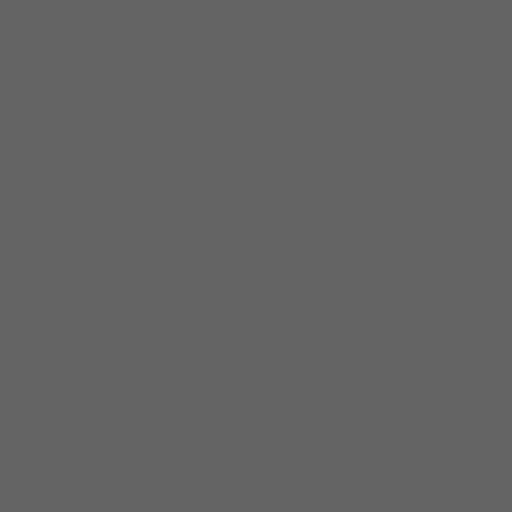
[frame 1/336  lung]
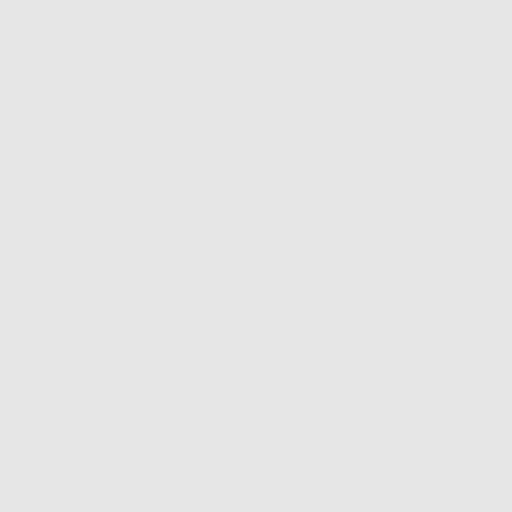
[frame 38/336  lung]
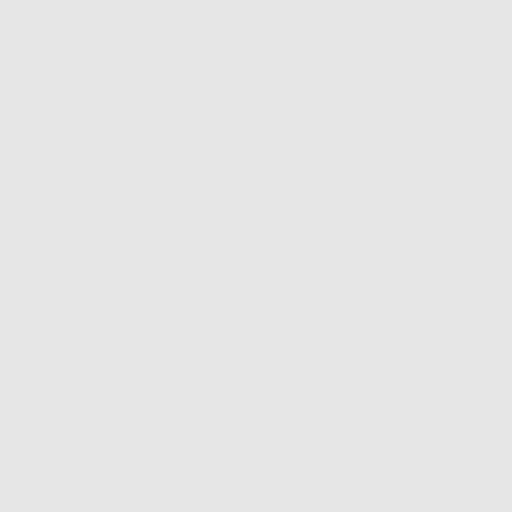
[frame 75/336  lung]
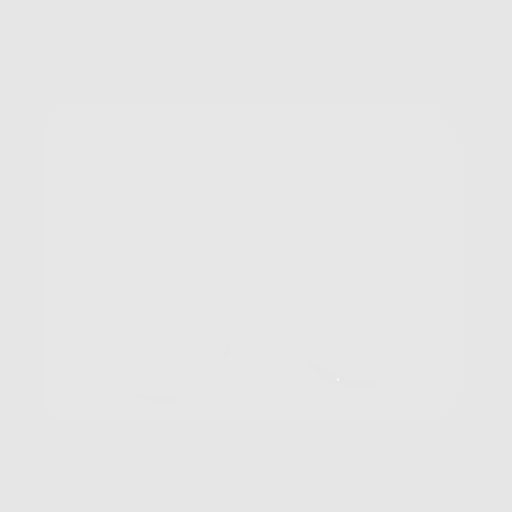
[frame 112/336  lung]
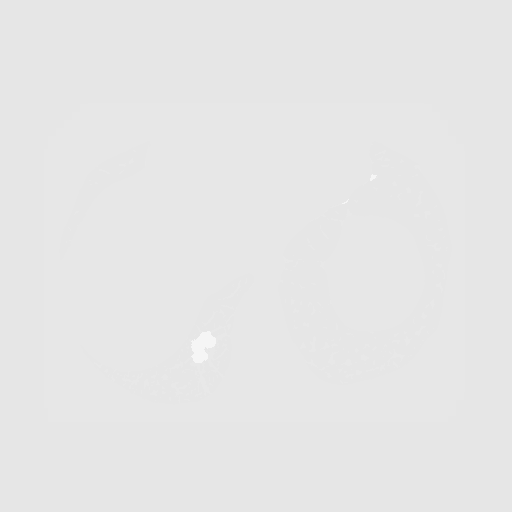
[frame 149/336  mediastinal]
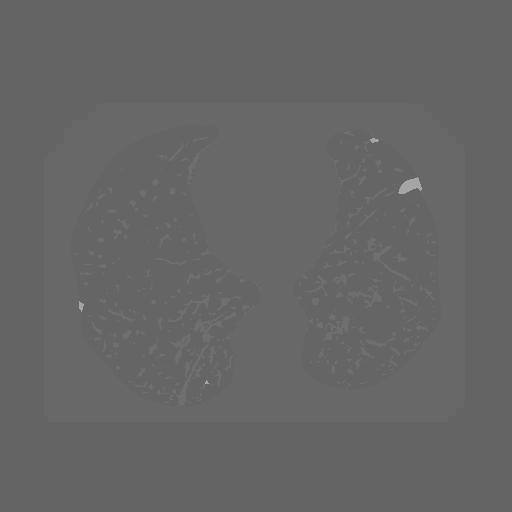
[frame 149/336  lung]
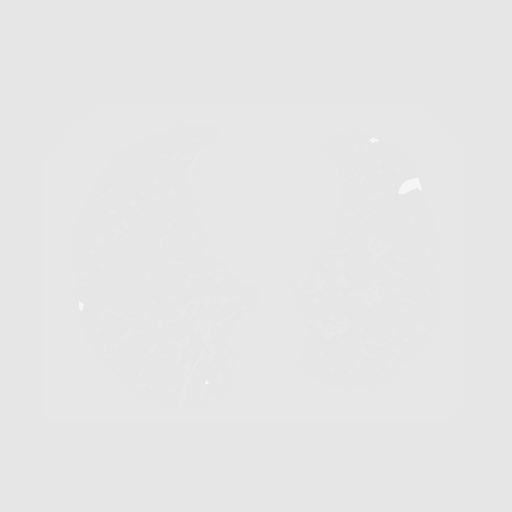
[frame 187/336  lung]
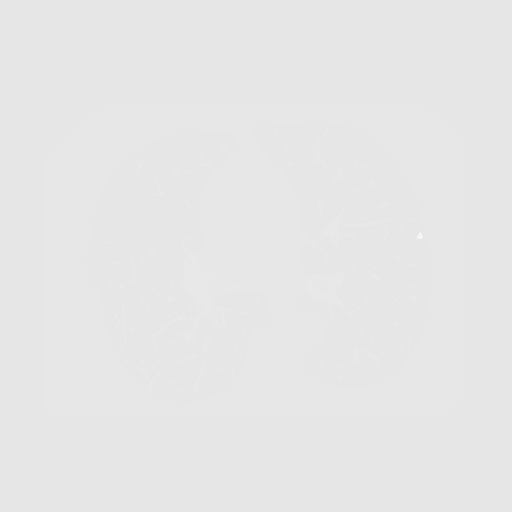
[frame 224/336  lung]
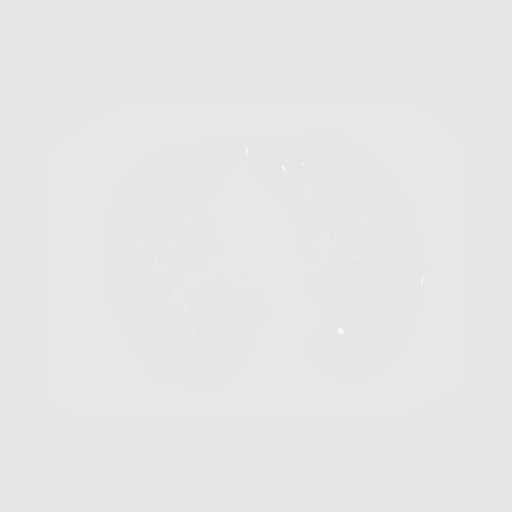
[frame 261/336  lung]
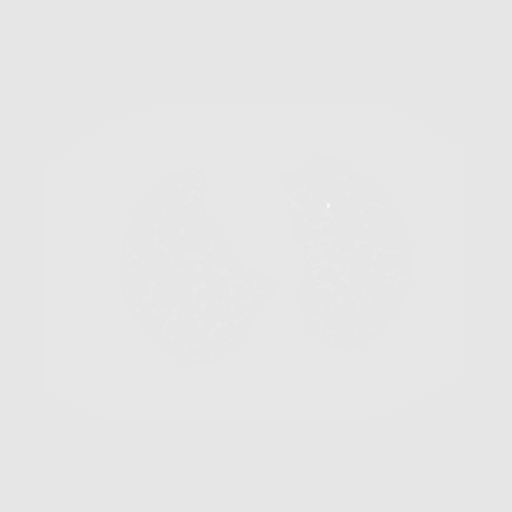
[frame 298/336  mediastinal]
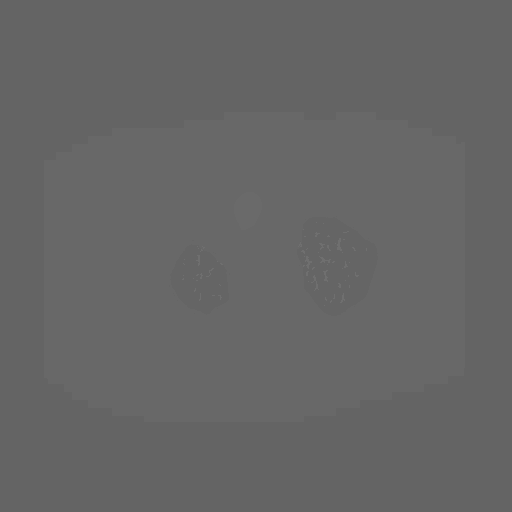
[frame 298/336  lung]
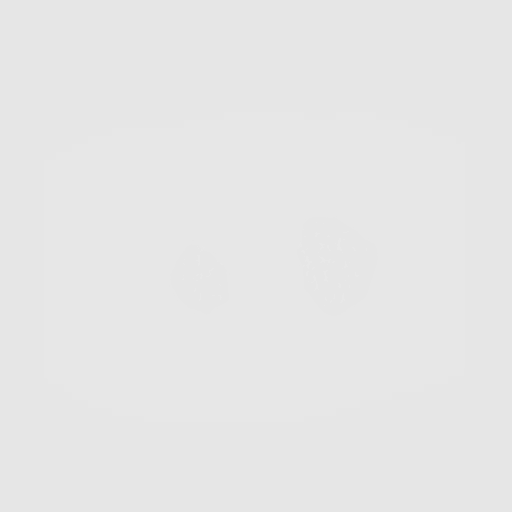
[frame 336/336  lung]
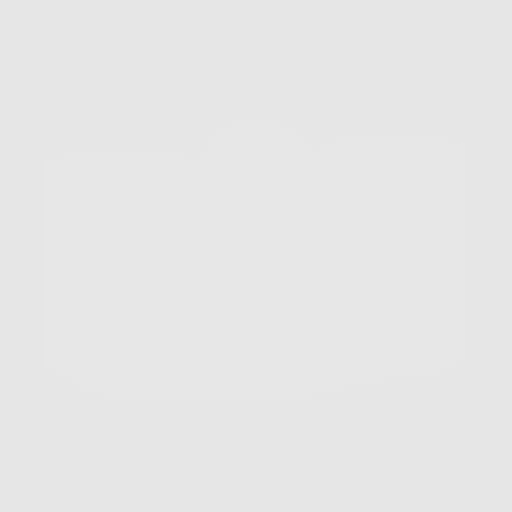

[10 of 20 positions shown; findings below may reference images not displayed]

FINDINGS: Cardiovascular: Heart size appears within normal limits. Aortic
atherosclerosis. No pericardial effusion. Coronary artery
calcifications.

Mediastinum/Nodes: No enlarged mediastinal, hilar, or axillary lymph
nodes. Thyroid gland, trachea, and esophagus demonstrate no
significant findings.

Lungs/Pleura: Centrilobular emphysema. No pleural effusion. Within
the posterior right lung base there is an area of masslike
architectural distortion which has a mean derived diameter of
mm, image 241/3. This was also not confidently identified on the
chest radiograph from 11/11/2020, which suggests this may be
inflammatory or infectious. However, malignancy cannot be not
excluded.

Upper Abdomen: No acute abnormality. Diffuse hepatic steatosis
identified. Previous cholecystectomy.

Musculoskeletal: No chest wall mass or suspicious bone lesions
identified.
IMPRESSION: 1. Lung-RADS 4B, suspicious. Additional imaging evaluation or
consultation with Pulmonology or Thoracic Surgery recommended.
2. Coronary artery calcifications.
3. Hepatic steatosis.

Aortic Atherosclerosis (A7UMC-L0O.O) and Emphysema (A7UMC-W3M.S).

These results will be called to the ordering clinician or
representative by the Radiologist Assistant, and communication
documented in the PACS or [REDACTED].

## 2021-07-13 ENCOUNTER — Other Ambulatory Visit (HOSPITAL_BASED_OUTPATIENT_CLINIC_OR_DEPARTMENT_OTHER): Payer: Self-pay

## 2021-07-13 ENCOUNTER — Ambulatory Visit: Payer: Medicare Other | Attending: Internal Medicine

## 2021-07-13 DIAGNOSIS — Z23 Encounter for immunization: Secondary | ICD-10-CM

## 2021-07-13 MED ORDER — INFLUENZA VAC A&B SA ADJ QUAD 0.5 ML IM PRSY
PREFILLED_SYRINGE | INTRAMUSCULAR | 0 refills | Status: DC
  Filled 2021-07-13: qty 0.5, 1d supply, fill #0

## 2021-07-13 MED ORDER — PFIZER COVID-19 VAC BIVALENT 30 MCG/0.3ML IM SUSP
INTRAMUSCULAR | 0 refills | Status: DC
  Filled 2021-07-13: qty 0.3, 1d supply, fill #0

## 2021-07-13 NOTE — Progress Notes (Signed)
   Covid-19 Vaccination Clinic  Name:  Jeremy Shannon    MRN: [id number] DOB: 05-19-1956  07/13/2021  Mr. [name] was observed post Covid-19 immunization for 15 minutes without incident. He was provided with Vaccine Information Sheet and instruction to access the V-Safe system.   Mr. [name] was instructed to call 911 with any severe reactions post vaccine: Difficulty breathing  Swelling of face and throat  A fast heartbeat  A bad rash all over body  Dizziness and weakness   Immunizations Administered     Name Date Dose VIS Date Route   Pfizer Covid-19 Vaccine Bivalent Booster 07/13/2021 11:11 AM 0.3 mL 05/12/2021 Intramuscular   Manufacturer: [place]   Lot: [id number]   [place]: [phone]

## 2021-08-19 ENCOUNTER — Other Ambulatory Visit: Payer: Self-pay | Admitting: Internal Medicine

## 2021-08-19 DIAGNOSIS — E118 Type 2 diabetes mellitus with unspecified complications: Secondary | ICD-10-CM

## 2021-08-26 DIAGNOSIS — Z79891 Long term (current) use of opiate analgesic: Secondary | ICD-10-CM | POA: Diagnosis not present

## 2021-09-09 ENCOUNTER — Ambulatory Visit (INDEPENDENT_AMBULATORY_CARE_PROVIDER_SITE_OTHER): Payer: Medicare Other | Admitting: Neurology

## 2021-09-09 DIAGNOSIS — R269 Unspecified abnormalities of gait and mobility: Secondary | ICD-10-CM

## 2021-09-09 DIAGNOSIS — G20 Parkinson's disease: Secondary | ICD-10-CM

## 2021-09-09 DIAGNOSIS — G20C Parkinsonism, unspecified: Secondary | ICD-10-CM

## 2021-09-09 NOTE — Progress Notes (Addendum)
(  09/09/2021: I spoke with wife. He is still bradykinetic and parkinsonian. We got his DAT Scan approved but he never had it completed and not sure they want to. Will refer to physical therapy and get second opinion at [facility] movement disorder per patient request she is not sure she wants the DAT but I do think he is parkinsonian, on seroquel may be PD or medication related.)  MRI Brain: following: 1.   Mild to moderate generalized cortical atrophy, most pronounced in the frontal and parietal convexities and progressed compared to the 2007 CT scan. 2.   Just a couple T2/FLAIR hyperintense foci in the hemispheres consistent with minimal age-appropriate chronic microvascular ischemic changes. 3.   No acute findings. 4.   Normal enhancement pattern.  MRI Cervical spine: IMPRESSION: This MRI of the cervical spine without contrast shows the following: 1.   The spinal cord appears normal. 2.   At C3-C4, degenerative changes cause borderline spinal stenosis and foraminal narrowing.  There does not appear to be nerve root compression. 3.   At C4-C5, degenerative changes cause mild spinal stenosis and moderately severe left and moderate right foraminal narrowing.  There is potential for left C5 nerve root compression. 4.   At C5-C6, degenerative changes: This moderate spinal stenosis and moderately severe right and moderate left foraminal narrowing.  There is potential for right C6 nerve root compression. 5.   Milder degenerative changes at C6-C7 and C2-C3 do not lead to spinal stenosis or nerve root compression. )

## 2021-09-15 ENCOUNTER — Telehealth: Payer: Self-pay | Admitting: Neurology

## 2021-09-15 ENCOUNTER — Ambulatory Visit: Payer: Medicare Other | Admitting: Physical Therapy

## 2021-09-15 NOTE — Telephone Encounter (Signed)
Referral has been sent to [facility], requesting movement disorder specialist. Phone: [phone].

## 2021-09-16 ENCOUNTER — Other Ambulatory Visit: Payer: Self-pay

## 2021-09-16 ENCOUNTER — Ambulatory Visit: Payer: Medicare Other | Attending: Neurology | Admitting: Physical Therapy

## 2021-09-16 DIAGNOSIS — R2689 Other abnormalities of gait and mobility: Secondary | ICD-10-CM | POA: Insufficient documentation

## 2021-09-16 DIAGNOSIS — R269 Unspecified abnormalities of gait and mobility: Secondary | ICD-10-CM | POA: Insufficient documentation

## 2021-09-16 DIAGNOSIS — G20 Parkinson's disease: Secondary | ICD-10-CM | POA: Insufficient documentation

## 2021-09-16 DIAGNOSIS — R2681 Unsteadiness on feet: Secondary | ICD-10-CM | POA: Insufficient documentation

## 2021-09-16 NOTE — Therapy (Signed)
[facility] [address] [place], [place], [address] Phone: [phone]   Fax:  [phone]  Physical Therapy Evaluation  Patient Details  Name: Jeremy Shannon MRN: [id number] Date of Birth: 17-Jan-1956 Referring Provider (PT): Jaynee Eagles   Encounter Date: 09/16/2021   PT End of Session - 09/16/21 1240     Visit Number 1    Number of Visits 9    Date for PT Re-Evaluation 10/15/21    Authorization Type UHC Medicare    Progress Note Due on Visit 10    PT Start Time 1150    PT Stop Time 1233    PT Time Calculation (min) 43 min    Activity Tolerance Patient tolerated treatment well    Behavior During Therapy Flat affect;Restless   Reports several times that he is fine and doesn't need to be here.            Past Medical History:  Diagnosis Date   Anemia    pt denies   Anxiety    Cataract    Depression    Diabetes mellitus without complication (HCC)    type 2   Dyspnea    Hyperlipidemia    PONV (postoperative nausea and vomiting)     Past Surgical History:  Procedure Laterality Date   BRONCHIAL BIOPSY  02/16/2021   Procedure: BRONCHIAL BIOPSIES;  Surgeon: Garner Nash, DO;  Location: [facility];  Service: Pulmonary;;   BRONCHIAL BRUSHINGS  02/16/2021   Procedure: BRONCHIAL BRUSHINGS;  Surgeon: Garner Nash, DO;  Location: [facility];  Service: Pulmonary;;   BRONCHIAL NEEDLE ASPIRATION BIOPSY  02/16/2021   Procedure: BRONCHIAL NEEDLE ASPIRATION BIOPSIES;  Surgeon: Garner Nash, DO;  Location: [facility];  Service: Pulmonary;;   BRONCHIAL WASHINGS  02/16/2021   Procedure: BRONCHIAL WASHINGS;  Surgeon: Garner Nash, DO;  Location: [facility];  Service: Pulmonary;;   CATARACT EXTRACTION     bilateral   CHOLECYSTECTOMY N/A 02/10/2017   Procedure: LAPAROSCOPIC CHOLECYSTECTOMY WITH INTRAOPERATIVE CHOLANGIOGRAM;  Surgeon: Alphonsa Overall, MD;  Location: [facility];  Service: General;  Laterality: N/A;   COLONOSCOPY      FIDUCIAL MARKER PLACEMENT  02/16/2021   Procedure: FIDUCIAL MARKER PLACEMENT;  Surgeon: Garner Nash, DO;  Location: [facility];  Service: Pulmonary;;   VIDEO BRONCHOSCOPY WITH ENDOBRONCHIAL NAVIGATION Right 02/16/2021   Procedure: VIDEO BRONCHOSCOPY WITH ENDOBRONCHIAL NAVIGATION;  Surgeon: Garner Nash, DO;  Location: [facility];  Service: Pulmonary;  Laterality: Right;    There were no vitals filed for this visit.    Subjective Assessment - 09/16/21 1154     Subjective Saw Dr. Jaynee Eagles in the fall, she wanted him to have a DAT scan.  Wife provides history.  He was overly medicated, dragging his feet, not walking a lot and sitting a lot.  Dr. Jaynee Eagles has noticed some facial expression changes and decreased arm swing, and she has made a referral for [facility].  Wife feels movement is very slow.  Pt reports several times, "I move fine, there's nothing wrong."    Patient is accompained by: Family member   wife, [name]   Diagnostic tests awaiting DAT scan; has had MRI    Patient Stated Goals Pt does not have a goal; wife would like to have him walk more and use the equipment at the [facility].    Currently in Pain? No/denies  [facility] PT Assessment - 09/16/21 1201       Assessment   Medical Diagnosis gait abnormality and Parkinsonism    Referring Provider (PT) Jaynee Eagles    Onset Date/Surgical Date 09/09/21   wife reports they have noted slow movements over the past year.     Precautions   Precautions Fall      Balance Screen   Has the patient fallen in the past 6 months No    Has the patient had a decrease in activity level because of a fear of falling?  No    Is the patient reluctant to leave their home because of a fear of falling?  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    Leisure Has been going to the [employer]      Observation/Other Assessments   Focus on Therapeutic Outcomes (FOTO)  NA      ROM / Strength   AROM / PROM / Strength AROM;Strength      AROM   Overall AROM  Within functional limits for tasks performed      Strength   Overall Strength Within functional limits for tasks performed      Transfers   Transfers Sit to Stand;Stand to Sit    Sit to Stand 6: Modified independent (Device/Increase time);Without upper extremity assist;From chair/3-in-1    Five time sit to stand comments  10.03    Stand to Sit 6: Modified independent (Device/Increase time);Without upper extremity assist;To chair/3-in-1      Ambulation/Gait   Ambulation/Gait Yes    Ambulation/Gait Assistance 7: Independent    Ambulation Distance (Feet) 60 Feet   x 4   Assistive device None    Gait Pattern Step-through pattern;Decreased arm swing - left   Slight forward flexed posture with gait   Ambulation Surface Level;Indoor    Gait velocity 9.54 sec = 3.44 ft/sec      Standardized Balance Assessment   Standardized Balance Assessment Timed Up and Go Test;Mini-BESTest      Mini-BESTest   Sit To Stand Normal: Comes to stand without use of hands and stabilizes independently.    Rise to Toes Moderate: Heels up, but not full range (smaller than when holding hands), OR noticeable instability for 3 s.    Stand on one leg (left) Moderate: < 20 s   9.93, 3.63   Stand on one leg (right) Moderate: < 20 s   2.09, 3.07   Stand on one leg - lowest score 1    Compensatory Stepping Correction - Forward Normal: Recovers independently with a single, large step (second realignement is allowed).   slowed recovery back to midline   Compensatory Stepping Correction - Backward  Normal: Recovers independently with a single, large step   slowed recovery back to midline   Compensatory Stepping Correction - Left Lateral Severe: Falls, or cannot step    Compensatory Stepping Correction - Right Lateral Severe:  Falls, or cannot step    Stepping Corredtion Lateral - lowest score 0  Stance - Feet together, eyes open, firm surface  Normal: 30s    Stance - Feet together, eyes closed, foam surface  Moderate: < 30s   <20 sec, incr sway   Incline - Eyes Closed Normal: Stands independently 30s and aligns with gravity    Change in Gait Speed Normal: Significantly changes walkling speed without imbalance    Walk with head turns - Horizontal Moderate: performs head turns with reduction in gait speed.   7.38   Walk with pivot turns Normal: Turns with feet close FAST (< 3 steps) with good balance.    Step over obstacles Moderate: Steps over box but touches box OR displays cautious behavior by slowing gait.    Timed UP & GO with Dual Task Moderate: Dual Task affects either counting OR walking (>10%) when compared to the TUG without Dual Task.    Mini-BEST total score 20      Timed Up and Go Test   Normal TUG (seconds) 8.59    Manual TUG (seconds) 9.28    Cognitive TUG (seconds) 9.62    TUG Comments Scores >10% difference indicate increased fall risk, difficulty with dual tasking.                        Objective measurements completed on examination: See above findings.                PT Education - 09/16/21 1240     Education Details Eval results, POC    Person(s) Educated Patient;Spouse    Methods Explanation    Comprehension Verbalized understanding                 PT Long Term Goals - 09/16/21 1246       PT LONG TERM GOAL #1   Title Pt will be independent with HEP for improved balance and gait.  TARGET 10/15/2021    Time 4    Period Weeks    Status New      PT LONG TERM GOAL #2   Title Pt will improve MiniBESTest score to at  least 24/28 to decrease fall risk.    Baseline 20/28    Time 4    Period Weeks    Status New      PT LONG TERM GOAL #3   Title 6 MWT to be assessed and goal to be written as appropriate.    Time 4    Period Weeks    Status New      PT LONG TERM GOAL #4   Title Pt will verbalize plans for transition to appropriate community fitness upon d/c to maximize gains made in PT.    Time 4    Period Weeks    Status New                    Plan - 09/16/21 1241     Clinical Impression Statement Pt is a 66 year old male who presents to OPPT upon referral from Dr. Jaynee Eagles for gait abnormality and possible Parkinsonism.  Wife provides history, with reports that patient has been slowing with overall movement and has had decreased interest in and participation in activity/exercise in the past year.  Patient himself repeatedly reports he "is fine and does not need to be here."  Pt presents with decreased timing and coordination of gait, with decreased L arm swing noted, flat affect, decreased balance, notably slowed balance reactions in push and release test all  directions.  He also presents with decreased vestibular system use for balance.  He is at increased fall risk per MiniBESTest score.  He would benefit from skilled PT to address the above stated deficits to decrease fall risk and improve overall functional mobility.    Personal Factors and Comorbidities Comorbidity 3+    Comorbidities PMH:  anemia, anxiety, depression, DM, HLD, neuropathy    Examination-Activity Limitations Locomotion Level;Stand    Examination-Participation Restrictions Community Activity;Other   fitness activities   Stability/Clinical Decision Making Stable/Uncomplicated    Clinical Decision Making Low    Rehab Potential Good    PT Frequency 2x / week    PT Duration 4 weeks   plus eval   PT Treatment/Interventions ADLs/Self Care Home Management;Gait training;Functional mobility training;Therapeutic  activities;Therapeutic exercise;Balance training;Neuromuscular re-education;Patient/family education    PT Next Visit Plan Perform 6MWT (or 2MWT) and write goal as appropriate.  Initiate HEP for [facility]WR! Moves in standing, balance strategies; walking program for home    Consulted and Agree with Plan of Care Patient;Family member/caregiver    Family Member Consulted wife             Patient will benefit from skilled therapeutic intervention in order to improve the following deficits and impairments:  Abnormal gait, Difficulty walking, Decreased balance, Decreased mobility  Visit Diagnosis: Other abnormalities of gait and mobility  Unsteadiness on feet     Problem List Patient Active Problem List   Diagnosis Date Noted   Benzodiazepine withdrawal without complication ([place])    Type 2 diabetes mellitus ([place]) 03/27/2021   HTN (hypertension) 03/27/2021   Parkinsonian features 03/27/2021   AMS (altered mental status) 03/26/2021   Lung mass 01/29/2021   Need for DTP vaccine 11/15/2020   Pure hyperglyceridemia 11/12/2020   Neuropathy 11/11/2020   Type II diabetes mellitus with complication ([place]) 24/05/7352   Deficiency anemia 11/11/2020   Hyperlipidemia with target LDL less than 100 11/11/2020   Encounter for general adult medical examination with abnormal findings 11/11/2020   Loud snoring 11/11/2020   Cough 11/11/2020   Simple chronic bronchitis ([place]) 11/11/2020   DOE (dyspnea on exertion) 11/11/2020   ANXIETY 12/17/2007   Smokers' cough ([place]) 12/17/2007   DEPRESSION 12/17/2007   COLONIC POLYPS 05/07/2007    Loyd Salvador W., PT 09/16/2021, 12:49 PM  [facility] [address], [place], [place], [address] Phone: [phone]   Fax:  [phone]  Name: Jeremy Shannon MRN: [id number] Date of Birth: [birth year]

## 2021-09-20 ENCOUNTER — Other Ambulatory Visit: Payer: Self-pay

## 2021-09-20 ENCOUNTER — Encounter: Payer: Self-pay | Admitting: Physical Therapy

## 2021-09-20 ENCOUNTER — Ambulatory Visit: Payer: Medicare Other | Admitting: Physical Therapy

## 2021-09-20 DIAGNOSIS — R2681 Unsteadiness on feet: Secondary | ICD-10-CM | POA: Diagnosis not present

## 2021-09-20 DIAGNOSIS — R269 Unspecified abnormalities of gait and mobility: Secondary | ICD-10-CM | POA: Diagnosis not present

## 2021-09-20 DIAGNOSIS — G20 Parkinson's disease: Secondary | ICD-10-CM | POA: Diagnosis not present

## 2021-09-20 DIAGNOSIS — R2689 Other abnormalities of gait and mobility: Secondary | ICD-10-CM

## 2021-09-20 NOTE — Therapy (Signed)
[facility] [address], [place], [place], [address] Phone: [phone]   Fax:  [phone]  Physical Therapy Treatment  Patient Details  Name: Jeremy Shannon MRN: [id number] Date of Birth: 1955-11-25 Referring Provider (PT): Jaynee Eagles   Encounter Date: 09/20/2021   PT End of Session - 09/20/21 1147     Visit Number 2    Number of Visits 9    Date for PT Re-Evaluation 10/15/21    Authorization Type UHC Medicare    Progress Note Due on Visit 10    PT Start Time 1103    PT Stop Time 1142    PT Time Calculation (min) 39 min    Activity Tolerance Patient tolerated treatment well   HR elevated (95 bpm at rest;113-115 bpm with activity)-pt with no c/o   Behavior During Therapy Flat affect             Past Medical History:  Diagnosis Date   Anemia    pt denies   Anxiety    Cataract    Depression    Diabetes mellitus without complication ([place])    type 2   Dyspnea    Hyperlipidemia    PONV (postoperative nausea and vomiting)     Past Surgical History:  Procedure Laterality Date   BRONCHIAL BIOPSY  02/16/2021   Procedure: BRONCHIAL BIOPSIES;  Surgeon: Garner Nash, DO;  Location: [facility];  Service: Pulmonary;;   BRONCHIAL BRUSHINGS  02/16/2021   Procedure: BRONCHIAL BRUSHINGS;  Surgeon: Garner Nash, DO;  Location: [facility];  Service: Pulmonary;;   BRONCHIAL NEEDLE ASPIRATION BIOPSY  02/16/2021   Procedure: BRONCHIAL NEEDLE ASPIRATION BIOPSIES;  Surgeon: Garner Nash, DO;  Location: [facility];  Service: Pulmonary;;   BRONCHIAL WASHINGS  02/16/2021   Procedure: BRONCHIAL WASHINGS;  Surgeon: Garner Nash, DO;  Location: [facility];  Service: Pulmonary;;   CATARACT EXTRACTION     bilateral   CHOLECYSTECTOMY N/A 02/10/2017   Procedure: LAPAROSCOPIC CHOLECYSTECTOMY WITH INTRAOPERATIVE CHOLANGIOGRAM;  Surgeon: Alphonsa Overall, MD;  Location: [facility];  Service: General;  Laterality: N/A;   COLONOSCOPY      FIDUCIAL MARKER PLACEMENT  02/16/2021   Procedure: FIDUCIAL MARKER PLACEMENT;  Surgeon: Garner Nash, DO;  Location: [facility];  Service: Pulmonary;;   VIDEO BRONCHOSCOPY WITH ENDOBRONCHIAL NAVIGATION Right 02/16/2021   Procedure: VIDEO BRONCHOSCOPY WITH ENDOBRONCHIAL NAVIGATION;  Surgeon: Garner Nash, DO;  Location: [facility];  Service: Pulmonary;  Laterality: Right;    There were no vitals filed for this visit.   Subjective Assessment - 09/20/21 1106     Subjective No changes, no pain.  No falls.    Patient is accompained by: Family member   wife, [name]   Diagnostic tests awaiting DAT scan; has had MRI    Patient Stated Goals Pt does not have a goal; wife would like to have him walk more and use the equipment at the [facility].    Currently in Pain? No/denies                [facility] PT Assessment - 09/20/21 0001       6 Minute Walk- Baseline   6 Minute Walk- Baseline yes    BP (mmHg) 120/95    HR (bpm) 94    02 Sat (%RA) 96 %    Modified Borg Scale for Dyspnea 2- Mild shortness of breath      6 Minute walk- Post Test   6 Minute  Walk Post Test yes    BP (mmHg) 127/93    HR (bpm) 104    Modified Borg Scale for Dyspnea 0.5- Very, very slight shortness of breath    Perceived Rate of Exertion (Borg) 7- Very, very light      6 minute walk test results    Aerobic Endurance Distance Walked 1128    Endurance additional comments Normal values:  1876 ft is normal for 71-60 year olds.                    Neuro Re-education:    Pt performs PWR! Moves in standing position    PWR! Up for improved posture x10 reps  PWR! Rock for improved weighshifting x 5 reps each side, 2 reps   PWR! Twist for improved trunk rotation x 5 reps each side  PWR! Step for improved step initiation x 10 reps each side.  Verbal, visual, tactile cues provided for technique.  Discussed rationale of each PWR! Move in relation to functional activities.  HR after standing activities  is 113-115 bpm          Balance Exercises - 09/20/21 0001       Balance Exercises: Standing   Stepping Strategy Anterior;Posterior;10 reps;Limitations    Stepping Strategy Limitations Cues for technique, difficulty sequencing step and weigthshift in posterior direction, needing verbal, tactile, visual cues.                PT Education - 09/20/21 1146     Education Details 6MWT results (compared to norms), rationale for PWR! Moves exercises    Person(s) Educated Patient    Methods Explanation;Demonstration    Comprehension Verbalized understanding                 PT Long Term Goals - 09/20/21 1151       PT LONG TERM GOAL #1   Title Pt will be independent with HEP for improved balance and gait.  TARGET 10/15/2021    Time 4    Period Weeks    Status On-going      PT LONG TERM GOAL #2   Title Pt will improve MiniBESTest score to at least 24/28 to decrease fall risk.    Baseline 20/28    Time 4    Period Weeks    Status On-going      PT LONG TERM GOAL #3   Title 6 MWT to improve by at least 100 ft for improved endurance, efficiency with gait.    Time 4    Period Weeks    Status Revised      PT LONG TERM GOAL #4   Title Pt will verbalize plans for transition to appropriate community fitness upon d/c to maximize gains made in PT.    Time 4    Period Weeks    Status On-going                   Plan - 09/20/21 1148     Clinical Impression Statement 6 MWT assessed today, with pt ambulating 1128 ft, which is lower than age related normal values of 1876 ft; pt's HR at rest is 95 bpm and with activity is 115 bpm.  Pt reports not typically doing as much activity as in PT session today, and several rest breaks given between activities.  Initiated standing PWR! Moves in session today, with pt requiring verbal, tactile, visual cues for technique, so not given yet for HEP.  Pt will  continue to benefit from skilled PT towards goals for improved overall  functional mobility and decreased fall risk.    Personal Factors and Comorbidities Comorbidity 3+    Comorbidities PMH:  anemia, anxiety, depression, DM, HLD, neuropathy    Examination-Activity Limitations Locomotion Level;Stand    Examination-Participation Restrictions Community Activity;Other   fitness activities   Stability/Clinical Decision Making Stable/Uncomplicated    Rehab Potential Good    PT Frequency 2x / week    PT Duration 4 weeks   plus eval   PT Treatment/Interventions ADLs/Self Care Home Management;Gait training;Functional mobility training;Therapeutic activities;Therapeutic exercise;Balance training;Neuromuscular re-education;Patient/family education    PT Next Visit Plan Continue to work on [place]! Moves in standing and initiate HEP as appropriate, balance strategies; walking program for home.  Check HR throughout session.    Consulted and Agree with Plan of Care Patient             Patient will benefit from skilled therapeutic intervention in order to improve the following deficits and impairments:  Abnormal gait, Difficulty walking, Decreased balance, Decreased mobility  Visit Diagnosis: Other abnormalities of gait and mobility  Unsteadiness on feet     Problem List Patient Active Problem List   Diagnosis Date Noted   Benzodiazepine withdrawal without complication ([place])    Type 2 diabetes mellitus ([place]) 03/27/2021   HTN (hypertension) 03/27/2021   Parkinsonian features 03/27/2021   AMS (altered mental status) 03/26/2021   Lung mass 01/29/2021   Need for DTP vaccine 11/15/2020   Pure hyperglyceridemia 11/12/2020   Neuropathy 11/11/2020   Type II diabetes mellitus with complication ([place]) 74/16/3845   Deficiency anemia 11/11/2020   Hyperlipidemia with target LDL less than 100 11/11/2020   Encounter for general adult medical examination with abnormal findings 11/11/2020   Loud snoring 11/11/2020   Cough 11/11/2020   Simple chronic bronchitis ([place])  11/11/2020   DOE (dyspnea on exertion) 11/11/2020   ANXIETY 12/17/2007   Smokers' cough ([place]) 12/17/2007   DEPRESSION 12/17/2007   COLONIC POLYPS 05/07/2007    Revecca Nachtigal W., PT 09/20/2021, 11:52 AM  [facility] [address], [place], [place], [address] Phone: [phone]   Fax:  [phone]  Name: FUQUAN WILSON MRN: [id number] Date of Birth: 09/25/1955

## 2021-09-22 DIAGNOSIS — Z79891 Long term (current) use of opiate analgesic: Secondary | ICD-10-CM | POA: Diagnosis not present

## 2021-09-23 ENCOUNTER — Other Ambulatory Visit: Payer: Self-pay

## 2021-09-23 ENCOUNTER — Ambulatory Visit: Payer: Medicare Other | Admitting: Physical Therapy

## 2021-09-23 ENCOUNTER — Telehealth: Payer: Self-pay | Admitting: Physical Therapy

## 2021-09-23 ENCOUNTER — Emergency Department (HOSPITAL_BASED_OUTPATIENT_CLINIC_OR_DEPARTMENT_OTHER)
Admission: EM | Admit: 2021-09-23 | Discharge: 2021-09-23 | Disposition: A | Discharge status: Home / Self Care | Payer: Medicare Other | Attending: Emergency Medicine | Admitting: Emergency Medicine

## 2021-09-23 ENCOUNTER — Telehealth: Payer: Self-pay | Admitting: Internal Medicine

## 2021-09-23 ENCOUNTER — Encounter (HOSPITAL_BASED_OUTPATIENT_CLINIC_OR_DEPARTMENT_OTHER): Payer: Self-pay | Admitting: Emergency Medicine

## 2021-09-23 ENCOUNTER — Encounter: Payer: Self-pay | Admitting: Physical Therapy

## 2021-09-23 VITALS — BP 100/73 | HR 117

## 2021-09-23 DIAGNOSIS — R030 Elevated blood-pressure reading, without diagnosis of hypertension: Secondary | ICD-10-CM | POA: Insufficient documentation

## 2021-09-23 DIAGNOSIS — R000 Tachycardia, unspecified: Secondary | ICD-10-CM | POA: Diagnosis not present

## 2021-09-23 DIAGNOSIS — Z5321 Procedure and treatment not carried out due to patient leaving prior to being seen by health care provider: Secondary | ICD-10-CM | POA: Diagnosis not present

## 2021-09-23 DIAGNOSIS — R2681 Unsteadiness on feet: Secondary | ICD-10-CM | POA: Insufficient documentation

## 2021-09-23 DIAGNOSIS — R2689 Other abnormalities of gait and mobility: Secondary | ICD-10-CM

## 2021-09-23 NOTE — ED Notes (Signed)
Pt. Was advised against leaving and went out the door. They said they waited long enough and walked out without being seen.

## 2021-09-23 NOTE — Telephone Encounter (Signed)
Caller, [name] w/ [facility] states patient is in office for OT appt today  Caller states patient's resting heart rate is 113-117 while sitting, bp 100/73  Caller states she does not feel comfortable engaging patient in any activities, patient states he is ok to drive home  Caller seeking recommendations if patient needs to be seen by provider    Please call [name] [phone]

## 2021-09-23 NOTE — ED Triage Notes (Addendum)
Saw PT today and his BP was up and his heart rate was to high  116  at rest , pt denies CP or sob

## 2021-09-23 NOTE — Therapy (Signed)
[facility] [address] [place], [place], [address] Phone: [phone]   Fax:  [phone]  Physical Therapy Treatment  Patient Details  Name: Jeremy Shannon MRN: [id number] Date of Birth: 08/23/1956 Referring Provider (PT): Jaynee Eagles   Encounter Date: 09/23/2021   PT End of Session - 09/23/21 1152     Visit Number 3    Number of Visits 9    Date for PT Re-Evaluation 10/15/21    Authorization Type UHC Medicare    Progress Note Due on Visit 10    PT Start Time 1151    PT Stop Time 1215    PT Time Calculation (min) 24 min    Activity Tolerance Treatment limited secondary to medical complications (Comment)   HR elevated (113-117 at rest)   Behavior During Therapy Flat affect;Anxious   pt ready to leave therapy session once PT explained we cannot proceed with therapy today.            Past Medical History:  Diagnosis Date   Anemia    pt denies   Anxiety    Cataract    Depression    Diabetes mellitus without complication ([facility])    type 2   Dyspnea    Hyperlipidemia    PONV (postoperative nausea and vomiting)     Past Surgical History:  Procedure Laterality Date   BRONCHIAL BIOPSY  02/16/2021   Procedure: BRONCHIAL BIOPSIES;  Surgeon: Garner Nash, DO;  Location: [facility];  Service: Pulmonary;;   BRONCHIAL BRUSHINGS  02/16/2021   Procedure: BRONCHIAL BRUSHINGS;  Surgeon: Garner Nash, DO;  Location: [facility];  Service: Pulmonary;;   BRONCHIAL NEEDLE ASPIRATION BIOPSY  02/16/2021   Procedure: BRONCHIAL NEEDLE ASPIRATION BIOPSIES;  Surgeon: Garner Nash, DO;  Location: [facility];  Service: Pulmonary;;   BRONCHIAL WASHINGS  02/16/2021   Procedure: BRONCHIAL WASHINGS;  Surgeon: Garner Nash, DO;  Location: [facility];  Service: Pulmonary;;   CATARACT EXTRACTION     bilateral   CHOLECYSTECTOMY N/A 02/10/2017   Procedure: LAPAROSCOPIC CHOLECYSTECTOMY WITH INTRAOPERATIVE CHOLANGIOGRAM;  Surgeon: Alphonsa Overall, MD;  [facility];  Service: General;  Laterality: N/A;   COLONOSCOPY     FIDUCIAL MARKER PLACEMENT  02/16/2021   Procedure: FIDUCIAL MARKER PLACEMENT;  Surgeon: Garner Nash, DO;  Location: [facility];  Service: Pulmonary;;   VIDEO BRONCHOSCOPY WITH ENDOBRONCHIAL NAVIGATION Right 02/16/2021   Procedure: VIDEO BRONCHOSCOPY WITH ENDOBRONCHIAL NAVIGATION;  Surgeon: Garner Nash, DO;  Location: [facility];  Service: Pulmonary;  Laterality: Right;    Vitals:   09/23/21 1158  BP: 100/73  Pulse: (!) 117  SpO2: 97%     Subjective Assessment - 09/23/21 1152     Subjective No changes since last visit.  Does report felling  bit dizzy today.    Patient is accompained by: Family member   wife, [name]   Diagnostic tests awaiting DAT scan; has had MRI    Patient Stated Goals Pt does not have a goal; wife would like to have him walk more and use the equipment at the [facility].    Currently in Pain? No/denies              Assessed HR several times during seated session.  HR 113 bpm, 115 bpm.  No c/o shortness of breath.  Did educate patient on need to call 911/urgent follow-up if he has any SOB, chest pains, sudden weakness or numbness.  PT Education - 09/23/21 1615     Education Details Parameters for PT indicate contraindication to activity with resting HR >100 bpm.  Recommended pt needs to follow up with MD (PT placed call to Dr. Ronnald Ramp during session), as elevated HR is limited initiation and progression of PT.    Person(s) Educated Patient    Methods Explanation    Comprehension Verbalized understanding                 PT Long Term Goals - 09/20/21 1151       PT LONG TERM GOAL #1   Title Pt will be independent with HEP for improved balance and gait.  TARGET 10/15/2021    Time 4    Period Weeks    Status On-going      PT LONG TERM GOAL #2   Title Pt will improve MiniBESTest score to at least 24/28 to decrease  fall risk.    Baseline 20/28    Time 4    Period Weeks    Status On-going      PT LONG TERM GOAL #3   Title 6 MWT to improve by at least 100 ft for improved endurance, efficiency with gait.    Time 4    Period Weeks    Status Revised      PT LONG TERM GOAL #4   Title Pt will verbalize plans for transition to appropriate community fitness upon d/c to maximize gains made in PT.    Time 4    Period Weeks    Status On-going                   Plan - 09/23/21 1618     Clinical Impression Statement Pt's elevated resting HR today limiting any progression of activity today in PT session.  Therapist educated pt in vitals/parameters safe to proceed with activity.  Discussed importance of follow-up with physician to see what recommendations he has for pt's elevated HR, to allow for future participation in therapy.    Personal Factors and Comorbidities Comorbidity 3+    Comorbidities PMH:  anemia, anxiety, depression, DM, HLD, neuropathy    Examination-Activity Limitations Locomotion Level;Stand    Examination-Participation Restrictions Community Activity;Other   fitness activities   Stability/Clinical Decision Making Stable/Uncomplicated    Rehab Potential Good    PT Frequency 2x / week    PT Duration 4 weeks   plus eval   PT Treatment/Interventions ADLs/Self Care Home Management;Gait training;Functional mobility training;Therapeutic activities;Therapeutic exercise;Balance training;Neuromuscular re-education;Patient/family education    PT Next Visit Plan Continue to work on [place]! Moves in sitting/standing and initiate HEP as appropriate, balance strategies; walking program for home.  Check HR throughout session.    Consulted and Agree with Plan of Care Patient             Patient will benefit from skilled therapeutic intervention in order to improve the following deficits and impairments:  Abnormal gait, Difficulty walking, Decreased balance, Decreased mobility  Visit  Diagnosis: Other abnormalities of gait and mobility     Problem List Patient Active Problem List   Diagnosis Date Noted   Benzodiazepine withdrawal without complication ([place])    Type 2 diabetes mellitus ([place]) 03/27/2021   HTN (hypertension) 03/27/2021   Parkinsonian features 03/27/2021   AMS (altered mental status) 03/26/2021   Lung mass 01/29/2021   Need for DTP vaccine 11/15/2020   Pure hyperglyceridemia 11/12/2020   Neuropathy 11/11/2020   Type II diabetes mellitus with complication ([place])  11/11/2020   Deficiency anemia 11/11/2020   Hyperlipidemia with target LDL less than 100 11/11/2020   Encounter for general adult medical examination with abnormal findings 11/11/2020   Loud snoring 11/11/2020   Cough 11/11/2020   Simple chronic bronchitis ([place]) 11/11/2020   DOE (dyspnea on exertion) 11/11/2020   ANXIETY 12/17/2007   Smokers' cough ([place]) 12/17/2007   DEPRESSION 12/17/2007   COLONIC POLYPS 05/07/2007    Birgitta Uhlir W., PT 09/23/2021, 4:21 PM  [facility] [address] [place], [place], [address] Phone: [phone]   Fax:  [phone]  Name: SAHIR TOLSON MRN: [id number] Date of Birth: September 16, 1955

## 2021-09-23 NOTE — Telephone Encounter (Signed)
Message left [name] to return call to clinic to go over recommendations.  Called patient and also left message with recommendations.  My-chart message sent as well in follow up.

## 2021-09-23 NOTE — Telephone Encounter (Signed)
Called and spoke with patient's wife, relaying information received from [place] at [facility].  Dr. Ronnald Ramp recommends going to ED or Urgent Care to be seen today based on HR and blood pressure measures relayed from PT's phone call to their office.  Wife verbalizes understanding.  Mady Haagensen, PT 09/23/21 1:44 PM Phone: [phone] Fax: [phone]

## 2021-09-24 ENCOUNTER — Encounter: Payer: Self-pay | Admitting: Physical Therapy

## 2021-09-24 NOTE — Therapy (Signed)
[facility] [address] [place], [place], [address] Phone: [phone]   Fax:  [phone]  Patient Details  Name: Jeremy Shannon MRN: [id number] Date of Birth: 07-11-1956 Referring Provider:  No ref. provider found  Encounter Date: 09/24/2021  PHYSICAL THERAPY DISCHARGE SUMMARY  Visits from Start of Care: 3  Current functional level related to goals / functional outcomes: See eval, as functional level at evaluation baseline, with pt requesting to d/c after 2nd PT visit.   Remaining deficits: See eval   Education / Equipment: Unable to fully address, as pt requested d/c 09/24/2021.   Patient agrees to discharge. Patient goals were not met. Patient is being discharged due to the patient's request. Pt's wife called and stated that patient requests to cancel remaining PT appointments, would like to discharge and not return.  Maleeyah Mccaughey W., PT 09/24/2021, 12:00 PM  [facility] [address], [place], [place], [address] Phone: [phone]   Fax:  [phone]

## 2021-09-27 ENCOUNTER — Ambulatory Visit: Payer: Medicare Other | Admitting: Physical Therapy

## 2021-09-30 ENCOUNTER — Ambulatory Visit: Payer: Medicare Other | Admitting: Physical Therapy

## 2021-10-05 ENCOUNTER — Ambulatory Visit (HOSPITAL_COMMUNITY)
Admission: RE | Admit: 2021-10-05 | Discharge: 2021-10-05 | Disposition: A | Discharge status: Home / Self Care | Payer: Medicare Other | Source: Ambulatory Visit | Attending: Acute Care | Admitting: Acute Care

## 2021-10-05 ENCOUNTER — Other Ambulatory Visit: Payer: Self-pay

## 2021-10-05 DIAGNOSIS — F17200 Nicotine dependence, unspecified, uncomplicated: Secondary | ICD-10-CM | POA: Insufficient documentation

## 2021-10-05 DIAGNOSIS — I700 Atherosclerosis of aorta: Secondary | ICD-10-CM | POA: Diagnosis not present

## 2021-10-05 DIAGNOSIS — Z87891 Personal history of nicotine dependence: Secondary | ICD-10-CM | POA: Diagnosis not present

## 2021-10-05 DIAGNOSIS — J439 Emphysema, unspecified: Secondary | ICD-10-CM | POA: Diagnosis not present

## 2021-10-06 ENCOUNTER — Ambulatory Visit: Payer: Medicare Other | Admitting: Physical Therapy

## 2021-10-12 ENCOUNTER — Ambulatory Visit: Payer: Medicare Other | Admitting: Physical Therapy

## 2021-11-10 NOTE — Telephone Encounter (Signed)
NOTE NOT NEEDED ?

## 2021-11-11 ENCOUNTER — Encounter: Payer: Self-pay | Admitting: Internal Medicine

## 2021-11-11 ENCOUNTER — Ambulatory Visit (INDEPENDENT_AMBULATORY_CARE_PROVIDER_SITE_OTHER): Payer: Medicare Other | Admitting: Internal Medicine

## 2021-11-11 ENCOUNTER — Other Ambulatory Visit: Payer: Self-pay

## 2021-11-11 VITALS — BP 126/78 | HR 109 | Temp 98.2°F | Ht 68.0 in | Wt 209.0 lb

## 2021-11-11 DIAGNOSIS — N401 Enlarged prostate with lower urinary tract symptoms: Secondary | ICD-10-CM | POA: Diagnosis not present

## 2021-11-11 DIAGNOSIS — E785 Hyperlipidemia, unspecified: Secondary | ICD-10-CM

## 2021-11-11 DIAGNOSIS — R000 Tachycardia, unspecified: Secondary | ICD-10-CM | POA: Diagnosis not present

## 2021-11-11 DIAGNOSIS — Z1159 Encounter for screening for other viral diseases: Secondary | ICD-10-CM | POA: Insufficient documentation

## 2021-11-11 DIAGNOSIS — Z23 Encounter for immunization: Secondary | ICD-10-CM

## 2021-11-11 DIAGNOSIS — Z0000 Encounter for general adult medical examination without abnormal findings: Secondary | ICD-10-CM

## 2021-11-11 DIAGNOSIS — D126 Benign neoplasm of colon, unspecified: Secondary | ICD-10-CM

## 2021-11-11 DIAGNOSIS — E118 Type 2 diabetes mellitus with unspecified complications: Secondary | ICD-10-CM

## 2021-11-11 DIAGNOSIS — Z0001 Encounter for general adult medical examination with abnormal findings: Secondary | ICD-10-CM

## 2021-11-11 DIAGNOSIS — I10 Essential (primary) hypertension: Secondary | ICD-10-CM

## 2021-11-11 LAB — CBC WITH DIFFERENTIAL/PLATELET
Basophils Absolute: 0.1 10*3/uL (ref 0.0–0.1)
Basophils Relative: 0.6 % (ref 0.0–3.0)
Eosinophils Absolute: 0.1 10*3/uL (ref 0.0–0.7)
Eosinophils Relative: 0.6 % (ref 0.0–5.0)
HCT: 49 % (ref 39.0–52.0)
Hemoglobin: 16.6 g/dL (ref 13.0–17.0)
Lymphocytes Relative: 26 % (ref 12.0–46.0)
Lymphs Abs: 2.6 10*3/uL (ref 0.7–4.0)
MCHC: 33.8 g/dL (ref 30.0–36.0)
MCV: 95.6 fl (ref 78.0–100.0)
Monocytes Absolute: 0.6 10*3/uL (ref 0.1–1.0)
Monocytes Relative: 6.2 % (ref 3.0–12.0)
Neutro Abs: 6.6 10*3/uL (ref 1.4–7.7)
Neutrophils Relative %: 66.6 % (ref 43.0–77.0)
Platelets: 314 10*3/uL (ref 150.0–400.0)
RBC: 5.12 Mil/uL (ref 4.22–5.81)
RDW: 13.3 % (ref 11.5–15.5)
WBC: 10 10*3/uL (ref 4.0–10.5)

## 2021-11-11 LAB — HEPATIC FUNCTION PANEL
ALT: 19 U/L (ref 0–53)
AST: 13 U/L (ref 0–37)
Albumin: 4.5 g/dL (ref 3.5–5.2)
Alkaline Phosphatase: 75 U/L (ref 39–117)
Bilirubin, Direct: 0.1 mg/dL (ref 0.0–0.3)
Total Bilirubin: 0.4 mg/dL (ref 0.2–1.2)
Total Protein: 7.1 g/dL (ref 6.0–8.3)

## 2021-11-11 LAB — URINALYSIS, ROUTINE W REFLEX MICROSCOPIC
Ketones, ur: 15 — AB
Leukocytes,Ua: NEGATIVE
Nitrite: NEGATIVE
Specific Gravity, Urine: >=1.03 — AB (ref 1.000–1.030)
Total Protein, Urine: NEGATIVE
Urine Glucose: >=1000 — AB
Urobilinogen, UA: 0.2 (ref 0.0–1.0)
pH: 5.5 (ref 5.0–8.0)

## 2021-11-11 LAB — LIPID PANEL
Cholesterol: 149 mg/dL (ref 0–200)
HDL: 47.4 mg/dL (ref >39.00)
NonHDL: 101.8
Total CHOL/HDL Ratio: 3
Triglycerides: 217 mg/dL — ABNORMAL HIGH (ref 0.0–149.0)
VLDL: 43.4 mg/dL — ABNORMAL HIGH (ref 0.0–40.0)

## 2021-11-11 LAB — BASIC METABOLIC PANEL
BUN: 18 mg/dL (ref 6–23)
CO2: 25 mEq/L (ref 19–32)
Calcium: 9.5 mg/dL (ref 8.4–10.5)
Chloride: 101 mEq/L (ref 96–112)
Creatinine, Ser: 1.09 mg/dL (ref 0.40–1.50)
GFR: 70.05 mL/min (ref >60.00)
Glucose, Bld: 82 mg/dL (ref 70–99)
Potassium: 4 mEq/L (ref 3.5–5.1)
Sodium: 137 mEq/L (ref 135–145)

## 2021-11-11 LAB — MICROALBUMIN / CREATININE URINE RATIO
Creatinine,U: 187.6 mg/dL
Microalb Creat Ratio: 0.6 mg/g (ref 0.0–30.0)
Microalb, Ur: 1.1 mg/dL (ref 0.0–1.9)

## 2021-11-11 LAB — PSA: PSA: 0.42 ng/mL (ref 0.10–4.00)

## 2021-11-11 LAB — HEMOGLOBIN A1C: Hgb A1c MFr Bld: 5.9 % (ref 4.6–6.5)

## 2021-11-11 LAB — LDL CHOLESTEROL, DIRECT: Direct LDL: 70 mg/dL

## 2021-11-11 LAB — TSH: TSH: 3.09 u[IU]/mL (ref 0.35–5.50)

## 2021-11-11 MED ORDER — BOOSTRIX 5-2.5-18.5 LF-MCG/0.5 IM SUSP: 0.5000 mL | Freq: Once | INTRAMUSCULAR | 0 refills | Status: AC

## 2021-11-11 MED ORDER — SHINGRIX 50 MCG/0.5ML IM SUSR: 0.5000 mL | Freq: Once | INTRAMUSCULAR | 1 refills | Status: AC

## 2021-11-11 NOTE — Progress Notes (Signed)
Subjective:  Patient ID: Jeremy Shannon, male    DOB: [birth year]  Age: 66 y.o. MRN: [id number]  CC: Annual Exam, COPD, Hypertension, Diabetes, and Hyperlipidemia  This visit occurred during the SARS-CoV-2 public health emergency.  Safety protocols were in place, including screening questions prior to the visit, additional usage of staff PPE, and extensive cleaning of exam room while observing appropriate contact time as indicated for disinfecting solutions.    HPI Silus Lanzo Tani presents for a CPX and f/up -   His blood sugar has been well controlled. He denies polys, DOE, CP, DOE, SOB, or edema.  Outpatient Medications Prior to Visit  Medication Sig Dispense Refill   empagliflozin (JARDIANCE) 10 MG TABS tablet Take 10 mg by mouth daily.     icosapent Ethyl (VASCEPA) 1 g capsule Take 2 capsules (2 g total) by mouth 2 (two) times daily. 360 capsule 1   QUEtiapine (SEROQUEL) 100 MG tablet Take 300 mg by mouth at bedtime.      SYNJARDY 5-500 MG TABS TAKE 1 TABLET BY MOUTH TWICE DAILY 180 tablet 0   brexpiprazole (REXULTI) 2 MG TABS tablet Take 2 mg by mouth daily.     COVID-19 mRNA bivalent vaccine, Pfizer, (PFIZER COVID-19 VAC BIVALENT) injection Inject into the muscle. 0.3 mL 0   Fluticasone-Umeclidin-Vilant (TRELEGY ELLIPTA) 100-62.5-25 MCG/INH AEPB Inhale 1 puff into the lungs daily. 60 each 2   influenza vaccine adjuvanted (FLUAD) 0.5 ML injection Inject into the muscle. 0.5 mL 0   simvastatin (ZOCOR) 20 MG tablet Take 1 tablet (20 mg total) by mouth at bedtime. 90 tablet 1   SUMAtriptan (IMITREX) 100 MG tablet Take 100 mg by mouth every 2 (two) hours as needed for migraine. May repeat in 2 hours if headache persists or recurs.     Vitamin D, Ergocalciferol, (DRISDOL) 1.25 MG (50000 UNIT) CAPS capsule Take 50,000 Units by mouth once a week.     ALPRAZolam (XANAX) 1 MG tablet Take 1 tablet (1 mg total) by mouth 3 (three) times daily as needed for anxiety. (Patient not taking:  Reported on 09/16/2021) 90 tablet 1   fluvoxaMINE (LUVOX) 100 MG tablet Take 100 mg by mouth 3 (three) times daily. (Patient not taking: Reported on 09/16/2021)     hydrOXYzine (ATARAX/VISTARIL) 25 MG tablet Take 1 tablet (25 mg total) by mouth every 6 (six) hours. (Patient not taking: Reported on 09/16/2021) 12 tablet 0   metFORMIN (GLUCOPHAGE) 500 MG tablet Take by mouth 2 (two) times daily with a meal. (Patient not taking: Reported on 09/16/2021)     topiramate (TOPAMAX) 25 MG tablet Take 25 mg by mouth daily. (Patient not taking: Reported on 09/16/2021)     No facility-administered medications prior to visit.    ROS Review of Systems  Constitutional: Negative.  Negative for appetite change, diaphoresis and fatigue.  HENT: Negative.    Eyes: Negative.   Respiratory: Negative.  Negative for cough, chest tightness, shortness of breath and wheezing.   Cardiovascular:  Negative for chest pain, palpitations and leg swelling.  Gastrointestinal:  Negative for abdominal pain, constipation, diarrhea, nausea and vomiting.  Endocrine: Negative.   Genitourinary:  Positive for difficulty urinating. Negative for dysuria, scrotal swelling, testicular pain and urgency.       +nocturia  Musculoskeletal: Negative.  Negative for arthralgias and myalgias.  Skin: Negative.  Negative for color change and pallor.  Neurological: Negative.  Negative for dizziness, weakness, light-headedness and numbness.  Hematological:  Negative for adenopathy. Does not  bruise/bleed easily.  Psychiatric/Behavioral:  Positive for confusion, decreased concentration and dysphoric mood. Negative for agitation, behavioral problems, hallucinations, self-injury, sleep disturbance and suicidal ideas. The patient is nervous/anxious.    Objective:  BP 126/78 (BP Location: Left Arm, Patient Position: Sitting, Cuff Size: Large)    Pulse (!) 109    Temp 98.2 F (36.8 C) (Oral)    Ht 5\' 8"  (1.727 m)    Wt 209 lb (94.8 kg)    SpO2 98%    BMI 31.78  kg/m   BP Readings from Last 3 Encounters:  11/11/21 126/78  09/23/21 109/78  09/23/21 100/73    Wt Readings from Last 3 Encounters:  11/11/21 209 lb (94.8 kg)  09/23/21 210 lb (95.3 kg)  04/27/21 238 lb (108 kg)    Physical Exam Vitals reviewed.  HENT:     Nose: Nose normal.     Mouth/Throat:     Mouth: Mucous membranes are moist.  Eyes:     General: No scleral icterus.    Conjunctiva/sclera: Conjunctivae normal.  Cardiovascular:     Rate and Rhythm: Regular rhythm. Tachycardia present.     Heart sounds: Normal heart sounds, S1 normal and S2 normal. No murmur heard.   No friction rub. No gallop.     Comments: EKG- NSR, 99 bpm NS T wave changes No Q waves or LVH Musculoskeletal:     Right lower leg: No edema.     Left lower leg: No edema.  Neurological:     General: No focal deficit present.     Mental Status: He is alert. Mental status is at baseline.  Psychiatric:        Attention and Perception: He is inattentive.        Mood and Affect: Mood is depressed. Mood is not anxious. Affect is flat. Affect is not blunt or angry.        Speech: Speech is delayed. Speech is not rapid and pressured, slurred or tangential.        Behavior: Behavior is slowed and withdrawn. Behavior is not agitated or aggressive.        Thought Content: Thought content normal. Thought content is not paranoid or delusional. Thought content does not include homicidal or suicidal ideation.        Cognition and Memory: Cognition is impaired. Memory is impaired.    Lab Results  Component Value Date   WBC 10.0 11/11/2021   HGB 16.6 11/11/2021   HCT 49.0 11/11/2021   PLT 314.0 11/11/2021   GLUCOSE 82 11/11/2021   CHOL 149 11/11/2021   TRIG 217.0 (H) 11/11/2021   HDL 47.40 11/11/2021   LDLDIRECT 70.0 11/11/2021   ALT 19 11/11/2021   AST 13 11/11/2021   NA 137 11/11/2021   K 4.0 11/11/2021   CL 101 11/11/2021   CREATININE 1.09 11/11/2021   BUN 18 11/11/2021   CO2 25 11/11/2021   TSH  3.09 11/11/2021   PSA 0.42 11/11/2021   HGBA1C 5.9 11/11/2021   MICROALBUR 1.1 11/11/2021    CT CHEST LCS NODULE F/U W/O CONTRAST  Result Date: 10/07/2021 CLINICAL DATA:  66 year old male current smoker with 31 pack-year history of smoking. Lung cancer screening examination. EXAM: CT CHEST WITHOUT CONTRAST FOR LUNG CANCER SCREENING NODULE FOLLOW-UP TECHNIQUE: Multidetector CT imaging of the chest was performed following the standard protocol without IV contrast. RADIATION DOSE REDUCTION: This exam was performed according to the departmental dose-optimization program which includes automated exposure control, adjustment of the mA and/or kV  according to patient size and/or use of iterative reconstruction technique. COMPARISON:  Chest CT 02/04/2021. PET-CT 02/10/2021. Low-dose lung cancer screening chest CT 01/21/2021. FINDINGS: Cardiovascular: Heart size is normal. There is no significant pericardial fluid, thickening or pericardial calcification. There is aortic atherosclerosis, as well as atherosclerosis of the great vessels of the mediastinum and the coronary arteries, including calcified atherosclerotic plaque in the left anterior descending coronary artery. Mediastinum/Nodes: No pathologically enlarged mediastinal or hilar lymph nodes. Please note that accurate exclusion of hilar adenopathy is limited on noncontrast CT scans. Esophagus is unremarkable in appearance. No axillary lymphadenopathy. Lungs/Pleura: Resolution of the previously noted mass-like area in the right lower lobe. Linear area of scarring now noted in the right lower lobe. No new suspicious appearing pulmonary nodules or masses are noted. No acute consolidative airspace disease. No pleural effusions. Mild diffuse bronchial wall thickening with mild centrilobular and paraseptal emphysema. Upper Abdomen: Aortic atherosclerosis. Status post cholecystectomy. Surgical clips adjacent to the right lobe of the liver incidentally noted.  Musculoskeletal: There are no aggressive appearing lytic or blastic lesions noted in the visualized portions of the skeleton. IMPRESSION: 1. Lung-RADS 1S, negative. Continue annual screening with low-dose chest CT without contrast in 12 months. 2. The "S" modifier above refers to potentially clinically significant non lung cancer related findings. Specifically, there is aortic atherosclerosis, in addition to left anterior descending coronary artery disease. Please note that although the presence of coronary artery calcium documents the presence of coronary artery disease, the severity of this disease and any potential stenosis cannot be assessed on this non-gated CT examination. Assessment for potential risk factor modification, dietary therapy or pharmacologic therapy may be warranted, if clinically indicated. 3. Mild diffuse bronchial wall thickening with mild centrilobular and paraseptal emphysema; imaging findings suggestive of underlying COPD. Aortic Atherosclerosis (ICD10-I70.0) and Emphysema (ICD10-J43.9). Electronically Signed   By: Vinnie Langton M.D.   On: 10/07/2021 08:00    Assessment & Plan:   Slevin was seen today for annual exam, copd, hypertension, diabetes and hyperlipidemia.  Diagnoses and all orders for this visit:  Primary hypertension- His BP is well controlled. EKG is reassuring. -     CBC with Differential/Platelet; Future -     Basic metabolic panel; Future -     TSH; Future -     EKG 12-Lead -     TSH -     Basic metabolic panel -     CBC with Differential/Platelet  Type II diabetes mellitus with complication ([place])- His blood sugar is well controlled. -     Basic metabolic panel; Future -     Microalbumin / creatinine urine ratio; Future -     Hemoglobin A1c; Future -     Hemoglobin A1c -     Microalbumin / creatinine urine ratio -     Basic metabolic panel -     HM Diabetes Foot Exam  Hyperlipidemia with target LDL less than 100- LDL goal achieved. Doing well on  the statin  -     Lipid panel; Future -     TSH; Future -     Hepatic function panel; Future -     Hepatic function panel -     TSH -     Lipid panel -     simvastatin (ZOCOR) 20 MG tablet; Take 1 tablet (20 mg total) by mouth at bedtime.  Encounter for general adult medical examination with abnormal findings- Exam completed, labs reviewed, vaccines reviewed and updated, cancer screenings addresed,  pt ed material was given.  Benign prostatic hyperplasia with lower urinary tract symptoms, symptom details unspecified- PSA is normal. -     PSA; Future -     Urinalysis, Routine w reflex microscopic; Future -     Urinalysis, Routine w reflex microscopic -     PSA  Need for hepatitis C screening test -     Hepatitis C antibody; Future -     Hepatitis C antibody  Need for DTP vaccine -     Tdap (BOOSTRIX) 5-2.5-18.5 LF-MCG/0.5 injection; Inject 0.5 mLs into the muscle once for 1 dose.  Tachycardia- EKG is reassuring.  Need for vaccination -     Pneumococcal conjugate vaccine 20-valent (Prevnar 20)  Need for shingles vaccine -     Zoster Vaccine Adjuvanted [facility]) injection; Inject 0.5 mLs into the muscle once for 1 dose.  Benign neoplasm of colon, unspecified part of colon -     Ambulatory referral to Gastroenterology  Other orders -     LDL cholesterol, direct   I have discontinued [name]. [place]'s fluvoxaMINE, Trelegy Ellipta, Vitamin D (Ergocalciferol), ALPRAZolam, hydrOXYzine, brexpiprazole, topiramate, metFORMIN, SUMAtriptan, influenza vaccine adjuvanted, and Pfizer COVID-19 Vac Bivalent. I am also having him start on Boostrix and Shingrix. Additionally, I am having him maintain his QUEtiapine, icosapent Ethyl, empagliflozin, Synjardy, and simvastatin.  Meds ordered this encounter  Medications   Tdap (BOOSTRIX) 5-2.5-18.5 LF-MCG/0.5 injection    Sig: Inject 0.5 mLs into the muscle once for 1 dose.    Dispense:  0.5 mL    Refill:  0   Zoster Vaccine Adjuvanted  [facility]) injection    Sig: Inject 0.5 mLs into the muscle once for 1 dose.    Dispense:  0.5 mL    Refill:  1   simvastatin (ZOCOR) 20 MG tablet    Sig: Take 1 tablet (20 mg total) by mouth at bedtime.    Dispense:  90 tablet    Refill:  1     Follow-up: Return in about 6 months (around 05/14/2022).  Scarlette Calico, MD

## 2021-11-11 NOTE — Patient Instructions (Signed)

## 2021-11-12 MED ORDER — SIMVASTATIN 20 MG PO TABS: 20.0000 mg | ORAL_TABLET | Freq: Every day | ORAL | 1 refills | Status: DC

## 2021-11-15 ENCOUNTER — Other Ambulatory Visit: Payer: Self-pay | Admitting: Internal Medicine

## 2021-11-19 DIAGNOSIS — E119 Type 2 diabetes mellitus without complications: Secondary | ICD-10-CM | POA: Diagnosis not present

## 2021-11-19 DIAGNOSIS — H26491 Other secondary cataract, right eye: Secondary | ICD-10-CM | POA: Diagnosis not present

## 2021-11-19 DIAGNOSIS — H524 Presbyopia: Secondary | ICD-10-CM | POA: Diagnosis not present

## 2021-11-19 LAB — HM DIABETES EYE EXAM

## 2021-11-29 DIAGNOSIS — Z79891 Long term (current) use of opiate analgesic: Secondary | ICD-10-CM | POA: Diagnosis not present

## 2021-12-16 ENCOUNTER — Telehealth: Payer: Self-pay | Admitting: Internal Medicine

## 2021-12-16 NOTE — Telephone Encounter (Signed)
N/A unable to leave a message for patient to call back to schedule Medicare Annual Wellness Visit  ? ?No hx of AWV eligible as of 11/11/18 ? ?Please schedule at anytime with [facility] if patient calls the office back.   ? ? ?Any questions, please call me at [phone]  ?

## 2021-12-28 ENCOUNTER — Encounter: Payer: Medicare Other | Admitting: Internal Medicine

## 2021-12-28 ENCOUNTER — Other Ambulatory Visit: Payer: Self-pay | Admitting: Internal Medicine

## 2021-12-28 DIAGNOSIS — E118 Type 2 diabetes mellitus with unspecified complications: Secondary | ICD-10-CM

## 2021-12-29 ENCOUNTER — Other Ambulatory Visit: Payer: Self-pay

## 2021-12-29 ENCOUNTER — Encounter (HOSPITAL_COMMUNITY): Payer: Self-pay | Admitting: Emergency Medicine

## 2021-12-29 ENCOUNTER — Emergency Department (HOSPITAL_COMMUNITY): Payer: Medicare Other

## 2021-12-29 ENCOUNTER — Observation Stay (HOSPITAL_COMMUNITY)
Admission: EM | Admit: 2021-12-29 | Discharge: 2021-12-31 | Disposition: A | Discharge status: Other Acute Inpatient Hospital | Payer: Medicare Other | Attending: Internal Medicine | Admitting: Internal Medicine

## 2021-12-29 DIAGNOSIS — E118 Type 2 diabetes mellitus with unspecified complications: Secondary | ICD-10-CM | POA: Diagnosis not present

## 2021-12-29 DIAGNOSIS — M2578 Osteophyte, vertebrae: Secondary | ICD-10-CM | POA: Diagnosis not present

## 2021-12-29 DIAGNOSIS — T43592A Poisoning by other antipsychotics and neuroleptics, intentional self-harm, initial encounter: Principal | ICD-10-CM | POA: Insufficient documentation

## 2021-12-29 DIAGNOSIS — M47812 Spondylosis without myelopathy or radiculopathy, cervical region: Secondary | ICD-10-CM | POA: Diagnosis not present

## 2021-12-29 DIAGNOSIS — R55 Syncope and collapse: Secondary | ICD-10-CM | POA: Diagnosis not present

## 2021-12-29 DIAGNOSIS — R338 Other retention of urine: Secondary | ICD-10-CM | POA: Diagnosis not present

## 2021-12-29 DIAGNOSIS — Z79899 Other long term (current) drug therapy: Secondary | ICD-10-CM | POA: Insufficient documentation

## 2021-12-29 DIAGNOSIS — Z7984 Long term (current) use of oral hypoglycemic drugs: Secondary | ICD-10-CM | POA: Diagnosis not present

## 2021-12-29 DIAGNOSIS — E119 Type 2 diabetes mellitus without complications: Secondary | ICD-10-CM | POA: Insufficient documentation

## 2021-12-29 DIAGNOSIS — T1491XA Suicide attempt, initial encounter: Secondary | ICD-10-CM | POA: Diagnosis not present

## 2021-12-29 DIAGNOSIS — Z20822 Contact with and (suspected) exposure to covid-19: Secondary | ICD-10-CM | POA: Diagnosis not present

## 2021-12-29 DIAGNOSIS — F17210 Nicotine dependence, cigarettes, uncomplicated: Secondary | ICD-10-CM | POA: Insufficient documentation

## 2021-12-29 DIAGNOSIS — I10 Essential (primary) hypertension: Secondary | ICD-10-CM | POA: Insufficient documentation

## 2021-12-29 DIAGNOSIS — T50902A Poisoning by unspecified drugs, medicaments and biological substances, intentional self-harm, initial encounter: Secondary | ICD-10-CM | POA: Diagnosis present

## 2021-12-29 DIAGNOSIS — F329 Major depressive disorder, single episode, unspecified: Secondary | ICD-10-CM | POA: Diagnosis not present

## 2021-12-29 DIAGNOSIS — R4182 Altered mental status, unspecified: Secondary | ICD-10-CM | POA: Diagnosis present

## 2021-12-29 DIAGNOSIS — M4602 Spinal enthesopathy, cervical region: Secondary | ICD-10-CM | POA: Diagnosis not present

## 2021-12-29 DIAGNOSIS — Z743 Need for continuous supervision: Secondary | ICD-10-CM | POA: Diagnosis not present

## 2021-12-29 DIAGNOSIS — E785 Hyperlipidemia, unspecified: Secondary | ICD-10-CM | POA: Diagnosis not present

## 2021-12-29 DIAGNOSIS — G319 Degenerative disease of nervous system, unspecified: Secondary | ICD-10-CM | POA: Diagnosis not present

## 2021-12-29 DIAGNOSIS — F32A Depression, unspecified: Secondary | ICD-10-CM

## 2021-12-29 DIAGNOSIS — G459 Transient cerebral ischemic attack, unspecified: Secondary | ICD-10-CM | POA: Diagnosis not present

## 2021-12-29 DIAGNOSIS — R339 Retention of urine, unspecified: Secondary | ICD-10-CM | POA: Insufficient documentation

## 2021-12-29 DIAGNOSIS — R000 Tachycardia, unspecified: Secondary | ICD-10-CM | POA: Diagnosis not present

## 2021-12-29 DIAGNOSIS — D72829 Elevated white blood cell count, unspecified: Secondary | ICD-10-CM | POA: Diagnosis not present

## 2021-12-29 LAB — URINALYSIS, ROUTINE W REFLEX MICROSCOPIC
Bilirubin Urine: NEGATIVE
Glucose, UA: 150 mg/dL — AB
Hgb urine dipstick: NEGATIVE
Ketones, ur: 20 mg/dL — AB
Leukocytes,Ua: NEGATIVE
Nitrite: NEGATIVE
Protein, ur: 30 mg/dL — AB
Specific Gravity, Urine: 1.02 (ref 1.005–1.030)
pH: 5 (ref 5.0–8.0)

## 2021-12-29 LAB — CBC WITH DIFFERENTIAL/PLATELET
Abs Immature Granulocytes: 0.06 10*3/uL (ref 0.00–0.07)
Basophils Absolute: 0 10*3/uL (ref 0.0–0.1)
Basophils Relative: 0 %
Eosinophils Absolute: 0 10*3/uL (ref 0.0–0.5)
Eosinophils Relative: 0 %
HCT: 47.2 % (ref 39.0–52.0)
Hemoglobin: 16 g/dL (ref 13.0–17.0)
Immature Granulocytes: 1 %
Lymphocytes Relative: 8 %
Lymphs Abs: 1 10*3/uL (ref 0.7–4.0)
MCH: 32.3 pg (ref 26.0–34.0)
MCHC: 33.9 g/dL (ref 30.0–36.0)
MCV: 95.2 fL (ref 80.0–100.0)
Monocytes Absolute: 0.7 10*3/uL (ref 0.1–1.0)
Monocytes Relative: 6 %
Neutro Abs: 10.6 10*3/uL — ABNORMAL HIGH (ref 1.7–7.7)
Neutrophils Relative %: 85 %
Platelets: 287 10*3/uL (ref 150–400)
RBC: 4.96 MIL/uL (ref 4.22–5.81)
RDW: 12.8 % (ref 11.5–15.5)
WBC: 12.4 10*3/uL — ABNORMAL HIGH (ref 4.0–10.5)
nRBC: 0 % (ref 0.0–0.2)

## 2021-12-29 LAB — COMPREHENSIVE METABOLIC PANEL
ALT: 18 U/L (ref 0–44)
AST: 16 U/L (ref 15–41)
Albumin: 3.8 g/dL (ref 3.5–5.0)
Alkaline Phosphatase: 69 U/L (ref 38–126)
Anion gap: 10 (ref 5–15)
BUN: 12 mg/dL (ref 8–23)
CO2: 22 mmol/L (ref 22–32)
Calcium: 8.9 mg/dL (ref 8.9–10.3)
Chloride: 104 mmol/L (ref 98–111)
Creatinine, Ser: 1.21 mg/dL (ref 0.61–1.24)
GFR, Estimated: >60 mL/min (ref >60)
Glucose, Bld: 165 mg/dL — ABNORMAL HIGH (ref 70–99)
Potassium: 3.6 mmol/L (ref 3.5–5.1)
Sodium: 136 mmol/L (ref 135–145)
Total Bilirubin: 0.7 mg/dL (ref 0.3–1.2)
Total Protein: 6.7 g/dL (ref 6.5–8.1)

## 2021-12-29 LAB — LACTIC ACID, PLASMA
Lactic Acid, Venous: 2 mmol/L (ref 0.5–1.9)
Lactic Acid, Venous: 1.5 mmol/L (ref 0.5–1.9)

## 2021-12-29 LAB — I-STAT VENOUS BLOOD GAS, ED
Acid-base deficit: 3 mmol/L — ABNORMAL HIGH (ref 0.0–2.0)
Bicarbonate: 21.2 mmol/L (ref 20.0–28.0)
Calcium, Ion: 1.02 mmol/L — ABNORMAL LOW (ref 1.15–1.40)
HCT: 47 % (ref 39.0–52.0)
Hemoglobin: 16 g/dL (ref 13.0–17.0)
O2 Saturation: 97 %
Potassium: 3.6 mmol/L (ref 3.5–5.1)
Sodium: 136 mmol/L (ref 135–145)
TCO2: 22 mmol/L (ref 22–32)
pCO2, Ven: 35.3 mmHg — ABNORMAL LOW (ref 44–60)
pH, Ven: 7.387 (ref 7.25–7.43)
pO2, Ven: 94 mmHg — ABNORMAL HIGH (ref 32–45)

## 2021-12-29 LAB — RAPID URINE DRUG SCREEN, HOSP PERFORMED
Amphetamines: NOT DETECTED
Barbiturates: NOT DETECTED
Benzodiazepines: NOT DETECTED
Cocaine: NOT DETECTED
Opiates: NOT DETECTED
Tetrahydrocannabinol: NOT DETECTED

## 2021-12-29 LAB — MAGNESIUM: Magnesium: 2 mg/dL (ref 1.7–2.4)

## 2021-12-29 LAB — CBG MONITORING, ED: Glucose-Capillary: 166 mg/dL — ABNORMAL HIGH (ref 70–99)

## 2021-12-29 LAB — SALICYLATE LEVEL: Salicylate Lvl: <7 mg/dL — ABNORMAL LOW (ref 7.0–30.0)

## 2021-12-29 LAB — ACETAMINOPHEN LEVEL: Acetaminophen (Tylenol), Serum: <10 ug/mL — ABNORMAL LOW (ref 10–30)

## 2021-12-29 LAB — AMMONIA: Ammonia: 40 umol/L — ABNORMAL HIGH (ref 9–35)

## 2021-12-29 LAB — ETHANOL: Alcohol, Ethyl (B): <10 mg/dL (ref <10)

## 2021-12-29 MED ORDER — SODIUM CHLORIDE 0.9 % IV SOLN: INTRAVENOUS | Status: AC

## 2021-12-29 MED ORDER — TAMSULOSIN HCL 0.4 MG PO CAPS
0.4000 mg | ORAL_CAPSULE | Freq: Every day | ORAL | Status: DC
Start: 2021-12-29 — End: 2021-12-31
  Administered 2021-12-29 – 2021-12-31 (×3): 0.4 mg via ORAL
  Filled 2021-12-29 (×3): qty 1

## 2021-12-29 MED ORDER — SODIUM CHLORIDE 0.9 % IV BOLUS
1000.0000 mL | Freq: Once | INTRAVENOUS | Status: AC
  Administered 2021-12-29: 1000 mL via INTRAVENOUS

## 2021-12-29 MED ORDER — SODIUM CHLORIDE 0.9% FLUSH
3.0000 mL | Freq: Two times a day (BID) | INTRAVENOUS | Status: DC
  Administered 2021-12-29 – 2021-12-31 (×4): 3 mL via INTRAVENOUS

## 2021-12-29 MED ORDER — ACETAMINOPHEN 325 MG PO TABS: 650.0000 mg | ORAL_TABLET | Freq: Four times a day (QID) | ORAL | Status: DC | PRN

## 2021-12-29 MED ORDER — ENOXAPARIN SODIUM 40 MG/0.4ML IJ SOSY
40.0000 mg | PREFILLED_SYRINGE | INTRAMUSCULAR | Status: DC
  Administered 2021-12-29 – 2021-12-30 (×2): 40 mg via SUBCUTANEOUS
  Filled 2021-12-29 (×2): qty 0.4

## 2021-12-29 MED ORDER — INSULIN ASPART 100 UNIT/ML IJ SOLN
0.0000 [IU] | Freq: Three times a day (TID) | INTRAMUSCULAR | Status: DC
  Administered 2021-12-30: 3 [IU] via SUBCUTANEOUS
  Administered 2021-12-31: 2 [IU] via SUBCUTANEOUS

## 2021-12-29 MED ORDER — NICOTINE 21 MG/24HR TD PT24
21.0000 mg | MEDICATED_PATCH | Freq: Every day | TRANSDERMAL | Status: DC
  Administered 2021-12-29 – 2021-12-31 (×3): 21 mg via TRANSDERMAL
  Filled 2021-12-29 (×3): qty 1

## 2021-12-29 MED ORDER — ACETAMINOPHEN 650 MG RE SUPP: 650.0000 mg | Freq: Four times a day (QID) | RECTAL | Status: DC | PRN

## 2021-12-29 NOTE — ED Provider Notes (Signed)
?[facility] ?Provider Note ? ? ?CSN: [id number] ?Arrival date & time: 12/29/21  1050 ? ?  ? ?History ? ?Chief Complaint  ?Patient presents with  ? Altered Mental Status  ? ? ?Jeremy Shannon is a 66 y.o. male. ? ?HPI ?I saw the patient at 1245 because he was being brought back from triage.  He had arrived earlier, with concern for overdose by EMS.  Daughter is with him now.  It is unclear what he might of ingested but it is suspected that he took some of his home medications.  He has had a prior suicide attempt.  This morning around 10 AM, the daughter found him face down on the floor in the living room.  She had last seen him last night.  She lives in the home with her father and her mother.  Patient's wife is currently out of town and a [employer] manager.  Daughter found a suicide note indicating that the patient was distraught over prior feelings and repeatedly apologized to different people.  It appears that the note was written over a period of time, with several several different entries.  Each subsequent injury was more scattered and less legible.  Patient's daughter found him with his cell phone open as if he might of been trying to make a call. ? ?According to triage nurse the patient has become somewhat more responsive since arrival. ?  ? ?Home Medications ?Prior to Admission medications   ?Medication Sig Start Date End Date Taking? Authorizing Provider  ?empagliflozin (JARDIANCE) 10 MG TABS tablet Take 10 mg by mouth daily.    [provider]  ?hydrOXYzine (VISTARIL) 25 MG capsule Take 25 mg by mouth once a week. 10/25/21   [provider]  ?icosapent Ethyl (VASCEPA) 1 g capsule Take 2 capsules (2 g total) by mouth 2 (two) times daily. 11/12/20   [name], MD  ?metFORMIN (GLUCOPHAGE) 500 MG tablet Take 500 mg by mouth 2 (two) times daily. 11/16/21   [provider]  ?Oxcarbazepine (TRILEPTAL) 300 MG tablet Take 300 mg by mouth 3  (three) times daily. 12/16/21   [provider]  ?QUEtiapine (SEROQUEL) 100 MG tablet Take 300 mg by mouth at bedtime.     [provider]  ?simvastatin (ZOCOR) 20 MG tablet Take 1 tablet (20 mg total) by mouth at bedtime. 11/12/21   [name], MD  ?simvastatin (ZOCOR) 40 MG tablet Take 40 mg by mouth at bedtime. 10/05/21   [provider]  ?SYNJARDY 5-500 MG TABS TAKE 1 TABLET BY MOUTH TWICE DAILY 05/19/21   [name], MD  ?   ? ?Allergies    ?Patient has no known allergies.   ? ?Review of Systems   ?Review of Systems ? ?Physical Exam ?Updated Vital Signs ?BP 129/85 (BP Location: Left Arm)   Pulse 95   Temp 98.5 ?F (36.9 ?C) (Oral)   Resp 15   Ht '5\' 8"'$  (1.727 m)   Wt 94.8 kg   SpO2 98%   BMI 31.78 kg/m?  ?Physical Exam ?Vitals and nursing note reviewed.  ?Constitutional:   ?   General: He is in acute distress.  ?   Appearance: He is well-developed. He is not ill-appearing.  ?HENT:  ?   Head: Normocephalic and atraumatic.  ?   Right Ear: External ear normal.  ?   Left Ear: External ear normal.  ?Eyes:  ?   General: No scleral icterus. ?  Conjunctiva/sclera: Conjunctivae normal.  ?   Comments: Pupils 2 mm and slightly reactive bilaterally.  No nystagmus.  ?Neck:  ?   Trachea: Phonation normal.  ?Cardiovascular:  ?   Rate and Rhythm: Normal rate and regular rhythm.  ?   Heart sounds: Normal heart sounds.  ?Pulmonary:  ?   Effort: Pulmonary effort is normal.  ?   Breath sounds: Normal breath sounds.  ?Abdominal:  ?   Palpations: Abdomen is soft.  ?   Tenderness: There is no abdominal tenderness.  ?Musculoskeletal:     ?   General: Normal range of motion.  ?   Cervical back: Normal range of motion and neck supple.  ?Skin: ?   General: Skin is warm and dry.  ?Neurological:  ?   Mental Status: He is lethargic.  ?   GCS: GCS eye subscore is 3. GCS verbal subscore is 2. GCS motor subscore is 5.  ?   Motor: No abnormal muscle tone.  ?   Coordination: Coordination normal.  ?Psychiatric:   ?   Comments: Lethargic  ? ? ?ED Results / Procedures / Treatments   ?Labs ?(all labs ordered are listed, but only abnormal results are displayed) ?Labs Reviewed  ?COMPREHENSIVE METABOLIC PANEL - Abnormal; Notable for the following components:  ?    Result Value  ? Glucose, Bld 165 (*)   ? All other components within normal limits  ?ACETAMINOPHEN LEVEL - Abnormal; Notable for the following components:  ? Acetaminophen (Tylenol), Serum <10 (*)   ? All other components within normal limits  ?SALICYLATE LEVEL - Abnormal; Notable for the following components:  ? Salicylate Lvl <9.4 (*)   ? All other components within normal limits  ?CBC WITH DIFFERENTIAL/PLATELET - Abnormal; Notable for the following components:  ? WBC 12.4 (*)   ? Neutro Abs 10.6 (*)   ? All other components within normal limits  ?LACTIC ACID, PLASMA - Abnormal; Notable for the following components:  ? Lactic Acid, Venous 2.0 (*)   ? All other components within normal limits  ?AMMONIA - Abnormal; Notable for the following components:  ? Ammonia 40 (*)   ? All other components within normal limits  ?CBG MONITORING, ED - Abnormal; Notable for the following components:  ? Glucose-Capillary 166 (*)   ? All other components within normal limits  ?I-STAT VENOUS BLOOD GAS, ED - Abnormal; Notable for the following components:  ? pCO2, Ven 35.3 (*)   ? pO2, Ven 94 (*)   ? Acid-base deficit 3.0 (*)   ? Calcium, Ion 1.02 (*)   ? All other components within normal limits  ?ETHANOL  ?LACTIC ACID, PLASMA  ?MAGNESIUM  ?URINALYSIS, ROUTINE W REFLEX MICROSCOPIC  ?RAPID URINE DRUG SCREEN, HOSP PERFORMED  ?BLOOD GAS, VENOUS  ? ? ?EKG ?EKG Interpretation ? ?Date/Time:  Wednesday December 29 2021 10:42:49 EDT ?Ventricular Rate:  115 ?PR Interval:  152 ?QRS Duration: 96 ?QT Interval:  338 ?QTC Calculation: 467 ?R Axis:   22 ?Text Interpretation: Sinus tachycardia Nonspecific T wave abnormality Abnormal ECG When compared with ECG of 23-Sep-2021 14:47, PREVIOUS ECG IS PRESENT  Since last tracing rate faster Otherwise no significant change Confirmed by Daleen Bo [id number]) on 12/29/2021 1:11:35 PM ? ? EKG Interpretation ? ?Date/Time:  Wednesday December 29 2021 12:58:11 EDT ?Ventricular Rate:  104 ?PR Interval:  170 ?QRS Duration: 96 ?QT Interval:  344 ?QTC Calculation: 452 ?R Axis:   14 ?Text Interpretation: Sinus tachycardia Nonspecific T wave abnormality Abnormal ECG When compared  with ECG of 29-Dec-2021 10:42, PREVIOUS ECG IS PRESENT Since last tracing QTc is shorter Confirmed by Daleen Bo [phone]) on 12/29/2021 5:06:45 PM ?  ? ?  ? ? ? ? ?Radiology ?CT Head Wo Contrast ? ?Result Date: 12/29/2021 ?CLINICAL DATA:  Found down. EXAM: CT HEAD WITHOUT CONTRAST CT CERVICAL SPINE WITHOUT CONTRAST TECHNIQUE: Multidetector CT imaging of the head and cervical spine was performed following the standard protocol without intravenous contrast. Multiplanar CT image reconstructions of the cervical spine were also generated. RADIATION DOSE REDUCTION: This exam was performed according to the departmental dose-optimization program which includes automated exposure control, adjustment of the mA and/or kV according to patient size and/or use of iterative reconstruction technique. COMPARISON:  Head CT 03/26/2021 and prior cervical spine MRI 03/14/2021 FINDINGS: CT HEAD FINDINGS Brain: Stable severe age advanced cerebral atrophy. The ventricles are in the midline without mass effect or shift. They are normal in size and configuration. No CT findings to suggest an acute hemispheric infarction or intracranial hemorrhage. No mass lesions. The brainstem and cerebellum are grossly normal. Vascular: Stable mild vascular calcifications but no aneurysm or hyperdense vessels. Skull: No acute skull fracture or bone lesions. Sinuses/Orbits: The paranasal sinuses and mastoid air cells are grossly clear. The globes are intact. Other: No scalp lesions or scalp hematoma. CT CERVICAL SPINE FINDINGS Alignment: Normal Skull  base and vertebrae: No acute fracture. No primary bone lesion or focal pathologic process. Soft tissues and spinal canal: No prevertebral fluid or swelling. No visible canal hematoma. Disc levels: Stable degenerati

## 2021-12-29 NOTE — ED Notes (Signed)
Pt and family updated. Pt alert, NAD, calm, interactive, resps e/u, some confusion and increased agitation, and restlessness noted. Pending urine.  ?

## 2021-12-29 NOTE — ED Notes (Signed)
Patients stepdaughter here in triage tearful stating she found a suicide note from patient.  ?

## 2021-12-29 NOTE — ED Notes (Signed)
Pt calmed at this time.  ?

## 2021-12-29 NOTE — ED Notes (Signed)
Bladder Scan 745 ml ?

## 2021-12-29 NOTE — ED Provider Triage Note (Addendum)
Emergency Medicine Provider Triage Evaluation Note ? ?Jeremy Shannon , a 66 y.o. male  was evaluated in triage.  Pt complains of altered mental status.  Last known well last night at 830 PM.  Patient has intermittently somnolent on exam.  Intermittently snoring.  Difficult to arouse.  Nonverbal.  Has left-sided weakness.  Pupils are 3 mm and reactive bilaterally ?Significant other states that she found suicide note from patient. ? ?Addendum : Stepdaughter found him on the floor this morning at around 950.  States that he was laying facedown on the floor and not very verbal.  She states that he has done this before.  States that he took unknown amount of Xanax last summer. ?Review of Systems  ?Positive: See above ?Negative:  ?Physical Exam  ?BP 108/74   Pulse (!) 114   Temp 98.5 ?F (36.9 ?C) (Oral)   Resp 12   Ht '5\' 8"'$  (1.727 m)   Wt 94.8 kg   SpO2 99% Comment: Simultaneous filing. User may not have seen previous data.  BMI 31.78 kg/m?  ?Gen:   Awake, distress ?Resp:  Normal effort, clear bilaterally ?MSK:   Weakness to the right arm ?Other:  Slight facial droop, nonverbal ? ?Medical Decision Making  ?Medically screening exam initiated at 10:56 AM.  Appropriate orders placed.  Jeremy Shannon was informed that the remainder of the evaluation will be completed by another provider, this initial triage assessment does not replace that evaluation, and the importance of remaining in the ED until their evaluation is complete. ? ?Needs next room.  Triage RN made aware. ?  ?Jeremy Hillier, PA-C ?12/29/21 1058 ? ?  ?Jeremy Hillier, PA-C ?12/29/21 1100 ? ?

## 2021-12-29 NOTE — H&P (Addendum)
?History and Physical  ? ?Jeremy Shannon JGO:[id number] DOB: 10/22/1955 DOA: 12/29/2021 ? ?PCP: [name], MD  ? ?Patient coming from: Home ? ?Chief Complaint: Altered mental status, suicide attempt ? ?HPI: Jeremy Shannon is a 66 y.o. male with medical history significant of diabetes, hyperlipidemia, neuropathy, chronic bronchitis, hypertension, parkinsonian features, anxiety, depression presenting with altered mental status with concern for overdose. ? ?History obtained with assistance of family and chart review due to patient's altered mentation.  Patient was found down at home around 10 AM by his daughter.  Was found in the living room.  Daughter lives with him and her mother as well.  Mother/patient's wife is currently out of town.  Patient has known history of prior suicide attempt and did leave a suicide note.  Daughter states that note has multiple injuries over a period of time which each entry appearing more scattered and less legible than the prior. ? ?Suspected to be a overdose of Seroquel.  Patient became slightly more alert in the ED but not yet oriented and unable to participate in exam by psychiatry and unable to be medically cleared.  Per family the only known medication that he currently has access to his Seroquel, chart review shows he is also prescribed medications for hyperlipidemia and diabetes, but glucose remains normal and other labs are stable as below. ? ?Patient denies fevers, chills, chest pain, shortness of breath, abdominal pain, constipation, diarrhea, nausea, vomiting. ? ?ED Course: Vital signs in the ED significant for heart rate in the 90s to 100s, blood pressure in the 559R to 416L systolic.  Lab work-up shows CMP with glucose 165.  Magnesium normal.  CBC with mild leukocytosis to 12.4.  Lactic acid 2 and then improved to normal on repeat.  Tylenol level, aspirin level, ethanol level, UDS all normal.  Ammonia level mildly elevated at 40.  Urinalysis showed ketones,  protein, rare bacteria.  VBG showed normal pH and mildly low PCO2 at 35.3.  Patient received a liter of IV fluids.  Case was discussed with poison control who recommended EKGs and monitoring of QTc.  QTc is remained stable in the ED.  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  Patient was noted to have some urinary retention as well in the ED. ? ?Review of Systems: As per HPI otherwise all other systems reviewed and are negative. ? ?Past Medical History:  ?Diagnosis Date  ? Anemia   ? pt denies  ? Anxiety   ? Cataract   ? Depression   ? Diabetes mellitus without complication ([place])   ? type 2  ? Dyspnea   ? Hyperlipidemia   ? PONV (postoperative nausea and vomiting)   ? ? ?Past Surgical History:  ?Procedure Laterality Date  ? BRONCHIAL BIOPSY  02/16/2021  ? Procedure: BRONCHIAL BIOPSIES;  Surgeon: Garner Nash, DO;  Location: [facility];  Service: Pulmonary;;  ? BRONCHIAL BRUSHINGS  02/16/2021  ? Procedure: BRONCHIAL BRUSHINGS;  Surgeon: Garner Nash, DO;  Location: [facility];  Service: Pulmonary;;  ? BRONCHIAL NEEDLE ASPIRATION BIOPSY  02/16/2021  ? Procedure: BRONCHIAL NEEDLE ASPIRATION BIOPSIES;  Surgeon: Garner Nash, DO;  Location: [facility];  Service: Pulmonary;;  ? BRONCHIAL WASHINGS  02/16/2021  ? Procedure: BRONCHIAL WASHINGS;  Surgeon: Garner Nash, DO;  Location: [facility];  Service: Pulmonary;;  ? CATARACT EXTRACTION    ? bilateral  ?  CHOLECYSTECTOMY N/A 02/10/2017  ? Procedure: LAPAROSCOPIC CHOLECYSTECTOMY WITH INTRAOPERATIVE CHOLANGIOGRAM;  Surgeon: Alphonsa Overall, MD;  [facility];  Service: General;  Laterality: N/A;  ? COLONOSCOPY    ? FIDUCIAL MARKER PLACEMENT  02/16/2021  ? Procedure: FIDUCIAL MARKER PLACEMENT;  Surgeon: Garner Nash, DO;  Location: [facility];  Service: Pulmonary;;  ? VIDEO BRONCHOSCOPY WITH ENDOBRONCHIAL NAVIGATION Right  02/16/2021  ? Procedure: VIDEO BRONCHOSCOPY WITH ENDOBRONCHIAL NAVIGATION;  Surgeon: Garner Nash, DO;  Location: [facility];  Service: Pulmonary;  Laterality: Right;  ? ? ?Social History ? reports that he has been smoking cigarettes. He has a 43.00 pack-year smoking history. He has never used smokeless tobacco. He reports that he does not drink alcohol and does not use drugs. ? ?No Known Allergies ? ?Family History  ?Problem Relation Age of Onset  ? Liver cancer Mother   ? Depression Father   ? Drug abuse Father   ?Reviewed on admission ? ?Prior to Admission medications   ?Medication Sig Start Date End Date Taking? Authorizing Provider  ?empagliflozin (JARDIANCE) 10 MG TABS tablet Take 10 mg by mouth daily.   Yes [provider]  ?icosapent Ethyl (VASCEPA) 1 g capsule Take 2 capsules (2 g total) by mouth 2 (two) times daily. ?Patient taking differently: Take 1 g by mouth 2 (two) times daily. 11/12/20  Yes [name], MD  ?QUEtiapine (SEROQUEL) 200 MG tablet Take 200 mg by mouth in the morning.   Yes [provider]  ?QUEtiapine (SEROQUEL) 300 MG tablet Take 300 mg by mouth at bedtime.   Yes [provider]  ?simvastatin (ZOCOR) 20 MG tablet Take 1 tablet (20 mg total) by mouth at bedtime. 11/12/21  Yes [name], MD  ?SYNJARDY 5-500 MG TABS TAKE 1 TABLET BY MOUTH TWICE DAILY ?Patient not taking: Reported on 12/29/2021 05/19/21   [name], MD  ? ? ?Physical Exam: ?Vitals:  ? 12/29/21 1800 12/29/21 1815 12/29/21 1830 12/29/21 1845  ?BP: (!) 147/78 (!) 142/87 (!) 134/97 (!) 148/94  ?Pulse: 95 94 97 97  ?Resp: 18 16 (!) 21 14  ?Temp:      ?TempSrc:      ?SpO2: 96% 97% 97% 97%  ?Weight:      ?Height:      ? ? ?Physical Exam ?Constitutional:   ?   General: He is not in acute distress. ?   Appearance: Normal appearance.  ?HENT:  ?   Head: Normocephalic and atraumatic.  ?   Mouth/Throat:  ?   Mouth: Mucous membranes are moist.  ?   Pharynx: Oropharynx is clear.  ?Eyes:  ?    Extraocular Movements: Extraocular movements intact.  ?   Pupils: Pupils are equal, round, and reactive to light.  ?Cardiovascular:  ?   Rate and Rhythm: Normal rate and regular rhythm.  ?   Pulses: Normal pulses.  ?   Heart sounds: Normal heart sounds.  ?Pulmonary:  ?   Effort: Pulmonary effort is normal. No respiratory distress.  ?   Breath sounds: Normal breath sounds.  ?Abdominal:  ?   General: Bowel sounds are normal. There is no distension.  ?   Palpations: Abdomen is soft.  ?   Tenderness: There is no abdominal tenderness.  ?Musculoskeletal:     ?   General: No swelling or deformity.  ?Skin: ?   General: Skin is warm and dry.  ?Neurological:  ?   General: No focal deficit present.  ?  Mental Status: He is disoriented.  ? ?Labs on Admission: I have personally reviewed following labs and imaging studies ? ?CBC: ?Recent Labs  ?Lab 12/29/21 ?1105 12/29/21 ?1239  ?WBC 12.4*  --   ?NEUTROABS 10.6*  --   ?HGB 16.0 16.0  ?HCT 47.2 47.0  ?MCV 95.2  --   ?PLT 287  --   ? ? ?Basic Metabolic Panel: ?Recent Labs  ?Lab 12/29/21 ?1105 12/29/21 ?1239  ?NA 136 136  ?K 3.6 3.6  ?CL 104  --   ?CO2 22  --   ?GLUCOSE 165*  --   ?BUN 12  --   ?CREATININE 1.21  --   ?CALCIUM 8.9  --   ?MG 2.0  --   ? ? ?GFR: ?Estimated Creatinine Clearance: 65.3 mL/min (by C-G formula based on SCr of 1.21 mg/dL). ? ?Liver Function Tests: ?Recent Labs  ?Lab 12/29/21 ?1105  ?AST 16  ?ALT 18  ?ALKPHOS 69  ?BILITOT 0.7  ?PROT 6.7  ?ALBUMIN 3.8  ? ? ?Urine analysis: ?   ?Component Value Date/Time  ? [place] YELLOW 12/29/2021 1750  ? APPEARANCEUR CLEAR 12/29/2021 1750  ? LABSPEC 1.020 12/29/2021 1750  ? PHURINE 5.0 12/29/2021 1750  ? GLUCOSEU 150 (A) 12/29/2021 1750  ? GLUCOSEU >=1000 (A) 11/11/2021 1552  ? [place] NEGATIVE 12/29/2021 1750  ? [place] NEGATIVE 12/29/2021 1750  ? KETONESUR 20 (A) 12/29/2021 1750  ? PROTEINUR 30 (A) 12/29/2021 1750  ? UROBILINOGEN 0.2 11/11/2021 1552  ? NITRITE NEGATIVE 12/29/2021 1750  ? LEUKOCYTESUR NEGATIVE  12/29/2021 1750  ? ? ?Radiological Exams on Admission: ?CT Head Wo Contrast ? ?Result Date: 12/29/2021 ?CLINICAL DATA:  Found down. EXAM: CT HEAD WITHOUT CONTRAST CT CERVICAL SPINE WITHOUT CONTRAST TECHNIQUE: Multidetector

## 2021-12-29 NOTE — ED Notes (Addendum)
[facility] called for update. Closing case on their end. Verbalizes benzos if needed. EDP notified.  ?

## 2021-12-29 NOTE — ED Provider Notes (Signed)
Care was taken over from Dr. Eulis Foster.  Patient had a presumed overdose of Seroquel.  Per his wife, she does not know of any other medications that he would have had access to.  He did write a suicide note which his wife has in her possession.  He was awaiting to be medically cleared.  They have been pretty sedate on arrival.  On my exam, he was more alert but still a bit disoriented.  He was monitored for couple more hours.  He is awake and talking but still a bit confused.  At this point we still cannot medically clear him.  I feel that since he has been here for over 8 hours, at this point it is reasonable to admit him for further observation.  IVC papers were initiated.  His EKG does not have a prolonged QT interval.  His other labs are nonconcerning.  Of note he did have urinary retention and required in and out catheter.  He does not have any focal neurologic deficits.  He had a head CT which showed no acute abnormalities.  I spoke with Dr. Trilby Drummer with the hospitalist service he will admit the patient for further treatment. ?  Malvin Johns, MD ?12/29/21 2151 ? ?

## 2021-12-29 NOTE — ED Notes (Signed)
EDP in to see, update.  ?

## 2021-12-29 NOTE — ED Notes (Signed)
Introduce self to pt, pt remains on cardiac monitor, pt resting, voiced no needs at this time, sitter at the bedside for safety measures, pt given urinal for elimination. Pt reposition in bed for comfort. POC ongoing at this time, will continue to monitor. ?

## 2021-12-29 NOTE — ED Notes (Signed)
Date and time results received: 12/29/21 1335  ?(use smartphrase ".now" to insert current time) ? ?Test: Lactic Acid  ?Critical Value: 2.0  ? ?Name of Provider Notified: Dr. Crosby Oyster ? ?Orders Received? Or Actions Taken?: Actions Taken: repeat Lactic Acid test ?

## 2021-12-29 NOTE — ED Notes (Signed)
Med rec tech speaking with pt's wife ?

## 2021-12-29 NOTE — ED Notes (Signed)
3 ivc copies handed to [place] M ?

## 2021-12-29 NOTE — ED Notes (Addendum)
RN attempted in and out cath without success. EDP and Admitting notified. RN locate appropriate staff for [place] cath. Pt continues to be agitated, pulling at wires, cloths, attempting to get out of bed. Agitation likely related to urine retention, RN will notify admitting if agitation continues/increases after bladder emptied. Sitter at bedside. ?

## 2021-12-29 NOTE — ED Triage Notes (Signed)
Per [facility] pt coming from home- last seen normal by stepdaughter at 8:30 pm last night and found patient in floor at 9:50 this morning. Right sided facial droop and slurred speech and AMS.  18G LAC. 100CC NS given en route.  ?

## 2021-12-30 ENCOUNTER — Telehealth: Payer: Self-pay

## 2021-12-30 ENCOUNTER — Ambulatory Visit: Payer: Medicare Other | Admitting: Internal Medicine

## 2021-12-30 DIAGNOSIS — F332 Major depressive disorder, recurrent severe without psychotic features: Secondary | ICD-10-CM

## 2021-12-30 DIAGNOSIS — T50902A Poisoning by unspecified drugs, medicaments and biological substances, intentional self-harm, initial encounter: Secondary | ICD-10-CM | POA: Diagnosis not present

## 2021-12-30 DIAGNOSIS — R338 Other retention of urine: Secondary | ICD-10-CM | POA: Diagnosis not present

## 2021-12-30 DIAGNOSIS — T50902D Poisoning by unspecified drugs, medicaments and biological substances, intentional self-harm, subsequent encounter: Secondary | ICD-10-CM

## 2021-12-30 LAB — COMPREHENSIVE METABOLIC PANEL
ALT: 16 U/L (ref 0–44)
AST: 17 U/L (ref 15–41)
Albumin: 3.6 g/dL (ref 3.5–5.0)
Alkaline Phosphatase: 64 U/L (ref 38–126)
Anion gap: 8 (ref 5–15)
BUN: 8 mg/dL (ref 8–23)
CO2: 20 mmol/L — ABNORMAL LOW (ref 22–32)
Calcium: 8.2 mg/dL — ABNORMAL LOW (ref 8.9–10.3)
Chloride: 109 mmol/L (ref 98–111)
Creatinine, Ser: 1.03 mg/dL (ref 0.61–1.24)
GFR, Estimated: >60 mL/min (ref >60)
Glucose, Bld: 97 mg/dL (ref 70–99)
Potassium: 3.6 mmol/L (ref 3.5–5.1)
Sodium: 137 mmol/L (ref 135–145)
Total Bilirubin: 0.7 mg/dL (ref 0.3–1.2)
Total Protein: 6 g/dL — ABNORMAL LOW (ref 6.5–8.1)

## 2021-12-30 LAB — GLUCOSE, CAPILLARY
Glucose-Capillary: 168 mg/dL — ABNORMAL HIGH (ref 70–99)
Glucose-Capillary: 109 mg/dL — ABNORMAL HIGH (ref 70–99)
Glucose-Capillary: 103 mg/dL — ABNORMAL HIGH (ref 70–99)

## 2021-12-30 LAB — CBC
HCT: 45.4 % (ref 39.0–52.0)
Hemoglobin: 15.8 g/dL (ref 13.0–17.0)
MCH: 32.2 pg (ref 26.0–34.0)
MCHC: 34.8 g/dL (ref 30.0–36.0)
MCV: 92.5 fL (ref 80.0–100.0)
Platelets: 303 10*3/uL (ref 150–400)
RBC: 4.91 MIL/uL (ref 4.22–5.81)
RDW: 12.9 % (ref 11.5–15.5)
WBC: 10 10*3/uL (ref 4.0–10.5)
nRBC: 0 % (ref 0.0–0.2)

## 2021-12-30 LAB — RESP PANEL BY RT-PCR (FLU A&B, COVID) ARPGX2
Influenza A by PCR: NEGATIVE
Influenza B by PCR: NEGATIVE
SARS Coronavirus 2 by RT PCR: NEGATIVE

## 2021-12-30 LAB — CBG MONITORING, ED
Glucose-Capillary: 89 mg/dL (ref 70–99)
Glucose-Capillary: 94 mg/dL (ref 70–99)

## 2021-12-30 MED ORDER — ALPRAZOLAM 0.25 MG PO TABS
0.5000 mg | ORAL_TABLET | Freq: Three times a day (TID) | ORAL | Status: DC | PRN
  Administered 2021-12-30 – 2021-12-31 (×3): 0.5 mg via ORAL
  Filled 2021-12-30 (×3): qty 2

## 2021-12-30 MED ORDER — EMPAGLIFLOZIN 10 MG PO TABS
10.0000 mg | ORAL_TABLET | Freq: Every day | ORAL | Status: DC
  Administered 2021-12-31: 10 mg via ORAL
  Filled 2021-12-30 (×2): qty 1

## 2021-12-30 NOTE — ED Notes (Signed)
Pt appears to be resting with eyes closed, even & unlabored RR, skin warm and dry, safety in place, sitter at the bedside for safety. POC on-going, will continue to monitor.  ?

## 2021-12-30 NOTE — ED Notes (Signed)
Pt able to answer questions approprietly at this time, pt aware of surroundings, and able to follow commands, pt updated on POC, pt asked if "anyone coming to see me today", advised that staff will be making rounds shortly, pt verbalized understanding.  ? ?Pt reposition in bed, sitter remains at the bedside ?

## 2021-12-30 NOTE — ED Notes (Signed)
Admitting provider at bedside.

## 2021-12-30 NOTE — ED Notes (Signed)
ED TO INPATIENT HANDOFF REPORT ? ?ED Nurse Name and Phone #: Baxter Flattery, RN ? ?S ?Name/Age/Gender ?Jeremy Shannon ?66 y.o. ?male ?Room/Bed: 037C/037C ? ?Code Status ?  Code Status: Full Code ? ?Home/SNF/Other ?Home ?Patient oriented to: self, place, time, and situation ?Is this baseline? Yes  ? ?Triage Complete: Triage complete  ?Chief Complaint ?Intentional overdose ([place]) [T50.902A] ? ?Triage Note ?Per [facility] pt coming from home- last seen normal by stepdaughter at 8:30 pm last night and found patient in floor at 9:50 this morning. Right sided facial droop and slurred speech and AMS.  18G LAC. 100CC NS given en route.   ? ?Allergies ?No Known Allergies ? ?Level of Care/Admitting Diagnosis ?ED Disposition   ? ? ED Disposition  ?Admit  ? Condition  ?--  ? Comment  ?Hospital Area: [facility] [[id number]] ? Level of Care: Telemetry Medical [104] ? May place patient in observation at [facility] or [facility] if equivalent level of care is available:: Yes ? Covid Evaluation: Asymptomatic - no recent exposure (last 10 days) testing not required ? Diagnosis: Intentional overdose ([place]) [2440102] ? Admitting Physician: Marcelyn Bruins [[id number]] ? Attending Physician: Marcelyn Bruins [[id number]] ?  ?  ? ?  ? ? ?B ?Medical/Surgery History ?Past Medical History:  ?Diagnosis Date  ? Anemia   ? pt denies  ? Anxiety   ? Cataract   ? Depression   ? Diabetes mellitus without complication ([place])   ? type 2  ? Dyspnea   ? Hyperlipidemia   ? PONV (postoperative nausea and vomiting)   ? ?Past Surgical History:  ?Procedure Laterality Date  ? BRONCHIAL BIOPSY  02/16/2021  ? Procedure: BRONCHIAL BIOPSIES;  Surgeon: Garner Nash, DO;  Location: [facility];  Service: Pulmonary;;  ? BRONCHIAL BRUSHINGS  02/16/2021  ? Procedure: BRONCHIAL BRUSHINGS;  Surgeon: Garner Nash, DO;  Location: [facility];  Service: Pulmonary;;  ? BRONCHIAL NEEDLE ASPIRATION BIOPSY  02/16/2021  ? Procedure: BRONCHIAL NEEDLE ASPIRATION BIOPSIES;   Surgeon: Garner Nash, DO;  Location: [facility];  Service: Pulmonary;;  ? BRONCHIAL WASHINGS  02/16/2021  ? Procedure: BRONCHIAL WASHINGS;  Surgeon: Garner Nash, DO;  Location: [facility];  Service: Pulmonary;;  ? CATARACT EXTRACTION    ? bilateral  ? CHOLECYSTECTOMY N/A 02/10/2017  ? Procedure: LAPAROSCOPIC CHOLECYSTECTOMY WITH INTRAOPERATIVE CHOLANGIOGRAM;  Surgeon: Alphonsa Overall, MD;  [facility];  Service: General;  Laterality: N/A;  ? COLONOSCOPY    ? FIDUCIAL MARKER PLACEMENT  02/16/2021  ? Procedure: FIDUCIAL MARKER PLACEMENT;  Surgeon: Garner Nash, DO;  Location: [facility];  Service: Pulmonary;;  ? VIDEO BRONCHOSCOPY WITH ENDOBRONCHIAL NAVIGATION Right 02/16/2021  ? Procedure: VIDEO BRONCHOSCOPY WITH ENDOBRONCHIAL NAVIGATION;  Surgeon: Garner Nash, DO;  Location: [facility];  Service: Pulmonary;  Laterality: Right;  ?  ? ?A ?IV Location/Drains/Wounds ?Patient Lines/Drains/Airways Status   ? ? Active Line/Drains/Airways   ? ? Name Placement date Placement time Site Days  ? Peripheral IV 12/29/21 20 G 1" Posterior;Right Hand 12/29/21  1246  Hand  1  ? Peripheral IV 12/29/21 18 G Left Antecubital 12/29/21  1050  Antecubital  1  ? Incision (Closed) 02/10/17 Abdomen Other (Comment) 02/10/17  1319  -- 1784  ? Incision - 4 Ports Abdomen Right;Lateral Right;Medial Umbilicus Left;Upper 59/56/38  1319  -- 1784  ? ?  ?  ? ?  ? ? ?Intake/Output Last 24 hours ? ?Intake/Output Summary (Last 24 hours) at 12/30/2021 0912 ?Last data  filed at 12/30/2021 [phone] ?Gross per 24 hour  ?Intake 2001.62 ml  ?Output 1180 ml  ?Net 821.62 ml  ? ? ?Labs/Imaging ?Results for orders placed or performed during the hospital encounter of 12/29/21 (from the past 48 hour(s))  ?CBG monitoring, ED     Status: Abnormal  ? Collection Time: 12/29/21 11:00 AM  ?Result Value Ref Range  ? Glucose-Capillary 166 (H) 70 - 99 mg/dL  ?  Comment: Glucose reference range applies only to samples taken after fasting for at least 8 hours.   ?Comprehensive metabolic panel     Status: Abnormal  ? Collection Time: 12/29/21 11:05 AM  ?Result Value Ref Range  ? Sodium 136 135 - 145 mmol/L  ? Potassium 3.6 3.5 - 5.1 mmol/L  ? Chloride 104 98 - 111 mmol/L  ? CO2 22 22 - 32 mmol/L  ? Glucose, Bld 165 (H) 70 - 99 mg/dL  ?  Comment: Glucose reference range applies only to samples taken after fasting for at least 8 hours.  ? BUN 12 8 - 23 mg/dL  ? Creatinine, Ser 1.21 0.61 - 1.24 mg/dL  ? Calcium 8.9 8.9 - 10.3 mg/dL  ? Total Protein 6.7 6.5 - 8.1 g/dL  ? Albumin 3.8 3.5 - 5.0 g/dL  ? AST 16 15 - 41 U/L  ? ALT 18 0 - 44 U/L  ? Alkaline Phosphatase 69 38 - 126 U/L  ? Total Bilirubin 0.7 0.3 - 1.2 mg/dL  ? GFR, Estimated >60 >60 mL/min  ?  Comment: (NOTE) ?Calculated using the CKD-EPI Creatinine Equation (2021) ?  ? Anion gap 10 5 - 15  ?  Comment: Performed at [facility], [place] [address], [place], [place] [address]  ?Acetaminophen level     Status: Abnormal  ? Collection Time: 12/29/21 11:05 AM  ?Result Value Ref Range  ? Acetaminophen (Tylenol), Serum <10 (L) 10 - 30 ug/mL  ?  Comment: (NOTE) ?Therapeutic concentrations vary significantly. A range of 10-30 ug/mL  ?may be an effective concentration for many patients. However, some  ?are best treated at concentrations outside of this range. ?Acetaminophen concentrations >150 ug/mL at 4 hours after ingestion  ?and >50 ug/mL at 12 hours after ingestion are often associated with  ?toxic reactions. ? ?Performed at [facility], [place] [address] [place], [place] ?[address] ?  ?Salicylate level     Status: Abnormal  ? Collection Time: 12/29/21 11:05 AM  ?Result Value Ref Range  ? Salicylate Lvl <0.3 (L) 7.0 - 30.0 mg/dL  ?  Comment: Performed at [facility], [place] [address], [place], [place] [address]  ?Ethanol     Status: None  ? Collection Time: 12/29/21 11:05 AM  ?Result Value Ref Range  ? Alcohol, Ethyl (B) <10 <10 mg/dL  ?  Comment: (NOTE) ?Lowest detectable limit for serum alcohol is 10  mg/dL. ? ?For medical purposes only. ?Performed at [facility], [place] [address], [place], [place] ?[address] ?  ?CBC with Differential     Status: Abnormal  ? Collection Time: 12/29/21 11:05 AM  ?Result Value Ref Range  ? WBC 12.4 (H) 4.0 - 10.5 K/uL  ? RBC 4.96 4.22 - 5.81 MIL/uL  ? Hemoglobin 16.0 13.0 - 17.0 g/dL  ? HCT 47.2 39.0 - 52.0 %  ? MCV 95.2 80.0 - 100.0 fL  ? MCH 32.3 26.0 - 34.0 pg  ? MCHC 33.9 30.0 - 36.0 g/dL  ? RDW 12.8 11.5 - 15.5 %  ?  Platelets 287 150 - 400 K/uL  ? nRBC 0.0 0.0 - 0.2 %  ? Neutrophils Relative % 85 %  ? Neutro Abs 10.6 (H) 1.7 - 7.7 K/uL  ? Lymphocytes Relative 8 %  ? Lymphs Abs 1.0 0.7 - 4.0 K/uL  ? Monocytes Relative 6 %  ? Monocytes Absolute 0.7 0.1 - 1.0 K/uL  ? Eosinophils Relative 0 %  ? Eosinophils Absolute 0.0 0.0 - 0.5 K/uL  ? Basophils Relative 0 %  ? Basophils Absolute 0.0 0.0 - 0.1 K/uL  ? Immature Granulocytes 1 %  ? Abs Immature Granulocytes 0.06 0.00 - 0.07 K/uL  ?  Comment: Performed at [facility], [place] [address], [place], [place] [address]  ?Magnesium     Status: None  ? Collection Time: 12/29/21 11:05 AM  ?Result Value Ref Range  ? Magnesium 2.0 1.7 - 2.4 mg/dL  ?  Comment: Performed at [facility], [place] [address], [place], [place] [address]  ?Lactic acid, plasma     Status: Abnormal  ? Collection Time: 12/29/21 12:18 PM  ?Result Value Ref Range  ? Lactic Acid, Venous 2.0 (HH) 0.5 - 1.9 mmol/L  ?  Comment: CRITICAL RESULT CALLED TO, READ BACK BY AND VERIFIED WITH: ?MONTEE,B RN @ 1062 12/29/21 LEONARD,A ?Performed at [facility], [place] [address], [place], [place] [address] ?  ?Ammonia     Status: Abnormal  ? Collection Time: 12/29/21 12:18 PM  ?Result Value Ref Range  ? Ammonia 40 (H) 9 - 35 umol/L  ?  Comment: Performed at [facility], [place] [address] [place], [place] [address]  ?I-Stat venous blood gas, ED     Status: Abnormal  ? Collection Time: 12/29/21 12:39 PM  ?Result Value Ref Range  ? pH, Ven 7.387 7.25 - 7.43  ? pCO2, Ven  35.3 (L) 44 - 60 mmHg  ? pO2, Ven 94 (H) 32 - 45 mmHg  ? Bicarbonate 21.2 20.0 - 28.0 mmol/L  ? TCO2 22 22 - 32 mmol/L  ? O2 Saturation 97 %  ? Acid-base deficit 3.0 (H) 0.0 - 2.0 mmol/L  ? Sodium 136 135 - 1

## 2021-12-30 NOTE — ED Notes (Signed)
Pt continues to appears to be resting with eyes closed, even & unlabored RR, skin warm and dry, safety in place, sitter at the bedside for safety. POC on-going, will continue to monitor. ?

## 2021-12-30 NOTE — ED Notes (Signed)
Pt continues to appears to be resting with eyes closed, even & unlabored RR, skin warm and dry, safety in place, sitter at the bedside for safety. POC on-going, will continue to monitor.  ?

## 2021-12-30 NOTE — ED Notes (Signed)
Lab tech at the bedside.

## 2021-12-30 NOTE — Progress Notes (Signed)
CSW updated by Amie Critchley, RN that pt needs a COVID-19 and fly test. [place], RN also reports that pt needs a medical clearance note. CSW added pt's current nursing care team to communication: Servando [place]ina, RN and Geoffery Spruce, [place]. Pt has been approved to admit to [facility] per Caren Griffins, DO.  ? ? ? ?Benjaman Kindler, MSW, LCSWA ?12/30/2021 8:23 PM ? ? ?

## 2021-12-30 NOTE — ED Notes (Signed)
Wife Jeremy Shannon [phone] wants an update asap ?

## 2021-12-30 NOTE — ED Notes (Signed)
Pt assisted with a urinal, sitter at bedside ?

## 2021-12-30 NOTE — Consult Note (Signed)
[facility] Face-to-Face Psychiatry Consult  ? ?Reason for Consult:  Intentional drug overdose ?Referring Physician:  Dr. Trilby Drummer ?Patient Identification: Jeremy Shannon ?MRN:  [id number] ?Principal Diagnosis: Intentional overdose ([place]) ?Diagnosis:  Principal Problem: ?  Intentional overdose ([place]) ?Active Problems: ?  Depression ?  Type II diabetes mellitus with complication (HCC) ?  Hyperlipidemia with target LDL less than 100 ?  Suicide attempt [facility]) ?  Acute urinary retention ? ? ?Total Time spent with patient: 45 minutes ? ?Subjective:   ?Jeremy Shannon is a 66 y.o. male patient admitted with intentional overdose on Seroquel.  Patient reports feeling down and not like himself for the past week.  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  I can feel it coming on.  So I tried to do certain things to make me feel better and knock it off."  Patient also reports increased stressors and triggers to include family dysfunction, financial stressors, and mood instability.  He does state he was feeling self-conscious prior to the event happening, and therefore he wrote a suicide note.  Patient describes his clinical presentation as despondent, anhedonia, hopeless, worthless, and suicidal 1 week prior to this attempt."  I could not see the future for myself.  I was really desponded.  I began to be depersonalize."  After discussing his attempt, patient does appear to be forthcoming and provides additional information regarding his previous psychiatric history.  He does admit this was his first attempt, and shows much remorse and regret for his decision.  He is very apologetic for hurting his family, and not coming to them sooner.  He also apologizes to himself for his actions," I still cannot believe I did this to myself."  Patient does admit he did not notify anyone of his attempt, and only remembers waking up in the hospital. ? ?Patient states he has a previous diagnosis of depression, depersonalization, anxiety, and  parkinsonian like symptoms.  He is currently receiving outpatient psychiatric services at [facility].  His last appointment was 2 weeks ago with Dr. Jobie Quaker, in which he states" at that time I was feeling really good then.  I did not tell her about my depression and suicidal thoughts as they were not there."  He denies any history of inpatient psychiatric admission.  He denies any recent history of substance abuse to include alcohol, marijuana, and or any other illicit substances.  He denies any history of suicide attempt, suicidal thoughts, and or nonsuicidal self-injurious behavior.  He does admit to purchasing marijuana 1 time last year, however feels it was tainted " I have not been right since."  ? ?On evaluation patient is alert and oriented, calm and cooperative, very pleasant upon approach.  He is observed to be lying in bed, and arouses easily and initiates participation with this psychiatric nurse practitioner.  Patient does admit to suicide attempt by overdose on approximately 10 quetiapine's, followed by suicide letter.  He is currently receiving outpatient services and medication management through [facility], under the care of Dr. Jobie Quaker.  He states prior to his attempt he had been feeling very despondent, withdrawn, hopeless, and worthless in addition to suicidal.  He did make multiple attempts to "shake his self out of it ", however was unsuccessful.  He denies any changes in sleep and or appetite prior to this event.  He denies any acute psychiatric symptoms at this time, with the exception of remorse and regret for his suicide attempt.  Patient  denies any auditory and/or visual hallucinations, does not appear to be responding to internal or external stimuli.  There is no evidence of delusional thought content and patient appears to answer all questions appropriately.  Patient is advised we will be recommending inpatient psychiatric admission, and  which he reports he would like to leave. "  I am not committed, and I would like to go home.  I am kicking myself for what I did.  I should not have done it."  Discussed with patient in depth regarding his recent involuntary commitment, that has been completed during his hospitalization as he was a danger to himself and or others. "  I was not a danger to others, more so myself.  I do not feel that way now and would like to go home."  Again emphasis was placed on current recommendations for inpatient psychiatric hospitalization once medically stable.  Discussed length in terms of care to include 5 to 7-day stay and inpatient psychiatric hospitalization. ? ?HPI:  Jeremy Shannon is a 66 y.o. male with medical history significant of diabetes, hyperlipidemia, neuropathy, chronic bronchitis, hypertension, parkinsonian features, anxiety, depression presenting with altered mental status with concern for overdose. ? ?History obtained with assistance of family and chart review due to patient's altered mentation.  Patient was found down at home around 10 AM by his daughter.  Was found in the living room.  Daughter lives with him and her mother as well.  Mother/patient's wife is currently out of town.  Patient has known history of prior suicide attempt and did leave a suicide note.  Daughter states that note has multiple injuries over a period of time which each entry appearing more scattered and less legible than the prior. ? ?Suspected to be a overdose of Seroquel.  Patient became slightly more alert in the ED but not yet oriented and unable to participate in exam by psychiatry and unable to be medically cleared.  Per family the only known medication that he currently has access to his Seroquel, chart review shows he is also prescribed medications for hyperlipidemia and diabetes, but glucose remains normal and other labs are stable as below. ? ?Past Psychiatric History: See above ? ?Risk to Self: Yes recent suicide  attempt by overdose ?Risk to Others: Denies ?Prior Inpatient Therapy: Denies ?Prior Outpatient Therapy: [facility] ? ?Past Medical History:  ?Past Medical History:  ?Diagnosis Date  ? Anemia   ? pt denies  ? Anxiety   ? Cataract   ? Depression   ? Diabetes mellitus without complication ([place])   ? type 2  ? Dyspnea   ? Hyperlipidemia   ? PONV (postoperative nausea and vomiting)   ?  ?Past Surgical History:  ?Procedure Laterality Date  ? BRONCHIAL BIOPSY  02/16/2021  ? Procedure: BRONCHIAL BIOPSIES;  Surgeon: Garner Nash, DO;  Location: [facility];  Service: Pulmonary;;  ? BRONCHIAL BRUSHINGS  02/16/2021  ? Procedure: BRONCHIAL BRUSHINGS;  Surgeon: Garner Nash, DO;  Location: [place];  Service: Pulmonary;;  ? BRONCHIAL NEEDLE ASPIRATION BIOPSY  02/16/2021  ? Procedure: BRONCHIAL NEEDLE ASPIRATION BIOPSIES;  Surgeon: Garner Nash, DO;  Location: [place];  Service: Pulmonary;;  ? BRONCHIAL WASHINGS  02/16/2021  ? Procedure: BRONCHIAL WASHINGS;  Surgeon: Garner Nash, DO;  Location: [facility];  Service: Pulmonary;;  ? CATARACT EXTRACTION    ? bilateral  ? CHOLECYSTECTOMY N/A 02/10/2017  ? Procedure: LAPAROSCOPIC CHOLECYSTECTOMY WITH INTRAOPERATIVE CHOLANGIOGRAM;  Surgeon: Alphonsa Overall, MD;  Location: [facility];  Service: General;  Laterality: N/A;  ? COLONOSCOPY    ? FIDUCIAL MARKER PLACEMENT  02/16/2021  ? Procedure: FIDUCIAL MARKER PLACEMENT;  Surgeon: Garner Nash, DO;  Location: [facility];  Service: Pulmonary;;  ? VIDEO BRONCHOSCOPY WITH ENDOBRONCHIAL NAVIGATION Right 02/16/2021  ? Procedure: VIDEO BRONCHOSCOPY WITH ENDOBRONCHIAL NAVIGATION;  Surgeon: Garner Nash, DO;  Location: [place];  Service: Pulmonary;  Laterality: Right;  ? ?Family History:  ?Family History  ?Problem Relation Age of Onset  ? Liver cancer Mother   ? Depression Father   ? Drug abuse Father   ? ?Family Psychiatric  History: He denies ?Social History:  ?Social History  ? ?Substance and Sexual  Activity  ?Alcohol Use No  ?   ?Social History  ? ?Substance and Sexual Activity  ?Drug Use No  ?  ?Social History  ? ?Socioeconomic History  ? Marital status: Married  ?  Spouse name: Not on file  ? Number of chil

## 2021-12-30 NOTE — Social Work (Signed)
Per MD, pt is medically ready for inpatient psych. CSW sent referral to [facility] and is waiting a response.  ? ?Emeterio Reeve, LCSW ?Clinical Social Worker ? ?

## 2021-12-30 NOTE — ED Notes (Signed)
Pt denies SI or suicide attempt at this time. ?

## 2021-12-30 NOTE — Progress Notes (Signed)
?PROGRESS NOTE ? ? ? ?Jeremy Shannon  YNW:[id number] DOB: March 25, 1956 DOA: 12/29/2021 ?PCP: [name], MD  ?66/M with history of depression, type 2 diabetes mellitus, hypertension, dyslipidemia, neuropathy, chronic bronchitis, parkinsonian features, anxiety was brought to the ED by his daughter after he was found down altered at 10 AM on 4/19 by his daughter with a suicide note.  He was suspected to have overdosed on Seroquel ?-Progressively became more alert in the ED ?-Vitals were stable Labs noted mild leukocytosis, ammonia of 40.  Poison control was called, recommendations were to monitor QTc which was stable yesterday ? ? ?Subjective: ?-Feels okay today, reports having suicidal thoughts and ideations yesterday, took about 5 or 6 tabs of Seroquel and then decided not to overdose ? ?Assessment and Plan: ? ?Suicide attempt, intentional overdose ?Major depression ?-Patient reports today he may have consumed 5-6 tabs of Seroquel,  ?-Awake and alert this morning, will request psych consult ?-IVC forms filled out by EDP last night ?-Repeat EKG today for QTc ?-Continue one-to-one sitter ? ?Urinary retention ?-In the setting of above, required I's/O catheterization ?-Started on Flomax yesterday, monitor ? ?Mild leukocytosis ?-Reactive, resolved ? ?Type 2 diabetes mellitus ?-Resume Jardiance ? ?DVT prophylaxis: Lovenox ?Code Status: Full code ?Family Communication: Discussed with patient in detail, no family at bedside ?Disposition Plan: Per psych ? ?Consultants:  ?Psych pending ? ?Procedures:  ? ?Antimicrobials:  ? ? ?Objective: ?Vitals:  ? 12/30/21 0400 12/30/21 0500 12/30/21 0600 12/30/21 0730  ?BP: 130/78 (!) 128/94 (!) 143/92 (!) 146/106  ?Pulse: 86 87  86  ?Resp: '18 18 20 '$ (!) 21  ?Temp:   98.7 ?F (37.1 ?C)   ?TempSrc:   Oral   ?SpO2: 97% 98% 98% 98%  ?Weight:      ?Height:      ? ? ?Intake/Output Summary (Last 24 hours) at 12/30/2021 1002 ?Last data filed at 12/30/2021 [id number] ?Gross per 24 hour  ?Intake 2001.62  ml  ?Output 1180 ml  ?Net 821.62 ml  ? ?Filed Weights  ? 12/29/21 1053  ?Weight: 94.8 kg  ? ? ?Examination: ? ?General exam: Pleasant male sitting up in bed, AAOx3, no distress ?HEENT: No JVD ?CVS: S1-S2, regular rate rhythm ?Lungs: Clear bilaterally ?Abdomen: Soft, nontender, bowel sounds present ?Extremities: No edema ?Neuro: Moves all extremities, no localizing signs ?Psych: Flat affect ? ?Data Reviewed:  ? ?CBC: ?Recent Labs  ?Lab 12/29/21 ?1105 12/29/21 ?1239 12/30/21 ?0438  ?WBC 12.4*  --  10.0  ?NEUTROABS 10.6*  --   --   ?HGB 16.0 16.0 15.8  ?HCT 47.2 47.0 45.4  ?MCV 95.2  --  92.5  ?PLT 287  --  303  ? ?Basic Metabolic Panel: ?Recent Labs  ?Lab 12/29/21 ?1105 12/29/21 ?1239 12/30/21 ?0438  ?NA 136 136 137  ?K 3.6 3.6 3.6  ?CL 104  --  109  ?CO2 22  --  20*  ?GLUCOSE 165*  --  97  ?BUN 12  --  8  ?CREATININE 1.21  --  1.03  ?CALCIUM 8.9  --  8.2*  ?MG 2.0  --   --   ? ?GFR: ?Estimated Creatinine Clearance: 76.7 mL/min (by C-G formula based on SCr of 1.03 mg/dL). ?Liver Function Tests: ?Recent Labs  ?Lab 12/29/21 ?1105 12/30/21 ?0438  ?AST 16 17  ?ALT 18 16  ?ALKPHOS 69 64  ?BILITOT 0.7 0.7  ?PROT 6.7 6.0*  ?ALBUMIN 3.8 3.6  ? ?No results for input(s): LIPASE, AMYLASE in the last 168 hours. ?Recent Labs  ?  Lab 12/29/21 ?1218  ?AMMONIA 40*  ? ?Coagulation Profile: ?No results for input(s): INR, PROTIME in the last 168 hours. ?Cardiac Enzymes: ?No results for input(s): CKTOTAL, CKMB, CKMBINDEX, TROPONINI in the last 168 hours. ?BNP (last 3 results) ?No results for input(s): PROBNP in the last 8760 hours. ?HbA1C: ?No results for input(s): HGBA1C in the last 72 hours. ?CBG: ?Recent Labs  ?Lab 12/29/21 ?1100 12/30/21 ?8453 12/30/21 ?6468  ?GLUCAP 166* 89 94  ? ?Lipid Profile: ?No results for input(s): CHOL, HDL, LDLCALC, TRIG, CHOLHDL, LDLDIRECT in the last 72 hours. ?Thyroid Function Tests: ?No results for input(s): TSH, T4TOTAL, FREET4, T3FREE, THYROIDAB in the last 72 hours. ?Anemia Panel: ?No results for  input(s): VITAMINB12, FOLATE, FERRITIN, TIBC, IRON, RETICCTPCT in the last 72 hours. ?Urine analysis: ?   ?Component Value Date/Time  ? [place] YELLOW 12/29/2021 1750  ? APPEARANCEUR CLEAR 12/29/2021 1750  ? LABSPEC 1.020 12/29/2021 1750  ? PHURINE 5.0 12/29/2021 1750  ? GLUCOSEU 150 (A) 12/29/2021 1750  ? GLUCOSEU >=1000 (A) 11/11/2021 1552  ? [place] NEGATIVE 12/29/2021 1750  ? [place] NEGATIVE 12/29/2021 1750  ? KETONESUR 20 (A) 12/29/2021 1750  ? PROTEINUR 30 (A) 12/29/2021 1750  ? UROBILINOGEN 0.2 11/11/2021 1552  ? NITRITE NEGATIVE 12/29/2021 1750  ? LEUKOCYTESUR NEGATIVE 12/29/2021 1750  ? ?Sepsis Labs: ?'@LABRCNTIP'$ (procalcitonin:4,lacticidven:4) ? ?)No results found for this or any previous visit (from the past 240 hour(s)).  ? ?Radiology Studies: ?CT Head Wo Contrast ? ?Result Date: 12/29/2021 ?CLINICAL DATA:  Found down. EXAM: CT HEAD WITHOUT CONTRAST CT CERVICAL SPINE WITHOUT CONTRAST TECHNIQUE: Multidetector CT imaging of the head and cervical spine was performed following the standard protocol without intravenous contrast. Multiplanar CT image reconstructions of the cervical spine were also generated. RADIATION DOSE REDUCTION: This exam was performed according to the departmental dose-optimization program which includes automated exposure control, adjustment of the mA and/or kV according to patient size and/or use of iterative reconstruction technique. COMPARISON:  Head CT 03/26/2021 and prior cervical spine MRI 03/14/2021 FINDINGS: CT HEAD FINDINGS Brain: Stable severe age advanced cerebral atrophy. The ventricles are in the midline without mass effect or shift. They are normal in size and configuration. No CT findings to suggest an acute hemispheric infarction or intracranial hemorrhage. No mass lesions. The brainstem and cerebellum are grossly normal. Vascular: Stable mild vascular calcifications but no aneurysm or hyperdense vessels. Skull: No acute skull fracture or bone lesions. Sinuses/Orbits:  The paranasal sinuses and mastoid air cells are grossly clear. The globes are intact. Other: No scalp lesions or scalp hematoma. CT CERVICAL SPINE FINDINGS Alignment: Normal Skull base and vertebrae: No acute fracture. No primary bone lesion or focal pathologic process. Soft tissues and spinal canal: No prevertebral fluid or swelling. No visible canal hematoma. Disc levels: Stable degenerative cervical spondylosis with multilevel disc disease and facet disease. The spinal canal is fairly generous in is no significant spinal stenosis. Mild multilevel foraminal stenosis is noted. There is a large left-sided bone spur at C5-6 with mass effect on the left side of the thecal sac and likely effecting the left C6 nerve root. This appears stable when compared to the prior MR examination. Upper chest: The visualized lung apices are grossly clear. Other: No neck mass or adenopathy. IMPRESSION: 1. Stable severe age advanced cerebral atrophy but no acute intracranial findings. 2. Stable degenerative cervical spondylosis with multilevel disc disease and facet disease. 3. No acute cervical spine fracture or subluxation. Electronically Signed   By: Marijo Sanes M.D.   On:  12/29/2021 14:33  ? ?CT CERVICAL SPINE WO CONTRAST ? ?Result Date: 12/29/2021 ?CLINICAL DATA:  Found down. EXAM: CT HEAD WITHOUT CONTRAST CT CERVICAL SPINE WITHOUT CONTRAST TECHNIQUE: Multidetector CT imaging of the head and cervical spine was performed following the standard protocol without intravenous contrast. Multiplanar CT image reconstructions of the cervical spine were also generated. RADIATION DOSE REDUCTION: This exam was performed according to the departmental dose-optimization program which includes automated exposure control, adjustment of the mA and/or kV according to patient size and/or use of iterative reconstruction technique. COMPARISON:  Head CT 03/26/2021 and prior cervical spine MRI 03/14/2021 FINDINGS: CT HEAD FINDINGS Brain: Stable severe  age advanced cerebral atrophy. The ventricles are in the midline without mass effect or shift. They are normal in size and configuration. No CT findings to suggest an acute hemispheric infarction or intracra

## 2021-12-30 NOTE — Telephone Encounter (Signed)
Pt wife called in to cx appt to due him being at the [facility]. She states that she would like Dr. Ronnald Ramp to look at the chart to see what going on with the pt during this HOS visit. She states its something going on and he is more than just sick. ? ?FYI ?

## 2021-12-31 ENCOUNTER — Encounter: Payer: Self-pay | Admitting: Psychiatry

## 2021-12-31 ENCOUNTER — Other Ambulatory Visit: Payer: Self-pay

## 2021-12-31 ENCOUNTER — Inpatient Hospital Stay
Admission: AD | Admit: 2021-12-31 | Discharge: 2022-01-12 | DRG: 885 | Disposition: A | Discharge status: Home / Self Care | Payer: Medicare Other | Source: Intra-hospital | Attending: Psychiatry | Admitting: Psychiatry

## 2021-12-31 DIAGNOSIS — D72829 Elevated white blood cell count, unspecified: Secondary | ICD-10-CM | POA: Diagnosis not present

## 2021-12-31 DIAGNOSIS — Z818 Family history of other mental and behavioral disorders: Secondary | ICD-10-CM | POA: Diagnosis not present

## 2021-12-31 DIAGNOSIS — E119 Type 2 diabetes mellitus without complications: Secondary | ICD-10-CM | POA: Diagnosis present

## 2021-12-31 DIAGNOSIS — T43592A Poisoning by other antipsychotics and neuroleptics, intentional self-harm, initial encounter: Secondary | ICD-10-CM | POA: Diagnosis present

## 2021-12-31 DIAGNOSIS — F17210 Nicotine dependence, cigarettes, uncomplicated: Secondary | ICD-10-CM | POA: Diagnosis not present

## 2021-12-31 DIAGNOSIS — G4700 Insomnia, unspecified: Secondary | ICD-10-CM | POA: Diagnosis present

## 2021-12-31 DIAGNOSIS — Z7984 Long term (current) use of oral hypoglycemic drugs: Secondary | ICD-10-CM

## 2021-12-31 DIAGNOSIS — R339 Retention of urine, unspecified: Secondary | ICD-10-CM | POA: Diagnosis not present

## 2021-12-31 DIAGNOSIS — I10 Essential (primary) hypertension: Secondary | ICD-10-CM | POA: Diagnosis not present

## 2021-12-31 DIAGNOSIS — F411 Generalized anxiety disorder: Secondary | ICD-10-CM | POA: Diagnosis present

## 2021-12-31 DIAGNOSIS — Z79899 Other long term (current) drug therapy: Secondary | ICD-10-CM

## 2021-12-31 DIAGNOSIS — F1311 Sedative, hypnotic or anxiolytic abuse, in remission: Secondary | ICD-10-CM | POA: Diagnosis present

## 2021-12-31 DIAGNOSIS — J449 Chronic obstructive pulmonary disease, unspecified: Secondary | ICD-10-CM | POA: Diagnosis present

## 2021-12-31 DIAGNOSIS — R338 Other retention of urine: Secondary | ICD-10-CM | POA: Diagnosis not present

## 2021-12-31 DIAGNOSIS — F332 Major depressive disorder, recurrent severe without psychotic features: Secondary | ICD-10-CM | POA: Diagnosis present

## 2021-12-31 DIAGNOSIS — Z20822 Contact with and (suspected) exposure to covid-19: Secondary | ICD-10-CM | POA: Diagnosis not present

## 2021-12-31 DIAGNOSIS — E785 Hyperlipidemia, unspecified: Secondary | ICD-10-CM | POA: Diagnosis not present

## 2021-12-31 LAB — GLUCOSE, CAPILLARY
Glucose-Capillary: 123 mg/dL — ABNORMAL HIGH (ref 70–99)
Glucose-Capillary: 107 mg/dL — ABNORMAL HIGH (ref 70–99)
Glucose-Capillary: 117 mg/dL — ABNORMAL HIGH (ref 70–99)
Glucose-Capillary: 110 mg/dL — ABNORMAL HIGH (ref 70–99)

## 2021-12-31 LAB — HEMOGLOBIN A1C
Hgb A1c MFr Bld: 5.8 % — ABNORMAL HIGH (ref 4.8–5.6)
Mean Plasma Glucose: 119.76 mg/dL

## 2021-12-31 MED ORDER — TAMSULOSIN HCL 0.4 MG PO CAPS
0.4000 mg | ORAL_CAPSULE | Freq: Every day | ORAL | Status: DC
  Administered 2022-01-01 – 2022-01-12 (×12): 0.4 mg via ORAL
  Filled 2021-12-31 (×12): qty 1

## 2021-12-31 MED ORDER — ICOSAPENT ETHYL 1 G PO CAPS
1.0000 g | ORAL_CAPSULE | Freq: Two times a day (BID) | ORAL | Status: DC
  Administered 2021-12-31 – 2022-01-12 (×24): 1 g via ORAL
  Filled 2021-12-31 (×24): qty 1

## 2021-12-31 MED ORDER — ACETAMINOPHEN 325 MG PO TABS: 650.0000 mg | ORAL_TABLET | Freq: Four times a day (QID) | ORAL | Status: DC | PRN

## 2021-12-31 MED ORDER — TAMSULOSIN HCL 0.4 MG PO CAPS: 0.4000 mg | ORAL_CAPSULE | Freq: Every day | ORAL | 0 refills | Status: DC

## 2021-12-31 MED ORDER — EMPAGLIFLOZIN 10 MG PO TABS
10.0000 mg | ORAL_TABLET | Freq: Every day | ORAL | Status: DC
  Administered 2022-01-01 – 2022-01-12 (×12): 10 mg via ORAL
  Filled 2021-12-31 (×12): qty 1

## 2021-12-31 MED ORDER — HYDROXYZINE HCL 25 MG PO TABS
50.0000 mg | ORAL_TABLET | Freq: Four times a day (QID) | ORAL | Status: DC | PRN
  Administered 2021-12-31 – 2022-01-05 (×8): 50 mg via ORAL
  Filled 2021-12-31 (×8): qty 2

## 2021-12-31 MED ORDER — SIMVASTATIN 20 MG PO TABS
20.0000 mg | ORAL_TABLET | Freq: Every day | ORAL | Status: DC
  Administered 2021-12-31 – 2022-01-11 (×12): 20 mg via ORAL
  Filled 2021-12-31 (×12): qty 1

## 2021-12-31 MED ORDER — TRAZODONE HCL 50 MG PO TABS
50.0000 mg | ORAL_TABLET | Freq: Every evening | ORAL | Status: DC | PRN
  Administered 2021-12-31: 50 mg via ORAL
  Filled 2021-12-31: qty 1

## 2021-12-31 MED ORDER — MAGNESIUM HYDROXIDE 400 MG/5ML PO SUSP: 30.0000 mL | Freq: Every day | ORAL | Status: DC | PRN

## 2021-12-31 MED ORDER — INSULIN ASPART 100 UNIT/ML IJ SOLN
0.0000 [IU] | Freq: Three times a day (TID) | INTRAMUSCULAR | Status: DC
  Administered 2022-01-01 – 2022-01-05 (×3): 2 [IU] via SUBCUTANEOUS
  Administered 2022-01-06: 5 [IU] via SUBCUTANEOUS
  Administered 2022-01-07: 2 [IU] via SUBCUTANEOUS
  Administered 2022-01-11: 3 [IU] via SUBCUTANEOUS
  Filled 2021-12-31 (×7): qty 1

## 2021-12-31 MED ORDER — ALUM & MAG HYDROXIDE-SIMETH 200-200-20 MG/5ML PO SUSP: 30.0000 mL | ORAL | Status: DC | PRN

## 2021-12-31 NOTE — Progress Notes (Signed)
Call and gave report to [place] at [facility] general psychiatric. ? ? Jeremy Shannon to be D/C'd  per MD order.  Discussed with the patient and all questions fully answered. ? ?VSS, Skin clean, dry and intact without evidence of skin break down, no evidence of skin tears noted. ? ?IV catheter discontinued intact. Site without signs and symptoms of complications. Dressing and pressure applied. ? ?Patient instructed to return to ED, call 911, or call MD for any changes in condition.  ? ?Patient to be escorted via [place], and D/C to [facility] via private auto. ?

## 2021-12-31 NOTE — Tx Team (Signed)
Initial Treatment Plan ?12/31/2021 ?5:09 PM ?Jeremy Shannon ?WNI:[id number] ? ? ? ?PATIENT STRESSORS: ?Financial difficulties   ? ? ?PATIENT STRENGTHS: ?Supportive family/friends  ? ? ?PATIENT IDENTIFIED PROBLEMS: ?Patient unable to specified but stated " I haven't been   ?Myself"  ?  ?  ?  ?  ?  ?  ?  ?  ? ?DISCHARGE CRITERIA:  ?Improved stabilization in mood, thinking, and/or behavior ?Reduction of life-threatening or endangering symptoms to within safe limits ? ?PRELIMINARY DISCHARGE PLAN: ?Return to previous living arrangement ? ?PATIENT/FAMILY INVOLVEMENT: ?This treatment plan has been presented to and reviewed with the patient, Jeremy Shannon. The patient have been given the opportunity to ask questions and make suggestions. ? ?Dawt Reeb, RN ?12/31/2021, 5:09 PM ?

## 2021-12-31 NOTE — Progress Notes (Signed)
Patient admitted Voluntary to [facility] from [facility] ED with admitting diagnosis of Intentional overdose, depression, type 2 DM with complications, Hyperlipidemia and urinary retention. Patient presents to unit  ambulatory. A&Ox4.  Patient states, "I don't know what I was thinking, but I won't do it again." Patient's affect is extremely anxious and worried. Patient endorses anxiety denies depression stating his main stressor to be financial. Patient currently denies suicidal ideations, homicidal ideations, audio or visual hallucinations and verbally contracts for safety on unit. Patient endorsed smoking but denies drug abuse or ETOH use. Patient reports living with. Support system, children, wife and friends. When asked what his strengths are or the goals he would like to work on while here pt states, "Clear head, be myself, love my family and never become the way I was when I took those pills." ?Body assessment done with 2nd RN. Skin dry and intact. ? ?Emotional support and reassurance provided throughout admission intake. Afterwards, Patient oriented to the unit, room and call light, reviewed POC with all questions answered and understanding verbalized. Denies any needs at this time. Patient is currently sitting in the day room watching TV. Will continue to monitor with ongoing Q 15 minute safety checks per unit protocol. ?

## 2021-12-31 NOTE — Social Work (Addendum)
Pt will Discharge to [facility] BMU. Pt will go by safe transport.  ? ?Nurse to call report to [phone].  ? ?Emeterio Reeve, LCSW ?Clinical Social Worker ? ?

## 2021-12-31 NOTE — Discharge Summary (Signed)
Physician Discharge Summary  ?Jeremy Shannon XBJ:[id number] DOB: 31-Jul-1956 DOA: 12/29/2021 ? ?PCP: Jeremy Lima, MD ? ?Admit date: 12/29/2021 ?Discharge date: 12/31/2021 ? ?Time spent: 35 minutes ? ?Recommendations for Outpatient Follow-up:  ?Psychiatric hospitalization ?PCP in 1 to 2 weeks ?Urology follow-up in 1 to 2 months ? ? ?Discharge Diagnoses:  ?Principal Problem: ?  Intentional overdose ([place]) ?Active Problems: ?  Depression ?  Type II diabetes mellitus with complication (HCC) ?  Hyperlipidemia with target LDL less than 100 ?  Suicide attempt [facility]) ?  Acute urinary retention ? ? ?Discharge Condition: Stable ? ?Diet recommendation: Diabetic ? ?Filed Weights  ? 12/29/21 1053  ?Weight: 94.8 kg  ? ? ?History of present illness:  ?66/M with history of depression, type 2 diabetes mellitus, hypertension, dyslipidemia, neuropathy, chronic bronchitis, parkinsonian features, anxiety was brought to the ED by his daughter after he was found down altered at 10 AM on 4/19 by his daughter with a suicide note.  He was suspected to have overdosed on Seroquel ?-Progressively became more alert in the ED ?-Vitals were stable Labs noted mild leukocytosis, ammonia of 40.  Poison control was called, recommendations were to monitor QTc which was stable ? ?Hospital Course:  ? ?Suicide attempt, intentional overdose ?Major depression ?-Patient reported yesterday that he may have consumed 5-6 tabs of Seroquel,  ?-IVC forms filled out by EDP 4/19 ?-Repeat EKG noted normal QTc ?-Seen by psychiatry yesterday, recommended inpatient psych ?-He is medically stable for discharge to inpatient psych when bed available ?  ?Urinary retention ?-In the setting of above, required I's/O catheterization the night of admission, since resolved ?-Started on Flomax, continue this at discharge, follow-up with urology ?  ?Mild leukocytosis ?-Reactive, resolved ?  ?Type 2 diabetes mellitus ?-Resumed Jardiance ? ?Consultants: Psychiatry ? ?Discharge  Exam: ?Vitals:  ? 12/31/21 0518 12/31/21 0836  ?BP: 120/86 123/88  ?Pulse: 97 (!) 109  ?Resp: 18 18  ?Temp: 98.7 ?F (37.1 ?C) 98.4 ?F (36.9 ?C)  ?SpO2: 93% 97%  ? ? ?General exam: Pleasant male sitting up in bed, AAOx3, no distress ?HEENT: No JVD ?CVS: S1-S2, regular rate rhythm ?Lungs: Clear bilaterally ?Abdomen: Soft, nontender, bowel sounds present ?Extremities: No edema ?Neuro: Moves all extremities, no localizing signs ?Psych: Flat affect ?  ? ?Discharge Instructions ? ? ?Discharge Instructions   ? ? Diet Carb Modified   Complete by: As directed ?  ? Increase activity slowly   Complete by: As directed ?  ? ?  ? ?Allergies as of 12/31/2021   ?No Known Allergies ?  ? ?  ?Medication List  ?  ? ?STOP taking these medications   ? ?QUEtiapine 200 MG tablet ?Commonly known as: SEROQUEL ?  ?QUEtiapine 300 MG tablet ?Commonly known as: SEROQUEL ?  ?Synjardy 5-500 MG Tabs ?Generic drug: Empagliflozin-metFORMIN HCl ?  ? ?  ? ?TAKE these medications   ? ?empagliflozin 10 MG Tabs tablet ?Commonly known as: JARDIANCE ?Take 10 mg by mouth daily. ?  ?icosapent Ethyl 1 g capsule ?Commonly known as: Vascepa ?Take 2 capsules (2 g total) by mouth 2 (two) times daily. ?What changed: how much to take ?  ?simvastatin 20 MG tablet ?Commonly known as: ZOCOR ?Take 1 tablet (20 mg total) by mouth at bedtime. ?  ?tamsulosin 0.4 MG Caps capsule ?Commonly known as: FLOMAX ?Take 1 capsule (0.4 mg total) by mouth daily. ?  ? ?  ? ?No Known Allergies ? ? ? ?The results of significant diagnostics from this hospitalization (including  imaging, microbiology, ancillary and laboratory) are listed below for reference.   ? ?Significant Diagnostic Studies: ?CT Head Wo Contrast ? ?Result Date: 12/29/2021 ?CLINICAL DATA:  Found down. EXAM: CT HEAD WITHOUT CONTRAST CT CERVICAL SPINE WITHOUT CONTRAST TECHNIQUE: Multidetector CT imaging of the head and cervical spine was performed following the standard protocol without intravenous contrast. Multiplanar CT  image reconstructions of the cervical spine were also generated. RADIATION DOSE REDUCTION: This exam was performed according to the departmental dose-optimization program which includes automated exposure control, adjustment of the mA and/or kV according to patient size and/or use of iterative reconstruction technique. COMPARISON:  Head CT 03/26/2021 and prior cervical spine MRI 03/14/2021 FINDINGS: CT HEAD FINDINGS Brain: Stable severe age advanced cerebral atrophy. The ventricles are in the midline without mass effect or shift. They are normal in size and configuration. No CT findings to suggest an acute hemispheric infarction or intracranial hemorrhage. No mass lesions. The brainstem and cerebellum are grossly normal. Vascular: Stable mild vascular calcifications but no aneurysm or hyperdense vessels. Skull: No acute skull fracture or bone lesions. Sinuses/Orbits: The paranasal sinuses and mastoid air cells are grossly clear. The globes are intact. Other: No scalp lesions or scalp hematoma. CT CERVICAL SPINE FINDINGS Alignment: Normal Skull base and vertebrae: No acute fracture. No primary bone lesion or focal pathologic process. Soft tissues and spinal canal: No prevertebral fluid or swelling. No visible canal hematoma. Disc levels: Stable degenerative cervical spondylosis with multilevel disc disease and facet disease. The spinal canal is fairly generous in is no significant spinal stenosis. Mild multilevel foraminal stenosis is noted. There is a large left-sided bone spur at C5-6 with mass effect on the left side of the thecal sac and likely effecting the left C6 nerve root. This appears stable when compared to the prior MR examination. Upper chest: The visualized lung apices are grossly clear. Other: No neck mass or adenopathy. IMPRESSION: 1. Stable severe age advanced cerebral atrophy but no acute intracranial findings. 2. Stable degenerative cervical spondylosis with multilevel disc disease and facet  disease. 3. No acute cervical spine fracture or subluxation. Electronically Signed   By: Marijo Sanes M.D.   On: 12/29/2021 14:33  ? ?CT CERVICAL SPINE WO CONTRAST ? ?Result Date: 12/29/2021 ?CLINICAL DATA:  Found down. EXAM: CT HEAD WITHOUT CONTRAST CT CERVICAL SPINE WITHOUT CONTRAST TECHNIQUE: Multidetector CT imaging of the head and cervical spine was performed following the standard protocol without intravenous contrast. Multiplanar CT image reconstructions of the cervical spine were also generated. RADIATION DOSE REDUCTION: This exam was performed according to the departmental dose-optimization program which includes automated exposure control, adjustment of the mA and/or kV according to patient size and/or use of iterative reconstruction technique. COMPARISON:  Head CT 03/26/2021 and prior cervical spine MRI 03/14/2021 FINDINGS: CT HEAD FINDINGS Brain: Stable severe age advanced cerebral atrophy. The ventricles are in the midline without mass effect or shift. They are normal in size and configuration. No CT findings to suggest an acute hemispheric infarction or intracranial hemorrhage. No mass lesions. The brainstem and cerebellum are grossly normal. Vascular: Stable mild vascular calcifications but no aneurysm or hyperdense vessels. Skull: No acute skull fracture or bone lesions. Sinuses/Orbits: The paranasal sinuses and mastoid air cells are grossly clear. The globes are intact. Other: No scalp lesions or scalp hematoma. CT CERVICAL SPINE FINDINGS Alignment: Normal Skull base and vertebrae: No acute fracture. No primary bone lesion or focal pathologic process. Soft tissues and spinal canal: No prevertebral fluid or swelling. No visible  canal hematoma. Disc levels: Stable degenerative cervical spondylosis with multilevel disc disease and facet disease. The spinal canal is fairly generous in is no significant spinal stenosis. Mild multilevel foraminal stenosis is noted. There is a large left-sided bone spur at  C5-6 with mass effect on the left side of the thecal sac and likely effecting the left C6 nerve root. This appears stable when compared to the prior MR examination. Upper chest: The visualized lung apices are gross

## 2022-01-01 ENCOUNTER — Encounter: Payer: Self-pay | Admitting: Psychiatry

## 2022-01-01 DIAGNOSIS — F332 Major depressive disorder, recurrent severe without psychotic features: Secondary | ICD-10-CM | POA: Diagnosis not present

## 2022-01-01 LAB — GLUCOSE, CAPILLARY
Glucose-Capillary: 126 mg/dL — ABNORMAL HIGH (ref 70–99)
Glucose-Capillary: 104 mg/dL — ABNORMAL HIGH (ref 70–99)
Glucose-Capillary: 107 mg/dL — ABNORMAL HIGH (ref 70–99)
Glucose-Capillary: 115 mg/dL — ABNORMAL HIGH (ref 70–99)

## 2022-01-01 MED ORDER — NICOTINE 14 MG/24HR TD PT24
14.0000 mg | MEDICATED_PATCH | Freq: Every day | TRANSDERMAL | Status: DC
  Administered 2022-01-01 – 2022-01-12 (×12): 14 mg via TRANSDERMAL
  Filled 2022-01-01 (×12): qty 1

## 2022-01-01 MED ORDER — MIRTAZAPINE 15 MG PO TABS
15.0000 mg | ORAL_TABLET | Freq: Every day | ORAL | Status: DC
  Administered 2022-01-01 – 2022-01-03 (×3): 15 mg via ORAL
  Filled 2022-01-01 (×4): qty 1

## 2022-01-01 MED ORDER — TRAZODONE HCL 100 MG PO TABS
100.0000 mg | ORAL_TABLET | Freq: Every evening | ORAL | Status: DC | PRN
  Administered 2022-01-01 – 2022-01-03 (×3): 100 mg via ORAL
  Filled 2022-01-01 (×3): qty 1

## 2022-01-01 NOTE — Group Note (Signed)
[place] LCSW Group Therapy Note ? ? ? ?Group Date: 01/01/2022 ?Start Time: 1430 ?End Time: 7619 ? ?Type of Therapy and Topic:  Group Therapy:  Overcoming Obstacles ? ?Participation Level:  [facility] PARTICIPATION LEVEL: Active ? ? ? ?Description of Group:   ?In this group patients will be encouraged to explore what they see as obstacles to their own wellness and recovery. They will be guided to discuss their thoughts, feelings, and behaviors related to these obstacles. The group will process together ways to cope with barriers, with attention given to specific choices patients can make. Each patient will be challenged to identify changes they are motivated to make in order to overcome their obstacles. This group will be process-oriented, with patients participating in exploration of their own experiences as well as giving and receiving support and challenge from other group members. ? ?Therapeutic Goals: ?1. Patient will identify personal and current obstacles as they relate to admission. ?2. Patient will identify barriers that currently interfere with their wellness or overcoming obstacles.  ?3. Patient will identify feelings, thought process and behaviors related to these barriers. ?4. Patient will identify two changes they are willing to make to overcome these obstacles:  ? ? ?Summary of Patient Progress ? ? ?Patient was present for the entirety of the group session. Patient was an active listener and participated in the topic of discussion, provided helpful advice to others, and added nuance to topic of conversation. CSW led discussion doing a worksheet for change activity pertaining to overcoming obstacles. He stated that he wanted to work on "being less melancholy" and the way he would know he was accomplishing his goal was just by "doing it" and that he wanted to "think positively" as a step that he could take towards accomplishing his goals.  ? ?Therapeutic Modalities:    ?Cognitive Behavioral Therapy ?Solution Focused Therapy ?Motivational Interviewing ?Relapse Prevention Therapy ? ? ?Izamar Linden A [place], LCSWA ?

## 2022-01-01 NOTE — Progress Notes (Signed)
Call received from patient spouse [name]) who stated "I thought it was only the Seroquel, but today I went into his car and open the trunk and found a loaded gun with bullets, ropes and a knife." "I don't know what to do, I don't know his intention whether he wanted to harm [place]." And went on to say " I don't know if I should confront him or not." Writer inform patient spouse that information would be relayed to doctor. Spouse stated " I would like to speak with the doctor." Doctor informed about spouse concerned and request to speak to him. ?

## 2022-01-01 NOTE — Plan of Care (Signed)
Patient remains AOX4, calm and compliant during assessment. Denies pain or discomfort. Endorsed anxiety of 6/10, denies depression. Denies SI, HI, AVH. Ate meals in the day room among peers and tolerated well. Compliant with due medications. Reports not being able to sleep last night. Restless, pacing down the hallway. Remain safe on the unit with Q15 minutes safety checks. ? ?Problem: Education: ?Goal: Knowledge of [facility] Education information/materials will improve ?Outcome: Progressing ?Goal: Emotional status will improve ?Outcome: Progressing ?Goal: Mental status will improve ?Outcome: Progressing ?Goal: Verbalization of understanding the information provided will improve ?Outcome: Progressing ?  ?Problem: Coping: ?Goal: Ability to verbalize frustrations and anger appropriately will improve ?Outcome: Progressing ?  ?Problem: Health Behavior/Discharge Planning: ?Goal: Identification of resources available to assist in meeting health care needs will improve ?Outcome: Progressing ?  ?

## 2022-01-01 NOTE — [facility] Counselor (Signed)
CSW completed SPE with pt's wife, Jeremy Shannon, [phone].   ? ?She stated that pt had a history of taking Xanax more than prescribed and that his provider tapered him off his Xanax and now he only has a 7 day supply of medications and can no longer take Xanax. She believes his issue is his medications. She states that he is "a sneak" and "attempts to cover his tracks."  She also stated that he recently stated talking and mumbling to himself about a month or two months ago but is not sure if he is responding to internal stimuli..  ? ?She stated that pt found a receipt that pt purchased a pistol on the 12th of April, recently and had a rope in the trunk as well as a long kitchen knife. She also stated that pt's father died by suicide when pt was in his twenties and doesn't believe pt has "ever gotten over it." She also stated that pt has no financial stressors as he reported during initial assessment.  ? ?CSW advised [name] to remove the firearm from the home. If needed purchase appropriate safe/lock box to secure weapon safely in home if she plans to keep the firearm. She stated that she is concerned for her safety now that pt has a gun and is "taking his situation too lightly" since it's evident that he had a plan. She is concerned about pt returning home.  ? ?CSW will relay information to provider regarding collateral information.  ? ?Jeremy Shannon, MSW, LCSW-A ?4/22/20234:59 PM  ?

## 2022-01-01 NOTE — H&P (Addendum)
Psychiatric Admission Assessment Adult ? ?Patient Identification: Jeremy Shannon ?MRN:  [id number] ?Date of Evaluation:  01/01/2022 ?Chief Complaint:  Major depressive disorder, recurrent severe without psychotic features ([place]) [F33.2] ?Principal Diagnosis: Major depressive disorder, recurrent severe without psychotic features ([place]) ?Diagnosis:   ?S/P Overdose ?MDD, R, Severe ?Benzodiazepine use disorder, in remission ?DM Type II ? ?History of Present Illness:  ?Jeremy Shannon is a 66 year old male with a history of depression who was urgently brought to the emergency department after he was found down by his daughter following a suspected overdose on 10 tablets of 300 mg Seroquel.  Patient was admitted to the medical floor from April 19 to December 31, 2021, and then transferred to the geriatric psych unit for further evaluation, monitoring, and safekeeping after being deemed medically stable by the medical team and poison control. ? ?Patient insists that he was not trying to kill himself.  States that he woke up around 3:00 in the morning and "was not feeling too hot," felt down for some reason he could not figure it out.  He wanted to go back to sleep and so took extra Seroquel.  "I guess I took too many."  Adds that he was feeling depersonalized, a feeling that he tends to get every 2 weeks or so, lasting a few minutes.  Adamantly indicates several times that he was not trying to kill himself.  "I love myself, love my family and friends."  Fixates on wanting to discharge home today.  He denies a history of suicide attempts and states that this is his first psychiatric hospitalization. ? ?Denies depressive cognitions as well as symptoms of anxiety or irritability.  Denies homicidal thoughts.  Denies any challenges or stressors in life.  Denies symptoms of mania psychosis OCD and PTSD. ? ?States that he sees Dr. Ysidro Evert at [facility] and has been on Seroquel for the past 8 months with good benefit with anxiety,  depression and sleep.  Does not recall any other medications that has been on besides Xanax.  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 ? ?Patient's wife called the unit and indicated that she found a loaded gun, rope and a knife in the trunk of patient's car. ? ?Negative ?Total Time spent with patient: 1.5 hours ? ?Past Psychiatric History: No psych admission, per pt.  ? ?Is the patient at risk to self? Yes.    ?Has the patient been a risk to self in the past 6 months? Yes.    ?Has the patient been a risk to self within the distant past? No.  ?Is the patient a risk to others? No.  ?Has the patient been a risk to others in the past 6 months? No.  ?Has the patient been a risk to others within the distant past? No.  ? ?Prior Inpatient Therapy:   ?Prior Outpatient Therapy:   ? ?Alcohol Screening: 1. How often do you have a drink containing alcohol?: Never ?2. How many drinks containing alcohol do you have on a typical day when you are drinking?: 1 or 2 ?3. How often do you have six or more drinks on one occasion?: Never ?AUDIT-C Score: 0 ?4. How often during the last year have you found that you were not able to stop drinking once you had started?: Never ?5. How often during the last year have you failed to do what was normally expected  from you because of drinking?: Never ?6. How often during the last year have you needed a first drink in the morning to get yourself going after a heavy drinking session?: Never ?7. How often during the last year have you had a feeling of guilt of remorse after drinking?: Never ?8. How often during the last year have you been unable to remember what happened the night before because you had been drinking?: Never ?9. Have you or someone else been injured as a result of your drinking?: No ?10. Has a relative or friend or a doctor or another health worker been concerned about your drinking or suggested you cut  down?: No ?Alcohol Use Disorder Identification Test Final Score (AUDIT): 0 ?Substance Abuse History in the last 12 months:  No. ?Consequences of Substance Abuse: ?Negative ?Previous Psychotropic Medications: No  ?Psychological Evaluations: No  ?Past Medical History:  ?Past Medical History:  ?Diagnosis Date  ? Anemia   ? pt denies  ? Anxiety   ? Cataract   ? Depression   ? Diabetes mellitus without complication ([place])   ? type 2  ? Dyspnea   ? Hyperlipidemia   ? PONV (postoperative nausea and vomiting)   ?  ?Past Surgical History:  ?Procedure Laterality Date  ? BRONCHIAL BIOPSY  02/16/2021  ? Procedure: BRONCHIAL BIOPSIES;  Surgeon: Garner Nash, DO;  Location: [facility];  Service: Pulmonary;;  ? BRONCHIAL BRUSHINGS  02/16/2021  ? Procedure: BRONCHIAL BRUSHINGS;  Surgeon: Garner Nash, DO;  Location: [place];  Service: Pulmonary;;  ? BRONCHIAL NEEDLE ASPIRATION BIOPSY  02/16/2021  ? Procedure: BRONCHIAL NEEDLE ASPIRATION BIOPSIES;  Surgeon: Garner Nash, DO;  Location: [place] [facility];  Service: Pulmonary;;  ? BRONCHIAL WASHINGS  02/16/2021  ? Procedure: BRONCHIAL WASHINGS;  Surgeon: Garner Nash, DO;  Location: [facility];  Service: Pulmonary;;  ? CATARACT EXTRACTION    ? bilateral  ? CHOLECYSTECTOMY N/A 02/10/2017  ? Procedure: LAPAROSCOPIC CHOLECYSTECTOMY WITH INTRAOPERATIVE CHOLANGIOGRAM;  Surgeon: Alphonsa Overall, MD;  Location: [facility];  Service: General;  Laterality: N/A;  ? COLONOSCOPY    ? FIDUCIAL MARKER PLACEMENT  02/16/2021  ? Procedure: FIDUCIAL MARKER PLACEMENT;  Surgeon: Garner Nash, DO;  Location: [facility];  Service: Pulmonary;;  ? VIDEO BRONCHOSCOPY WITH ENDOBRONCHIAL NAVIGATION Right 02/16/2021  ? Procedure: VIDEO BRONCHOSCOPY WITH ENDOBRONCHIAL NAVIGATION;  Surgeon: Garner Nash, DO;  Location: [facility];  Service: Pulmonary;  Laterality: Right;  ? ?Family History:  ?Family History  ?Problem Relation Age of Onset  ? Liver cancer Mother   ? Depression Father   ? Drug abuse  Father   ? ?Family Psychiatric  History: None ?Tobacco Screening:   ?Social History:  ?Social History  ? ?Substance and Sexual Activity  ?Alcohol Use No  ?   ?Social History  ? ?Substance and Sexual Activity  ?Drug Use No  ?  ?Additional Social History: ?Marital status: Married ?Number of Years Married: 81 (8 years) ?What types of issues is patient dealing with in the relationship?: Pt states that he has a good relationship ?Are you sexually active?: Yes ?What is your sexual orientation?: Heterosexual ?Has your sexual activity been affected by drugs, alcohol, medication, or emotional stress?: Pt denies ?Does patient have children?: Yes ?How many children?: 1 (daughter) ?How is patient's relationship with their children?: Pt states that he has a good relationship with his child ?   ?  ?  ?  ?  ?  ?  ?  ?  ?  ?  ? ?  Allergies:  No Known Allergies ?Lab Results:  ?Results for orders placed or performed during the hospital encounter of 12/31/21 (from the past 48 hour(s))  ?Hemoglobin A1c     Status: Abnormal  ? Collection Time: 12/31/21  4:29 PM  ?Result Value Ref Range  ? Hgb A1c MFr Bld 5.8 (H) 4.8 - 5.6 %  ?  Comment: (NOTE) ?Pre diabetes:          5.7%-6.4% ? ?Diabetes:              >6.4% ? ?Glycemic control for   <7.0% ?adults with diabetes ?  ? Mean Plasma Glucose 119.76 mg/dL  ?  Comment: Performed at [facility], [place] [address], [place], [place] [address]  ?Glucose, capillary     Status: Abnormal  ? Collection Time: 12/31/21  4:30 PM  ?Result Value Ref Range  ? Glucose-Capillary 117 (H) 70 - 99 mg/dL  ?  Comment: Glucose reference range applies only to samples taken after fasting for at least 8 hours.  ?Glucose, capillary     Status: Abnormal  ? Collection Time: 12/31/21  8:08 PM  ?Result Value Ref Range  ? Glucose-Capillary 110 (H) 70 - 99 mg/dL  ?  Comment: Glucose reference range applies only to samples taken after fasting for at least 8 hours.  ?Glucose, capillary     Status: Abnormal  ? Collection  Time: 01/01/22  8:18 AM  ?Result Value Ref Range  ? Glucose-Capillary 126 (H) 70 - 99 mg/dL  ?  Comment: Glucose reference range applies only to samples taken after fasting for at least 8 hours.  ?Glucose, capillar

## 2022-01-01 NOTE — BHH Suicide Risk Assessment (Signed)
[facility] Admission Suicide Risk Assessment ? ? ?Nursing information obtained from:  Patient ?Demographic factors:  Male, Age 66 or older ?Current Mental Status:  NA ?Loss Factors:  NA ?Historical Factors:  NA ?Risk Reduction Factors:  NA ? ?Total Time spent with patient: 1.5 hours ?Principal Problem: Major depressive disorder, recurrent severe without psychotic features ([place]) ?Diagnosis:  Principal Problem: ?  Major depressive disorder, recurrent severe without psychotic features ([place]) ? ?Subjective Data:  ?Jeremy Shannon is a 66 year old male with a history of depression who was urgently brought to the emergency department after he was found down by his daughter following a suspected overdose on 10 tablets of 300 mg Seroquel.  Patient was admitted to the medical floor from April 19 to December 31, 2021, and then transferred to the geriatric psych unit for further evaluation, monitoring, and safekeeping after being deemed medically stable by the medical team and poison control. ?  ?Patient insists that he was not trying to kill himself.  States that he woke up around 3:00 in the morning and "was not feeling too hot," felt down for some reason he could not figure it out.  He wanted to go back to sleep and so took extra Seroquel.  "I guess I took too many."  Adds that he was feeling depersonalized, a feeling that he tends to get every 2 weeks or so, lasting a few minutes.  Adamantly indicates several times that he was not trying to kill himself.  "I love myself, love my family and friends."  Fixates on wanting to discharge home today.  He denies a history of suicide attempts and states that this is his first psychiatric hospitalization. ?  ?Denies depressive cognitions as well as symptoms of anxiety or irritability.  Denies homicidal thoughts.  Denies any challenges or stressors in life.  Denies symptoms of mania psychosis OCD and PTSD. ?  ?States that he sees Dr. Ysidro Evert at [facility] and has been on Seroquel for the past 8  months with good benefit with anxiety, depression and sleep.  Does not recall any other medications that has been on besides Xanax.  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 ?  ?Patient's wife called the unit and indicated that she found a loaded gun, rope and a knife in the trunk of patient's car. ?  ? ?Continued Clinical Symptoms:  ?Alcohol Use Disorder Identification Test Final Score (AUDIT): 0 ?The "Alcohol Use Disorders Identification Test", Guidelines for Use in Primary Care, Second Edition.  World [facility]). ?Score between 0-7:  no or low risk or alcohol related problems. ?Score between 8-15:  moderate risk of alcohol related problems. ?Score between 16-19:  high risk of alcohol related problems. ?Score 20 or above:  warrants further diagnostic evaluation for alcohol dependence and treatment. ? ? ?CLINICAL FACTORS:  ? Bipolar Disorder:   Bipolar II ? ? ?Musculoskeletal: ?Strength & Muscle Tone: within normal limits ?Gait & Station: normal ?Patient leans: Right ? ?Psychiatric Specialty Exam: ? ?Presentation  ?General Appearance: Appropriate for Environment; Disheveled ? ?Eye Contact:Fair ? ?Speech:Clear and Coherent ? ?Speech Volume:Normal ? ?Handedness:Right ? ? ?Mood and Affect  ?Mood:Anxious ? ?Affect:Appropriate; Congruent ? ? ?Thought Process  ?Thought Processes:Coherent; Linear ? ?Descriptions of Associations:Intact ? ?Orientation:Full (Time, Place and Person) ? ?Thought Content:Logical ? ?History of Schizophrenia/Schizoaffective disorder:No data recorded ?Duration of Psychotic Symptoms:No data recorded ?Hallucinations:No data recorded ?Ideas of  Reference:None ? ?Suicidal Thoughts:No data recorded ?Homicidal Thoughts:No data recorded ? ?Sensorium  ?Memory:Immediate Good; Recent Fair; Remote Fair ? ?Judgment:Fair ? ?Insight:Fair ? ? ?Executive Functions  ?Concentration:Fair ? ?Attention  Span:Fair ? ?Recall:Fair ? ?[place] ? ?Language:Fair ? ? ?Psychomotor Activity  ?Psychomotor Activity:No data recorded ? ?Assets  ?Assets:Communication Skills; Desire for Improvement; Financial Resources/Insurance; Housing; Intimacy; Physical Health; Resilience; Social Support ? ? ?Sleep  ?Sleep:No data recorded ? ? ?Physical Exam: ?Physical Exam ?ROS ?Blood pressure (!) 134/93, pulse 99, temperature 97.8 ?F (36.6 ?C), temperature source Oral, resp. rate 18, height '5\' 8"'$  (1.727 m), weight 89.4 kg, SpO2 97 %. Body mass index is 29.95 kg/m?. ? ? ?COGNITIVE FEATURES THAT CONTRIBUTE TO RISK:  ?Closed-mindedness   ? ?SUICIDE RISK:  ? Severe:  Frequent, intense, and enduring suicidal ideation, specific plans, clear subjective and objective intent, impaired self-control, severe dysphoria/symptomatology, many risk factors and no protective factors. ? ?PLAN OF CARE:  ?Will place the patient on IVC today as he has poor insight into recent events and psychopathology ?Obtain collateral information ?Hold Seroquel for the time being ?Request outpatient medical records from Dr. Ysidro Evert at [facility] ?Start Remeron 15 mg p.o. nightly and risk benefits side effects and alternatives reviewed.  Patient voiced agreement are standing. ?Have trazodone 100 mg p.o. nightly as needed insomnia ?Monitor blood glucose ? ?I certify that inpatient services furnished can reasonably be expected to improve the patient's condition.  ? ?Rulon Sera, MD ?01/01/2022, 1:53 PM ? ?

## 2022-01-01 NOTE — Progress Notes (Signed)
Wife called to report she searched her husband's car and found a gun, bullets, and a rope. Gun receipt indicates this was purchased recently. Wife believes this indicates seriousness of suicide intent. She will call the [facility] unit and report findings to patient's nurse.  ?Clayborne Dana, rN  ?

## 2022-01-01 NOTE — BHH Counselor (Signed)
Adult Comprehensive Assessment ? ?Patient ID: Jeremy Shannon, male   DOB: April 15, 1956, 66 y.o.   MRN: [id number] ? ?Information Source: ?Information source: Patient ? ?Current Stressors:  ?Patient states their primary concerns and needs for treatment are:: "took 11 seroquel pills" ?Patient states their goals for this hospitilization and ongoing recovery are:: "to go home" ?Educational / Learning stressors: Pt denies ?Employment / Job issues: Pt denies ?Family Relationships: Pt denies ?Financial / Lack of resources (include bankruptcy): Pt denies ?Housing / Lack of housing: Pt denies ?Physical health (include injuries & life threatening diseases): "I smoke" ?Social relationships: Pt denies ?Substance abuse: Pt states that he does not drink but does smoke ?Bereavement / Loss: Pt states that his mother died in 12-24-2019 ? ?Living/Environment/Situation:  ?Living Arrangements: Spouse/significant other ?Living conditions (as described by patient or guardian): "great" ?Who else lives in the home?: Pt's daughter ?How long has patient lived in current situation?: "about 3 years, live in my mother's house" ?What is atmosphere in current home: Loving, Supportive ? ?Family History:  ?Marital status: Married ?Number of Years Married: 68 (61 years) ?What types of issues is patient dealing with in the relationship?: Pt states that he has a good relationship ?Are you sexually active?: Yes ?What is your sexual orientation?: Heterosexual ?Has your sexual activity been affected by drugs, alcohol, medication, or emotional stress?: Pt denies ?Does patient have children?: Yes ?How many children?: 1 (daughter) ?How is patient's relationship with their children?: Pt states that he has a good relationship with his child ? ?Childhood History:  ?By whom was/is the patient raised?: Both parents ?Description of patient's relationship with caregiver when they were a child: "Parents were great" ?Patient's description of current relationship with  people who raised him/her: Deceased ?How were you disciplined when you got in trouble as a child/adolescent?: Pt denies ?Does patient have siblings?: Yes ?Number of Siblings: 1 (brother) ?Description of patient's current relationship with siblings: Pt states that he has a fine relationship with his brother ?Did patient suffer any verbal/emotional/physical/sexual abuse as a child?: No ?Did patient suffer from severe childhood neglect?: No ?Has patient ever been sexually abused/assaulted/raped as an adolescent or adult?: No ?Was the patient ever a victim of a crime or a disaster?: No ?Witnessed domestic violence?: No ?Has patient been affected by domestic violence as an adult?: No ? ?Education:  ?Highest grade of school patient has completed: 2 years of college ?Currently a student?: No ?Learning disability?: No ? ?Employment/Work Situation:   ?Employment Situation: Retired ?Patient's Job has Been Impacted by Current Illness: No ?What is the Longest Time Patient has Held a Job?: 5 years ?Where was the Patient Employed at that Time?: "did all types of jobs" ?Has Patient ever Been in the Military?: No ? ?Financial Resources:   ?Museum/gallery curator resources: [employer], Medicare ?Does patient have a representative payee or guardian?: No ? ?Alcohol/Substance Abuse:   ?What has been your use of drugs/alcohol within the last 12 months?: Pt denies any alcohol use but states that he smokes at least a pack of cigarettes every day ?If attempted suicide, did drugs/alcohol play a role in this?: No ?Alcohol/Substance Abuse Treatment Hx: Denies past history ?Has alcohol/substance abuse ever caused legal problems?: No ? ?Social Support System:   ?Patient's Community Support System: Good ?Describe Community Support System: Daughter, wife, cousins, friends ?Type of faith/religion: Pt states that he grew up [place] but that he does not really go to church ?How does patient's faith help to cope with current illness?:  Pt  denies ? ?Leisure/Recreation:   ?Do You Have Hobbies?: Yes ?Leisure and Hobbies: "goes hunting, look for arrow heads, go to the [place] ? ?Strengths/Needs:   ?What is the patient's perception of their strengths?: "always have been a personable person" ?Patient states they can use these personal strengths during their treatment to contribute to their recovery: "good with friends" ?Patient states these barriers may affect/interfere with their treatment: Pt denies ?Patient states these barriers may affect their return to the community: Pt denies ?Other important information patient would like considered in planning for their treatment: "haven't been getting good sleep" ? ?Discharge Plan:   ?Currently receiving community mental health services: Yes (From Whom) Alcide Clever, PA-C, [facility]) ?Patient states concerns and preferences for aftercare planning are: Pt states that he is open to a referral for therapy ?Patient states they will know when they are safe and ready for discharge when: "ready now, I have too much to live for now" ?Does patient have access to transportation?: Yes ?Does patient have financial barriers related to discharge medications?: No ?Will patient be returning to same living situation after discharge?: Yes ? ?Summary/Recommendations:   ?Summary and Recommendations (to be completed by the evaluator): Patient is a 66 year old male, married, from [place], [place] ([place]). He reports that he receives SSI and is retired.  He presents to the hospital following suicide attempt. Recent stressors per chart review include pt's history of depersonalization, financial stressors, and relationsip stress, and pt reports poor sleep.  He has a primary diagnosis of Major depressive disorder, recurrent severe without psychotic features. Patient is anxious but coopertive during assessment. Patient is minimizing attempt. Per nurse note 01/01/22, pt's spouse recently found a knife, rope and loaded  gun in the trunk of pt's car. Patient has poor insight. Patient has medication managment through [facility], with PA-C Eino Farber. Patient is open to referral for [facility]. Recommendations include: crisis stabilization, therapeutic milieu, encourage group attendance and participation, medication management for mood stabilization and development of comprehensive mental wellness plan. ? ?Gaelen Brager A [place]. 01/01/2022 ?

## 2022-01-01 NOTE — BHH Suicide Risk Assessment (Signed)
BHH INPATIENT:  Family/Significant Other Suicide Prevention Education ? ?Suicide Prevention Education:  ?Education Completed; [name],  (name of family member/significant other) has been identified by the patient as the family member/significant other with whom the patient will be residing, and identified as the person(s) who will aid the patient in the event of a mental health crisis (suicidal ideations/suicide attempt).  With written consent from the patient, the family member/significant other has been provided the following suicide prevention education, prior to the and/or following the discharge of the patient. ? ?The suicide prevention education provided includes the following: ?Suicide risk factors ?Suicide prevention and interventions ?National Suicide Hotline telephone number ?[facility] assessment telephone number ?[facility] Emergency Assistance 911 ?[place] and/or Residential Mobile Crisis Unit telephone number ? ?Request made of family/significant other to: ?Remove weapons (e.g., guns, rifles, knives), all items previously/currently identified as safety concern.   ?Remove drugs/medications (over-the-counter, prescriptions, illicit drugs), all items previously/currently identified as a safety concern. ? ?The family member/significant other verbalizes understanding of the suicide prevention education information provided.  The family member/significant other agrees to remove the items of safety concern listed above. ? ?Melven Stockard A [place] ?01/01/2022, 4:22 PM ?

## 2022-01-01 NOTE — Plan of Care (Signed)
Patient presents Anxious and Restless. Patient mood is Flat.  Patient denies SI/HI/Avh and contracts for safety.  Patient discharged focused.  Patient did verbalize regret for Suicide Attempt. "They think I was trying kill my self I was just wanting to rest". Patient reports poor food intake and poor sleep. Patient denies Depression and rates Anxiety 5/10.  Medication given to manage symptoms.  Encouragement ?Problem: Education: ?Goal: Knowledge of [facility] [facility] Education information/materials will improve ?Outcome: Progressing ?  ?Problem: Education: ?Goal: Emotional status will improve ?Outcome: Not Progressing ?Goal: Mental status will improve ?Outcome: Not Progressing ?  and support given. Q 15 minute safety rounding in place.  ?

## 2022-01-02 DIAGNOSIS — F332 Major depressive disorder, recurrent severe without psychotic features: Secondary | ICD-10-CM | POA: Diagnosis not present

## 2022-01-02 LAB — GLUCOSE, CAPILLARY
Glucose-Capillary: 114 mg/dL — ABNORMAL HIGH (ref 70–99)
Glucose-Capillary: 108 mg/dL — ABNORMAL HIGH (ref 70–99)
Glucose-Capillary: 89 mg/dL (ref 70–99)
Glucose-Capillary: 113 mg/dL — ABNORMAL HIGH (ref 70–99)
Glucose-Capillary: 135 mg/dL — ABNORMAL HIGH (ref 70–99)

## 2022-01-02 MED ORDER — GABAPENTIN 300 MG PO CAPS
300.0000 mg | ORAL_CAPSULE | Freq: Three times a day (TID) | ORAL | Status: DC | PRN
Start: 2022-01-02 — End: 2022-01-04
  Administered 2022-01-04: 300 mg via ORAL
  Filled 2022-01-02: qty 1

## 2022-01-02 NOTE — Progress Notes (Signed)
[facility] MD Progress Note ? ?01/02/2022 11:19 AM ?Jeremy Shannon  ?MRN:  [id number] ?Subjective:   ? ?Today, patient reports "I am ready to go."  Adds "I cannot believe I did that."  He is somewhat dismissive and nonchalant about events, minimizes.  He does acknowledge buying a gun and a rope recently.  "I was not going to do anything with it.  I [place] it out to the country and was shooting it."  He does admit that his father committed suicide when he was 73 years old, contends that it was hard on him but "I got over it."  Does not add any additional information.  Reports depression to be a 3 out of 10, reports anxiety to be a 6 out of 10.  Requests a as needed medication for anxiety.  Indicates that hydroxyzine does not help..  Indicates that he slept better last night, approximately 3 to 4 hours which is his normal.  Denies any new medical issues.  Denies thoughts of suicide.  Reflects after he took the 10 tablets of Seroquel, he went outside and smoked a cigarette and and thought to himself "Lord, hope I do not die."  At that time, he did not inform anyone that he had taken the pills.  States "I do not know what I was going to do with the rope.. . I just bought it." ? ?SW NOTE, 01/01/2022- Collateral information from pt's wife: ?CSW completed SPE with pt's wife, [name], [phone].   ?  ?She stated that pt had a history of taking Xanax more than prescribed and that his provider tapered him off his Xanax and now he only has a 7 day supply of medications and can no longer take Xanax. She believes his issue is his medications. She states that he is "a sneak" and "attempts to cover his tracks."  She also stated that he recently stated talking and mumbling to himself about a month or two months ago but is not sure if he is responding to internal stimuli..  ?  ?She stated that pt found a receipt that pt purchased a pistol on the 12th of April, recently and had a rope in the trunk as well as a long kitchen knife.  She also stated that pt's father died by suicide when pt was in his twenties and doesn't believe pt has "ever gotten over it." She also stated that pt has no financial stressors as he reported during initial assessment.  ?  ?CSW advised [name] to remove the firearm from the home. If needed purchase appropriate safe/lock box to secure weapon safely in home if she plans to keep the firearm. She stated that she is concerned for her safety now that pt has a gun and is "taking his situation too lightly" since it's evident that he had a plan. She is concerned about pt returning home.  ?  ?CSW will relay information to provider regarding collateral information.  ?  ? ? ?Principal Problem: Major depressive disorder, recurrent severe without psychotic features ([place]) ?Diagnosis: Principal Problem: ?  Major depressive disorder, recurrent severe without psychotic features ([place]) ? ?Total Time spent with patient: 30 minutes ? ? ?Past Medical History:  ?Past Medical History:  ?Diagnosis Date  ? Anemia   ? pt denies  ? Anxiety   ? Cataract   ? Depression   ? Diabetes mellitus without complication ([place])   ? type 2  ? Dyspnea   ? Hyperlipidemia   ? PONV (postoperative nausea  and vomiting)   ?  ?Past Surgical History:  ?Procedure Laterality Date  ? BRONCHIAL BIOPSY  02/16/2021  ? Procedure: BRONCHIAL BIOPSIES;  Surgeon: Garner Nash, DO;  Location: [facility];  Service: Pulmonary;;  ? BRONCHIAL BRUSHINGS  02/16/2021  ? Procedure: BRONCHIAL BRUSHINGS;  Surgeon: Garner Nash, DO;  Location: [facility];  Service: Pulmonary;;  ? BRONCHIAL NEEDLE ASPIRATION BIOPSY  02/16/2021  ? Procedure: BRONCHIAL NEEDLE ASPIRATION BIOPSIES;  Surgeon: Garner Nash, DO;  Location: [facility];  Service: Pulmonary;;  ? BRONCHIAL WASHINGS  02/16/2021  ? Procedure: BRONCHIAL WASHINGS;  Surgeon: Garner Nash, DO;  Location: [facility];  Service: Pulmonary;;  ? CATARACT EXTRACTION    ? bilateral  ? CHOLECYSTECTOMY N/A 02/10/2017  ? Procedure:  LAPAROSCOPIC CHOLECYSTECTOMY WITH INTRAOPERATIVE CHOLANGIOGRAM;  Surgeon: Alphonsa Overall, MD;  Location: [facility];  Service: General;  Laterality: N/A;  ? COLONOSCOPY    ? FIDUCIAL MARKER PLACEMENT  02/16/2021  ? Procedure: FIDUCIAL MARKER PLACEMENT;  Surgeon: Garner Nash, DO;  Location: [facility];  Service: Pulmonary;;  ? VIDEO BRONCHOSCOPY WITH ENDOBRONCHIAL NAVIGATION Right 02/16/2021  ? Procedure: VIDEO BRONCHOSCOPY WITH ENDOBRONCHIAL NAVIGATION;  Surgeon: Garner Nash, DO;  Location: [facility] [place];  Service: Pulmonary;  Laterality: Right;  ? ?Family History:  ?Family History  ?Problem Relation Age of Onset  ? Liver cancer Mother   ? Depression Father   ? Drug abuse Father   ? ?Social History:  ?Social History  ? ?Substance and Sexual Activity  ?Alcohol Use No  ?   ?Social History  ? ?Substance and Sexual Activity  ?Drug Use No  ?  ?Social History  ? ?Socioeconomic History  ? Marital status: Married  ?  Spouse name: Not on file  ? Number of children: Not on file  ? Years of education: Not on file  ? Highest education level: Not on file  ?Occupational History  ? Not on file  ?Tobacco Use  ? Smoking status: Every Day  ?  Packs/day: 1.00  ?  Years: 43.00  ?  Pack years: 43.00  ?  Types: Cigarettes  ?  Passive exposure: Current  ? Smokeless tobacco: Never  ? Tobacco comments:  ?  10-20 cigarettes smoked daily 02/17/2021  ?Vaping Use  ? Vaping Use: Never used  ?Substance and Sexual Activity  ? Alcohol use: No  ? Drug use: No  ? Sexual activity: Yes  ?  Partners: Female  ?  Birth control/protection: None  ?Other Topics Concern  ? Not on file  ?Social History Narrative  ? Patient is currently a smoker and states he is not ready to quit  ? ?Social Determinants of Health  ? ?Financial Resource Strain: Not on file  ?Food Insecurity: Not on file  ?Transportation Needs: Not on file  ?Physical Activity: Not on file  ?Stress: Not on file  ?Social Connections: Not on file  ? ?Additional Social History:  ?  ?  ?  ?  ?  ?  ?   ?  ?  ?  ?  ? ?Sleep: Good ? ?Appetite:  Fair ? ?Current Medications: ?Current Facility-Administered Medications  ?Medication Dose Route Frequency Provider Last Rate Last Admin  ? acetaminophen (TYLENOL) tablet 650 mg  650 mg Oral Q6H PRN Patrecia Pour, NP      ? alum & mag hydroxide-simeth (MAALOX/MYLANTA) 200-200-20 MG/5ML suspension 30 mL  30 mL Oral Q4H PRN Patrecia Pour, NP      ? empagliflozin (JARDIANCE) tablet  10 mg  10 mg Oral Daily Patrecia Pour, NP   10 mg at 01/02/22 5397  ? hydrOXYzine (ATARAX) tablet 50 mg  50 mg Oral Q6H PRN Deloria Lair, NP   50 mg at 12/31/21 2147  ? icosapent Ethyl (VASCEPA) 1 g capsule 1 g  1 g Oral BID Patrecia Pour, NP   1 g at 01/02/22 6734  ? insulin aspart (novoLOG) injection 0-15 Units  0-15 Units Subcutaneous TID WC Patrecia Pour, NP   2 Units at 01/01/22 1937  ? magnesium hydroxide (MILK OF MAGNESIA) suspension 30 mL  30 mL Oral Daily PRN Patrecia Pour, NP      ? mirtazapine (REMERON) tablet 15 mg  15 mg Oral QHS Rulon Sera, MD   15 mg at 01/01/22 2102  ? nicotine (NICODERM CQ - dosed in mg/24 hours) patch 14 mg  14 mg Transdermal Daily Parks Ranger, DO   14 mg at 01/02/22 1027  ? simvastatin (ZOCOR) tablet 20 mg  20 mg Oral QHS Patrecia Pour, NP   20 mg at 01/01/22 2101  ? tamsulosin (FLOMAX) capsule 0.4 mg  0.4 mg Oral Daily Patrecia Pour, NP   0.4 mg at 01/02/22 9024  ? traZODone (DESYREL) tablet 100 mg  100 mg Oral QHS PRN Rulon Sera, MD   100 mg at 01/01/22 2102  ? ? ?Lab Results:  ?Results for orders placed or performed during the hospital encounter of 12/31/21 (from the past 48 hour(s))  ?Hemoglobin A1c     Status: Abnormal  ? Collection Time: 12/31/21  4:29 PM  ?Result Value Ref Range  ? Hgb A1c MFr Bld 5.8 (H) 4.8 - 5.6 %  ?  Comment: (NOTE) ?Pre diabetes:          5.7%-6.4% ? ?Diabetes:              >6.4% ? ?Glycemic control for   <7.0% ?adults with diabetes ?  ? Mean Plasma Glucose 119.76 mg/dL  ?  Comment: Performed at [facility], [place] [address], [place], [place] [address]  ?Glucose, capillary     Status: Abnormal  ? Collection Time: 12/31/21  4:30 PM  ?Result Value Ref Range  ? Glucose-Capillary 117 (H) 70 - 99 mg/d

## 2022-01-02 NOTE — Plan of Care (Signed)
Patient presents Anxious and Flat.  Patient denies SI/HI/AVH and contracts for safety.  Patient did verbalize 7/10 Anxiety. Patient verbalized "I am bored and I am ready to go". Encouraged pt to shower to help feel more comfortable but pt declined. Pt did comply  with scheduled meds.  Patient c/o not getting any sleep. Pt denies positive appetite. Q 15 minute safety checks in place. Informed pt to notify nursing staff if any needs arise.  ? ? ?Problem: Education: ?Goal: Knowledge of [place] [facility] Education information/materials will improve ?Outcome: Progressing ?Goal: Emotional status will improve ?Outcome: Progressing ?Goal: Mental status will improve ?Outcome: Progressing ?Goal: Verbalization of understanding the information provided will improve ?Outcome: Progressing ?  ?Problem: Coping: ?Goal: Ability to verbalize frustrations and anger appropriately will improve ?Outcome: Progressing ?  ?

## 2022-01-02 NOTE — Plan of Care (Incomplete)
Patient remains alert and oriented, calm and cooperative. Reports sleeping a little. Denies depression but endorsed anxiety of 6/10  ?Problem: Education: ?Goal: Knowledge of [facility] Education information/materials will improve ?Outcome: Progressing ?Goal: Emotional status will improve ?Outcome: Progressing ?Goal: Mental status will improve ?Outcome: Progressing ?Goal: Verbalization of understanding the information provided will improve ?Outcome: Progressing ?  ?Problem: Coping: ?Goal: Ability to verbalize frustrations and anger appropriately will improve ?Outcome: Progressing ?  ?Problem: Health Behavior/Discharge Planning: ?Goal: Identification of resources available to assist in meeting health care needs will improve ?Outcome: Progressing ?  ?

## 2022-01-02 NOTE — Plan of Care (Signed)
Patient remain alert and oriented. Calm and cooperative during assessment. Reports sleeping a little better last night. Denies depression but endorsed anxiety of 6/10. Denies SI, HI and AVH. Patient constantly walking up to the nurse's station requesting for xanax. Patient stated "I'm anxious" and rate his anxiety a 6/10. PRN Atarax offered patient stated " That won't do anything. Patient took the atarax but still saying it will not work for him, that he want something stronger. Ate meals in the day room among peers. Reports poor appetite. Paces up and down the hallway. Remain safe on the unit with Q15 minute safety checks. ? ?Problem: Education: ?Goal: Knowledge of [facility] Education information/materials will improve ?01/02/2022 1810 by Husayn Reim, RN ?Outcome: Progressing ?01/02/2022 1606 by Dalaney Needle, RN ?Outcome: Progressing ?Goal: Emotional status will improve ?01/02/2022 1810 by Alejandro Gamel, RN ?Outcome: Progressing ?01/02/2022 1606 by Sally-Ann Cutbirth, RN ?Outcome: Progressing ?Goal: Mental status will improve ?01/02/2022 1810 by Youa Deloney, RN ?Outcome: Progressing ?01/02/2022 1606 by Parminder Trapani, RN ?Outcome: Progressing ?Goal: Verbalization of understanding the information provided will improve ?01/02/2022 1810 by Zoha Spranger, RN ?Outcome: Progressing ?01/02/2022 1606 by Camille Thau, RN ?Outcome: Progressing ?  ?Problem: Coping: ?Goal: Ability to verbalize frustrations and anger appropriately will improve ?01/02/2022 1810 by Zarai Orsborn, RN ?Outcome: Progressing ?01/02/2022 1606 by Ishani Goldwasser, RN ?Outcome: Progressing ?  ?Problem: Health Behavior/Discharge Planning: ?Goal: Identification of resources available to assist in meeting health care needs will improve ?01/02/2022 1810 by Robertine Kipper, RN ?Outcome: Progressing ?01/02/2022 1606 by Patt Steinhardt, RN ?Outcome: Progressing ?  ?

## 2022-01-02 NOTE — Group Note (Signed)
LCSW Group Therapy Note ? ?Group Date: 01/02/2022 ?Start Time: 1300 ?End Time: 1400 ? ? ?Type of Therapy and Topic:  Group Therapy - How To Cope with Nervousness about Discharge  ? ?Participation Level:  Did Not Attend  ? ?Description of Group ?This process group involved identification of patients' feelings about discharge. Some of them are scheduled to be discharged soon, while others are new admissions, but each of them was asked to share thoughts and feelings surrounding discharge from the hospital. One common theme was that they are excited at the prospect of going home, while another was that many of them are apprehensive about sharing why they were hospitalized. Patients were given the opportunity to discuss these feelings with their peers in preparation for discharge. ? ?Therapeutic Goals ? ?Patient will identify their overall feelings about pending discharge. ?Patient will think about how they might proactively address issues that they believe will once again arise once they get home (i.e. with parents). ?Patients will participate in discussion about having hope for change. ? ? ?Summary of Patient Progress:   ?Patient did not attend group despite encouraged participation.  ? ? ?Therapeutic Modalities ?Cognitive Behavioral Therapy ? ? ?Durenda Hurt, LCSWA ?01/02/2022  2:39 PM   ?

## 2022-01-03 DIAGNOSIS — F332 Major depressive disorder, recurrent severe without psychotic features: Secondary | ICD-10-CM | POA: Diagnosis not present

## 2022-01-03 LAB — GLUCOSE, CAPILLARY
Glucose-Capillary: 102 mg/dL — ABNORMAL HIGH (ref 70–99)
Glucose-Capillary: 106 mg/dL — ABNORMAL HIGH (ref 70–99)
Glucose-Capillary: 99 mg/dL (ref 70–99)
Glucose-Capillary: 111 mg/dL — ABNORMAL HIGH (ref 70–99)

## 2022-01-03 MED ORDER — FLUOXETINE HCL 20 MG PO CAPS
20.0000 mg | ORAL_CAPSULE | Freq: Every day | ORAL | Status: DC
Start: 2022-01-03 — End: 2022-01-12
  Administered 2022-01-03 – 2022-01-12 (×10): 20 mg via ORAL
  Filled 2022-01-03 (×10): qty 1

## 2022-01-03 MED ORDER — RISPERIDONE 1 MG PO TABS
0.5000 mg | ORAL_TABLET | ORAL | Status: DC
  Administered 2022-01-03 – 2022-01-05 (×5): 0.5 mg via ORAL
  Filled 2022-01-03 (×5): qty 1

## 2022-01-03 NOTE — Progress Notes (Signed)
[facility] MD Progress Note ? ?01/03/2022 12:24 PM ?Jeremy Shannon  ?MRN:  [id number] ?Subjective: Met with Jeremy Shannon today on rounds and treatment team.  He states that he does not know why he took an overdose.  Apparently, he also bought a gun and it broke.  He states he was not going to do anything with that.  He currently lives with his wife and daughter states things are well at home.  He has been on Xanax in the past and apparently was taken off of it.  He states that he does have bad anxiety.  Looks like he last filled it in October 2022.  He denies any suicidal ideation.  He states the Vistaril did help him a little bit.  He was agreeable to starting new medications that might help with his depression and anxiety.  Social work is going to be in touch with the family. ? ?Principal Problem: Major depressive disorder, recurrent severe without psychotic features ([place]) ?Diagnosis: Principal Problem: ?  Major depressive disorder, recurrent severe without psychotic features ([place]) ? ?Total Time spent with patient: 15 minutes ? 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  He has been on Rexulti.  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  States he goes to [facility] for the past year.  There is no inpatient admissions noted. ? ?Past Medical History:  ?Past Medical History:  ?Diagnosis Date  ? Anemia   ? pt denies  ? Anxiety   ? Cataract   ? Depression   ? Diabetes mellitus without complication ([place])   ? type 2  ? Dyspnea   ? Hyperlipidemia   ? PONV (postoperative nausea and vomiting)   ?  ?Past Surgical History:  ?Procedure Laterality Date  ? BRONCHIAL BIOPSY  02/16/2021  ? Procedure: BRONCHIAL BIOPSIES;  Surgeon: Garner Nash, DO;  Location: [place] [facility];  Service: Pulmonary;;  ? BRONCHIAL BRUSHINGS  02/16/2021  ? Procedure: BRONCHIAL BRUSHINGS;  Surgeon: Garner Nash, DO;  Location: [place];  Service: Pulmonary;;   ? BRONCHIAL NEEDLE ASPIRATION BIOPSY  02/16/2021  ? Procedure: BRONCHIAL NEEDLE ASPIRATION BIOPSIES;  Surgeon: Garner Nash, DO;  Location: [place];  Service: Pulmonary;;  ? BRONCHIAL WASHINGS  02/16/2021  ? Procedure: BRONCHIAL WASHINGS;  Surgeon: Garner Nash, DO;  Location: [place] [facility];  Service: Pulmonary;;  ? CATARACT EXTRACTION    ? bilateral  ? CHOLECYSTECTOMY N/A 02/10/2017  ? Procedure: LAPAROSCOPIC CHOLECYSTECTOMY WITH INTRAOPERATIVE CHOLANGIOGRAM;  Surgeon: Alphonsa Overall, MD;  Location: [facility];  Service: General;  Laterality: N/A;  ? COLONOSCOPY    ? FIDUCIAL MARKER PLACEMENT  02/16/2021  ? Procedure: FIDUCIAL MARKER PLACEMENT;  Surgeon: Garner Nash, DO;  Location: [place] [facility];  Service: Pulmonary;;  ? VIDEO BRONCHOSCOPY WITH ENDOBRONCHIAL NAVIGATION Right 02/16/2021  ? Procedure: VIDEO BRONCHOSCOPY WITH ENDOBRONCHIAL NAVIGATION;  Surgeon: Garner Nash, DO;  Location: [place];  Service: Pulmonary;  Laterality: Right;  ? ?Family History:  ?Family History  ?Problem Relation Age of Onset  ? Liver cancer Mother   ? Depression Father   ? Drug abuse Father   ? ?Family Psychiatric  History: Father committed suicide ?Social History:  ?Social History  ? ?Substance and Sexual Activity  ?Alcohol Use No  ?   ?Social History  ? ?Substance and Sexual Activity  ?Drug Use No  ?  ?Social History  ? ?Socioeconomic History  ? Marital status: Married  ?  Spouse  name: Not on file  ? Number of children: Not on file  ? Years of education: Not on file  ? Highest education level: Not on file  ?Occupational History  ? Not on file  ?Tobacco Use  ? Smoking status: Every Day  ?  Packs/day: 1.00  ?  Years: 43.00  ?  Pack years: 43.00  ?  Types: Cigarettes  ?  Passive exposure: Current  ? Smokeless tobacco: Never  ? Tobacco comments:  ?  10-20 cigarettes smoked daily 02/17/2021  ?Vaping Use  ? Vaping Use: Never used  ?Substance and Sexual Activity  ? Alcohol use: No  ? Drug use: No  ? Sexual activity: Yes  ?   Partners: Female  ?  Birth control/protection: None  ?Other Topics Concern  ? Not on file  ?Social History Narrative  ? Patient is currently a smoker and states he is not ready to quit  ? ?Social Determinants of Health  ? ?Financial Resource Strain: Not on file  ?Food Insecurity: Not on file  ?Transportation Needs: Not on file  ?Physical Activity: Not on file  ?Stress: Not on file  ?Social Connections: Not on file  ? ?Additional Social History:  ?  ?  ?  ?  ?  ?  ?  ?  ?  ?  ?  ? ?Sleep: Fair ? ?Appetite:  Fair ? ?Current Medications: ?Current Facility-Administered Medications  ?Medication Dose Route Frequency Provider Last Rate Last Admin  ? acetaminophen (TYLENOL) tablet 650 mg  650 mg Oral Q6H PRN Patrecia Pour, NP      ? alum & mag hydroxide-simeth (MAALOX/MYLANTA) 200-200-20 MG/5ML suspension 30 mL  30 mL Oral Q4H PRN Patrecia Pour, NP      ? empagliflozin (JARDIANCE) tablet 10 mg  10 mg Oral Daily Patrecia Pour, NP   10 mg at 01/03/22 4332  ? FLUoxetine (PROZAC) capsule 20 mg  20 mg Oral Daily Parks Ranger, DO   20 mg at 01/03/22 1102  ? gabapentin (NEURONTIN) capsule 300 mg  300 mg Oral TID PRN Rulon Sera, MD      ? hydrOXYzine (ATARAX) tablet 50 mg  50 mg Oral Q6H PRN Deloria Lair, NP   50 mg at 01/03/22 0750  ? icosapent Ethyl (VASCEPA) 1 g capsule 1 g  1 g Oral BID Patrecia Pour, NP   1 g at 01/03/22 9518  ? insulin aspart (novoLOG) injection 0-15 Units  0-15 Units Subcutaneous TID WC Patrecia Pour, NP   2 Units at 01/01/22 8416  ? magnesium hydroxide (MILK OF MAGNESIA) suspension 30 mL  30 mL Oral Daily PRN Patrecia Pour, NP      ? mirtazapine (REMERON) tablet 15 mg  15 mg Oral QHS Rulon Sera, MD   15 mg at 01/02/22 2101  ? nicotine (NICODERM CQ - dosed in mg/24 hours) patch 14 mg  14 mg Transdermal Daily Parks Ranger, DO   14 mg at 01/03/22 6063  ? risperiDONE (RISPERDAL) tablet 0.5 mg  0.5 mg Oral BH-q8a4p Parks Ranger, DO   0.5 mg at 01/03/22 1102  ?  simvastatin (ZOCOR) tablet 20 mg  20 mg Oral QHS Patrecia Pour, NP   20 mg at 01/02/22 2059  ? tamsulosin (FLOMAX) capsule 0.4 mg  0.4 mg Oral Daily Patrecia Pour, NP   0.4 mg at 01/03/22 0160  ? traZODone (DESYREL) tablet 100 mg  100 mg Oral QHS PRN Rulon Sera,  MD   100 mg at 01/02/22 2059  ? ? ?Lab Results:  ?Results for orders placed or performed during the hospital encounter of 12/31/21 (from the past 48 hour(s))  ?Glucose, capillary     Status: Abnormal  ? Collection Time: 01/01/22  4:26 PM  ?Result Value Ref Range  ? Glucose-Capillary 107 (H) 70 - 99 mg/dL  ?  Comment: Glucose reference range applies only to samples taken after fasting for at least 8 hours.  ?Glucose, capillary     Status: Abnormal  ? Collection Time: 01/01/22  7:34 PM  ?Result Value Ref Range  ? Glucose-Capillary 115 (H) 70 - 99 mg/dL  ?  Comment: Glucose reference range applies only to samples taken after fasting for at least 8 hours.  ?Glucose, capillary     Status: Abnormal  ? Collection Time: 01/02/22  7:49 AM  ?Result Value Ref Range  ? Glucose-Capillary 114 (H) 70 - 99 mg/dL  ?  Comment: Glucose reference range applies only to samples taken after fasting for at least 8 hours.  ? Comment 1 Notify RN   ?Glucose, capillary     Status: Abnormal  ? Collection Time: 01/02/22 11:52 AM  ?Result Value Ref Range  ? Glucose-Capillary 108 (H) 70 - 99 mg/dL  ?  Comment: Glucose reference range applies only to samples taken after fasting for at least 8 hours.  ?Glucose, capillary     Status: None  ? Collection Time: 01/02/22  4:37 PM  ?Result Value Ref Range  ? Glucose-Capillary 89 70 - 99 mg/dL  ?  Comment: Glucose reference range applies only to samples taken after fasting for at least 8 hours.  ?Glucose, capillary     Status: Abnormal  ? Collection Time: 01/02/22  8:11 PM  ?Result Value Ref Range  ? Glucose-Capillary 113 (H) 70 - 99 mg/dL  ?  Comment: Glucose reference range applies only to samples taken after fasting for at least 8 hours.   ?Glucose, capillary     Status: Abnormal  ? Collection Time: 01/02/22 10:13 PM  ?Result Value Ref Range  ? Glucose-Capillary 135 (H) 70 - 99 mg/dL  ?  Comment: Glucose reference range applies only to samples

## 2022-01-03 NOTE — Progress Notes (Signed)
Recreation Therapy Notes ? ?INPATIENT RECREATION THERAPY ASSESSMENT ? ?Patient Details ?Name: Jeremy Shannon ?MRN: [id number] ?DOB: 11/26/1955 ?Today's Date: 01/03/2022 ?      ?Information Obtained From: ?Patient ? ?Able to Participate in Assessment/Interview: ?Yes ? ?Patient Presentation: ?Responsive ? ?Reason for Admission (Per Patient): ?Active Symptoms ? ?Patient Stressors: ?  ? ?Coping Skills:   ?Exercise, Read ? ?Leisure Interests (2+):  ?Exercise - Running, Exercise - Walking, Sports [employer], Individual - Reading ? ?Frequency of Recreation/Participation: ?Monthly ? ?Awareness of Community Resources:  ?Yes ? ?Community Resources:  ?[employer] ? ?Current Use: ?Yes ? ?If no, Barriers?: ?  ? ?Expressed Interest in [employer] Information: ?Yes ? ?[place] of Residence:  ?[place] ? ?Patient Main Form of Transportation: ?Car ? ?Patient Strengths:  ?cARING ? ?Patient Identified Areas of Improvement:  ?N/A ? ?Patient Goal for Hospitalization:  ?Get straight and get home ? ?Current SI (including self-harm):  ?No ? ?Current HI:  ?No ? ?Current AVH: ?No ? ?Staff Intervention Plan: ?Group Attendance, Collaborate with Interdisciplinary Treatment Team ? ?Consent to Intern Participation: ?N/A ? ?Rebeca Valdivia ?01/03/2022, 3:45 PM ?

## 2022-01-03 NOTE — Progress Notes (Signed)
Recreation Therapy Notes ? ?INPATIENT RECREATION TR PLAN ? ?Patient Details ?Name: Jeremy Shannon ?MRN: [id number] ?DOB: 1955/12/15 ?Today's Date: 01/03/2022 ? ?Rec Therapy Plan ?Is patient appropriate for Therapeutic Recreation?: Yes ?Treatment times per week: at least 3 ?Estimated Length of Stay: 5-7 days ?TR Treatment/Interventions: Group participation (Comment) ? ?Discharge Criteria ?Pt will be discharged from therapy if:: Discharged ?Treatment plan/goals/alternatives discussed and agreed upon by:: Patient/family ? ?Discharge Summary ?  ? ? ?Tereso Unangst ?01/03/2022, 3:47 PM ?

## 2022-01-03 NOTE — [facility] Group Notes (Signed)
[place] Group Notes:  (Nursing/MHT/Case Management/Adjunct) ? ?Date:  01/03/2022  ?Time:  10:00 AM ? ?Type of Therapy:  Psychoeducational Skills ? ?Participation Level:  Active ? ?Participation Quality:  Appropriate ? ?Affect:  Appropriate ? ?Cognitive:  Appropriate ? ?Insight:  Appropriate ? ?Engagement in Group:  Engaged ? ?Modes of Intervention:  Discussion ? ?Summary of Progress/Problems: ?The pt attended group and shared when asked during discussion. ?Jeremy Shannon ?01/03/2022, 11:02 AM ?

## 2022-01-03 NOTE — Progress Notes (Signed)
Recreation Therapy Notes ? ?Date: 01/03/2022 ? ?Time: 1:30 PM   ? ?Location: Craft room  ? ?Behavioral response: Appropriate ? ?Intervention Topic: Social Skills    ? ?Discussion/Intervention:  ?Group content on today was focused on social skills. The group defined social skills and identified ways they use social skills. Patients expressed what obstacles they face when trying to be social. Participants described the importance of social skills. The group listed ways to improve social skills and reasons to improve social skills. Individuals had an opportunity to learn new and improve social skills as well as identify their weaknesses. ?Clinical Observations/Feedback: ?Patient came to group and defined social skills as talking to others. Individual was social with peers and staff while participating in the intervention.    ?Natisha Trzcinski LRT/CTRS  ? ? ? ? ? ? ? ?Jenel Gierke ?01/03/2022 3:33 PM ?

## 2022-01-03 NOTE — BH IP Treatment Plan (Signed)
Interdisciplinary Treatment and Diagnostic Plan Update ? ?01/03/2022 ?Time of Session: 10:00AM ?Jeremy Shannon ?MRN: [id number] ? ?Principal Diagnosis: Major depressive disorder, recurrent severe without psychotic features ([place]) ? ?Secondary Diagnoses: Principal Problem: ?  Major depressive disorder, recurrent severe without psychotic features ([place]) ? ? ?Current Medications:  ?Current Facility-Administered Medications  ?Medication Dose Route Frequency Provider Last Rate Last Admin  ? acetaminophen (TYLENOL) tablet 650 mg  650 mg Oral Q6H PRN Patrecia Pour, NP      ? alum & mag hydroxide-simeth (MAALOX/MYLANTA) 200-200-20 MG/5ML suspension 30 mL  30 mL Oral Q4H PRN Patrecia Pour, NP      ? empagliflozin (JARDIANCE) tablet 10 mg  10 mg Oral Daily Patrecia Pour, NP   10 mg at 01/03/22 9150  ? FLUoxetine (PROZAC) capsule 20 mg  20 mg Oral Daily Parks Ranger, DO   20 mg at 01/03/22 1102  ? gabapentin (NEURONTIN) capsule 300 mg  300 mg Oral TID PRN Rulon Sera, MD      ? hydrOXYzine (ATARAX) tablet 50 mg  50 mg Oral Q6H PRN Deloria Lair, NP   50 mg at 01/03/22 0750  ? icosapent Ethyl (VASCEPA) 1 g capsule 1 g  1 g Oral BID Patrecia Pour, NP   1 g at 01/03/22 5697  ? insulin aspart (novoLOG) injection 0-15 Units  0-15 Units Subcutaneous TID WC Patrecia Pour, NP   2 Units at 01/01/22 9480  ? magnesium hydroxide (MILK OF MAGNESIA) suspension 30 mL  30 mL Oral Daily PRN Patrecia Pour, NP      ? mirtazapine (REMERON) tablet 15 mg  15 mg Oral QHS Rulon Sera, MD   15 mg at 01/02/22 2101  ? nicotine (NICODERM CQ - dosed in mg/24 hours) patch 14 mg  14 mg Transdermal Daily Parks Ranger, DO   14 mg at 01/03/22 1655  ? risperiDONE (RISPERDAL) tablet 0.5 mg  0.5 mg Oral BH-q8a4p Parks Ranger, DO   0.5 mg at 01/03/22 1102  ? simvastatin (ZOCOR) tablet 20 mg  20 mg Oral QHS Patrecia Pour, NP   20 mg at 01/02/22 2059  ? tamsulosin (FLOMAX) capsule 0.4 mg  0.4 mg Oral Daily Patrecia Pour, NP   0.4 mg at 01/03/22 3748  ? traZODone (DESYREL) tablet 100 mg  100 mg Oral QHS PRN Rulon Sera, MD   100 mg at 01/02/22 2059  ? ?PTA Medications: ?Medications Prior to Admission  ?Medication Sig Dispense Refill Last Dose  ? empagliflozin (JARDIANCE) 10 MG TABS tablet Take 10 mg by mouth daily.     ? icosapent Ethyl (VASCEPA) 1 g capsule Take 2 capsules (2 g total) by mouth 2 (two) times daily. (Patient taking differently: Take 1 g by mouth 2 (two) times daily.) 360 capsule 1   ? simvastatin (ZOCOR) 20 MG tablet Take 1 tablet (20 mg total) by mouth at bedtime. 90 tablet 1   ? tamsulosin (FLOMAX) 0.4 MG CAPS capsule Take 1 capsule (0.4 mg total) by mouth daily. 30 capsule 0   ? ? ?Patient Stressors: Financial difficulties   ? ?Patient Strengths: Supportive family/friends  ? ?Treatment Modalities: Medication Management, Group therapy, Case management,  ?1 to 1 session with clinician, Psychoeducation, Recreational therapy. ? ? ?Physician Treatment Plan for Primary Diagnosis: Major depressive disorder, recurrent severe without psychotic features ([place]) ?Long Term Goal(s): Improvement in symptoms so as ready for discharge  ? ?Short Term Goals: Ability to demonstrate self-control  will improve ?Ability to identify changes in lifestyle to reduce recurrence of condition will improve ?Ability to verbalize feelings will improve ? ?Medication Management: Evaluate patient's response, side effects, and tolerance of medication regimen. ? ?Therapeutic Interventions: 1 to 1 sessions, Unit Group sessions and Medication administration. ? ?Evaluation of Outcomes: Not Met ? ?Physician Treatment Plan for Secondary Diagnosis: Principal Problem: ?  Major depressive disorder, recurrent severe without psychotic features ([place]) ? ?Long Term Goal(s): Improvement in symptoms so as ready for discharge  ? ?Short Term Goals: Ability to demonstrate self-control will improve ?Ability to identify changes in lifestyle to reduce recurrence  of condition will improve ?Ability to verbalize feelings will improve    ? ?Medication Management: Evaluate patient's response, side effects, and tolerance of medication regimen. ? ?Therapeutic Interventions: 1 to 1 sessions, Unit Group sessions and Medication administration. ? ?Evaluation of Outcomes: Not Met ? ? ?RN Treatment Plan for Primary Diagnosis: Major depressive disorder, recurrent severe without psychotic features ([place]) ?Long Term Goal(s): Knowledge of disease and therapeutic regimen to maintain health will improve ? ?Short Term Goals: Ability to remain free from injury will improve, Ability to verbalize frustration and anger appropriately will improve, Ability to demonstrate self-control, Ability to participate in decision making will improve, Ability to verbalize feelings will improve, Ability to disclose and discuss suicidal ideas, Ability to identify and develop effective coping behaviors will improve, and Compliance with prescribed medications will improve ? ?Medication Management: RN will administer medications as ordered by provider, will assess and evaluate patient's response and provide education to patient for prescribed medication. RN will report any adverse and/or side effects to prescribing provider. ? ?Therapeutic Interventions: 1 on 1 counseling sessions, Psychoeducation, Medication administration, Evaluate responses to treatment, Monitor vital signs and CBGs as ordered, Perform/monitor CIWA, COWS, AIMS and Fall Risk screenings as ordered, Perform wound care treatments as ordered. ? ?Evaluation of Outcomes: Not Met ? ? ?LCSW Treatment Plan for Primary Diagnosis: Major depressive disorder, recurrent severe without psychotic features ([place]) ?Long Term Goal(s): Safe transition to appropriate next level of care at discharge, Engage patient in therapeutic group addressing interpersonal concerns. ? ?Short Term Goals: Engage patient in aftercare planning with referrals and resources, Increase  social support, Increase ability to appropriately verbalize feelings, Increase emotional regulation, Facilitate acceptance of mental health diagnosis and concerns, Identify triggers associated with mental health/substance abuse issues, and Increase skills for wellness and recovery ? ?Therapeutic Interventions: Assess for all discharge needs, 1 to 1 time with Education officer, museum, Explore available resources and support systems, Assess for adequacy in community support network, Educate family and significant other(s) on suicide prevention, Complete Psychosocial Assessment, Interpersonal group therapy. ? ?Evaluation of Outcomes: Not Met ? ? ?Progress in Treatment: ?Attending groups: Yes. ?Participating in groups: Yes. ?Taking medication as prescribed: Yes. ?Toleration medication: Yes. ?Family/Significant other contact made: Yes, individual(s) contacted:  SPE completed with [name], pt's spouse ?Patient understands diagnosis: Yes. ?Discussing patient identified problems/goals with staff: Yes. ?Medical problems stabilized or resolved: Yes. ?Denies suicidal/homicidal ideation: Yes. ?Issues/concerns per patient self-inventory: No. ?Other: None ? ?New problem(s) identified: No, Describe:  None ? ?New Short Term/Long Term Goal(s): Patient to work towards medication management for mood stabilization; elimination of SI thoughts; development of comprehensive mental wellness plan.  ? ?Patient Goals:  "to just be happy, to be with my family, friends" ? ?Discharge Plan or Barriers: CSW will assist pt with development of appropriate discharge/aftercare plan. Patient's partner stated concern regarding pt returning home. ? ?Reason for Continuation of Hospitalization:  Depression ?Medication stabilization ?Suicidal ideation ? ?Estimated Length of Stay: ? ?Last 3 [place] Suicide Severity Risk Score: ?[place] Admission (Current) from 12/31/2021 in [place] [facility] to Hosp-Admission (Discharged) from 12/29/2021  in [place] [facility] from 09/23/2021 in [facility]  ?C-SSRS RISK CATEGORY No Risk High Risk No Risk  ? ?  ? ? ?Last PHQ 2/9 Scores: ? ?  12/03/2020

## 2022-01-03 NOTE — Plan of Care (Signed)
Patient remains AOX4, calm and compliant during assessment. Denies pain or discomfort. Endorsed anxiety of 6/10, denies depression. Denies SI, HI, AVH. Ate meals in the day room among peers and tolerated well. Compliant with due medications. Reports not being able to sleep last night. Restless, pacing down the hallway. Remain safe on the unit with Q15 minutes safety checks. ? ? ? ?Problem: Education: ?Goal: Knowledge of [facility] Education information/materials will improve ?Outcome: Progressing ?Goal: Emotional status will improve ?Outcome: Progressing ?Goal: Mental status will improve ?Outcome: Progressing ?Goal: Verbalization of understanding the information provided will improve ?Outcome: Progressing ?  ?Problem: Coping: ?Goal: Ability to verbalize frustrations and anger appropriately will improve ?Outcome: Progressing ?  ?Problem: Health Behavior/Discharge Planning: ?Goal: Identification of resources available to assist in meeting health care needs will improve ?Outcome: Progressing ?  ?

## 2022-01-03 NOTE — Progress Notes (Signed)
Patient denies SI, HI & AVH.  He was cooperative with treatment. No new behavioral issues to report on the shift thus far. He appears to be in bed resting quietly. ?

## 2022-01-04 DIAGNOSIS — F332 Major depressive disorder, recurrent severe without psychotic features: Secondary | ICD-10-CM | POA: Diagnosis not present

## 2022-01-04 LAB — GLUCOSE, CAPILLARY
Glucose-Capillary: 107 mg/dL — ABNORMAL HIGH (ref 70–99)
Glucose-Capillary: 105 mg/dL — ABNORMAL HIGH (ref 70–99)
Glucose-Capillary: 134 mg/dL — ABNORMAL HIGH (ref 70–99)
Glucose-Capillary: 104 mg/dL — ABNORMAL HIGH (ref 70–99)

## 2022-01-04 MED ORDER — GABAPENTIN 300 MG PO CAPS
300.0000 mg | ORAL_CAPSULE | Freq: Three times a day (TID) | ORAL | Status: DC
  Administered 2022-01-04 – 2022-01-05 (×4): 300 mg via ORAL
  Filled 2022-01-04 (×4): qty 1

## 2022-01-04 MED ORDER — DOXEPIN HCL 100 MG PO CAPS
100.0000 mg | ORAL_CAPSULE | Freq: Every day | ORAL | Status: DC
  Administered 2022-01-04: 100 mg via ORAL
  Filled 2022-01-04: qty 1

## 2022-01-04 MED ORDER — DOXEPIN HCL 50 MG PO CAPS: 50.0000 mg | ORAL_CAPSULE | Freq: Every day | ORAL | Status: DC

## 2022-01-04 MED ORDER — TRAZODONE HCL 50 MG PO TABS
150.0000 mg | ORAL_TABLET | Freq: Every evening | ORAL | Status: DC | PRN
  Administered 2022-01-04: 150 mg via ORAL
  Filled 2022-01-04: qty 1

## 2022-01-04 NOTE — Progress Notes (Signed)
Jeremy Shannon ? ?01/04/2022 2:20 PM ?Jeremy Shannon  ?MRN:  [id number] ?Subjective: Jeremy Shannon is seen on rounds today.  He has been up the last 2 nights at 2:00 in the morning.  I am going to change his medications from Remeron to doxepin and increase his trazodone.  He was thinking of killing himself.  He states that his wife has thrown the gun away.  He spoke with his family and they wanted him to stay.  He says he is okay with that but he feels like he is locked up.  I reassured him that this is the best way to work on his medications and get his anxiety under control.  We scheduled his Neurontin to 300 mg 3 times a day. ? ?Principal Problem: Major depressive disorder, recurrent severe without psychotic features ([place]) ?Diagnosis: Principal Problem: ?  Major depressive disorder, recurrent severe without psychotic features ([place]) ? ?Total Time spent with patient: 15 minutes ? 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  He has been on Rexulti.  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  States he goes to [facility] for the past year.  There is no inpatient admissions noted. ? ?Past Medical History:  ?Past Medical History:  ?Diagnosis Date  ? Anemia   ? pt denies  ? Anxiety   ? Cataract   ? Depression   ? Diabetes mellitus without complication ([place])   ? type 2  ? Dyspnea   ? Hyperlipidemia   ? PONV (postoperative nausea and vomiting)   ?  ?Past Surgical History:  ?Procedure Laterality Date  ? BRONCHIAL BIOPSY  02/16/2021  ? Procedure: BRONCHIAL BIOPSIES;  Surgeon: Garner Nash, DO;  Location: [place] [facility];  Service: Pulmonary;;  ? BRONCHIAL BRUSHINGS  02/16/2021  ? Procedure: BRONCHIAL BRUSHINGS;  Surgeon: Garner Nash, DO;  Location: [place];  Service: Pulmonary;;  ? BRONCHIAL NEEDLE ASPIRATION BIOPSY  02/16/2021  ? Procedure: BRONCHIAL NEEDLE ASPIRATION BIOPSIES;  Surgeon: Garner Nash, DO;   Location: [place];  Service: Pulmonary;;  ? BRONCHIAL WASHINGS  02/16/2021  ? Procedure: BRONCHIAL WASHINGS;  Surgeon: Garner Nash, DO;  Location: [place] [facility];  Service: Pulmonary;;  ? CATARACT EXTRACTION    ? bilateral  ? CHOLECYSTECTOMY N/A 02/10/2017  ? Procedure: LAPAROSCOPIC CHOLECYSTECTOMY WITH INTRAOPERATIVE CHOLANGIOGRAM;  Surgeon: Alphonsa Overall, MD;  Location: [facility];  Service: General;  Laterality: N/A;  ? COLONOSCOPY    ? FIDUCIAL MARKER PLACEMENT  02/16/2021  ? Procedure: FIDUCIAL MARKER PLACEMENT;  Surgeon: Garner Nash, DO;  Location: [facility];  Service: Pulmonary;;  ? VIDEO BRONCHOSCOPY WITH ENDOBRONCHIAL NAVIGATION Right 02/16/2021  ? Procedure: VIDEO BRONCHOSCOPY WITH ENDOBRONCHIAL NAVIGATION;  Surgeon: Garner Nash, DO;  Location: [facility];  Service: Pulmonary;  Laterality: Right;  ? ?Family History:  ?Family History  ?Problem Relation Age of Onset  ? Liver cancer Mother   ? Depression Father   ? Drug abuse Father   ? ?Family Psychiatric  History: Unknown ?Social History:  ?Social History  ? ?Substance and Sexual Activity  ?Alcohol Use No  ?   ?Social History  ? ?Substance and Sexual Activity  ?Drug Use No  ?  ?Social History  ? ?Socioeconomic History  ? Marital status: Married  ?  Spouse name: Not on file  ? Number of children: Not on file  ? Years of education: Not on file  ? Highest education level:  Not on file  ?Occupational History  ? Not on file  ?Tobacco Use  ? Smoking status: Every Day  ?  Packs/day: 1.00  ?  Years: 43.00  ?  Pack years: 43.00  ?  Types: Cigarettes  ?  Passive exposure: Current  ? Smokeless tobacco: Never  ? Tobacco comments:  ?  10-20 cigarettes smoked daily 02/17/2021  ?Vaping Use  ? Vaping Use: Never used  ?Substance and Sexual Activity  ? Alcohol use: No  ? Drug use: No  ? Sexual activity: Yes  ?  Partners: Female  ?  Birth control/protection: None  ?Other Topics Concern  ? Not on file  ?Social History Narrative  ? Patient is currently a smoker and  states he is not ready to quit  ? ?Social Determinants of Health  ? ?Financial Resource Strain: Not on file  ?Food Insecurity: Not on file  ?Transportation Needs: Not on file  ?Physical Activity: Not on file  ?Stress: Not on file  ?Social Connections: Not on file  ? ?Additional Social History:  ?  ?  ?  ?  ?  ?  ?  ?  ?  ?  ?  ? ?Sleep: Poor ? ?Appetite:  Fair ? ?Current Medications: ?Current Facility-Administered Medications  ?Medication Dose Route Frequency Provider Last Rate Last Admin  ? acetaminophen (TYLENOL) tablet 650 mg  650 mg Oral Q6H PRN Patrecia Pour, NP      ? alum & mag hydroxide-simeth (MAALOX/MYLANTA) 200-200-20 MG/5ML suspension 30 mL  30 mL Oral Q4H PRN Patrecia Pour, NP      ? doxepin (SINEQUAN) capsule 100 mg  100 mg Oral QHS Parks Ranger, DO      ? empagliflozin (JARDIANCE) tablet 10 mg  10 mg Oral Daily Patrecia Pour, NP   10 mg at 01/04/22 1017  ? FLUoxetine (PROZAC) capsule 20 mg  20 mg Oral Daily Parks Ranger, DO   20 mg at 01/04/22 1018  ? gabapentin (NEURONTIN) capsule 300 mg  300 mg Oral TID PC Parks Ranger, DO   300 mg at 01/04/22 1309  ? hydrOXYzine (ATARAX) tablet 50 mg  50 mg Oral Q6H PRN Deloria Lair, NP   50 mg at 01/04/22 0912  ? icosapent Ethyl (VASCEPA) 1 g capsule 1 g  1 g Oral BID Patrecia Pour, NP   1 g at 01/04/22 1017  ? insulin aspart (novoLOG) injection 0-15 Units  0-15 Units Subcutaneous TID WC Patrecia Pour, NP   2 Units at 01/01/22 6295  ? magnesium hydroxide (MILK OF MAGNESIA) suspension 30 mL  30 mL Oral Daily PRN Patrecia Pour, NP      ? nicotine (NICODERM CQ - dosed in mg/24 hours) patch 14 mg  14 mg Transdermal Daily Parks Ranger, DO   14 mg at 01/04/22 2841  ? risperiDONE (RISPERDAL) tablet 0.5 mg  0.5 mg Oral BH-[id number] Parks Ranger, DO   0.5 mg at 01/04/22 3244  ? simvastatin (ZOCOR) tablet 20 mg  20 mg Oral QHS Patrecia Pour, NP   20 mg at 01/03/22 2107  ? tamsulosin (FLOMAX) capsule 0.4 mg   0.4 mg Oral Daily Patrecia Pour, NP   0.4 mg at 01/04/22 1018  ? traZODone (DESYREL) tablet 150 mg  150 mg Oral QHS PRN Parks Ranger, DO      ? ? ?Lab Results:  ?Results for orders placed or performed during the hospital encounter of  12/31/21 (from the past 48 hour(s))  ?Glucose, capillary     Status: None  ? Collection Time: 01/02/22  4:37 PM  ?Result Value Ref Range  ? Glucose-Capillary 89 70 - 99 mg/dL  ?  Comment: Glucose reference range applies only to samples taken after fasting for at least 8 hours.  ?Glucose, capillary     Status: Abnormal  ? Collection Time: 01/02/22  8:11 PM  ?Result Value Ref Range  ? Glucose-Capillary 113 (H) 70 - 99 mg/dL  ?  Comment: Glucose reference range applies only to samples taken after fasting for at least 8 hours.  ?Glucose, capillary     Status: Abnormal  ? Collection Time: 01/02/22 10:13 PM  ?Result Value Ref Range  ? Glucose-Capillary 135 (H) 70 - 99 mg/dL  ?  Comment: Glucose reference range applies only to samples taken after fasting for at least 8 hours.  ?Glucose, capillary     Status: Abnormal  ? Collection Time: 01/03/22  7:42 AM  ?Result Value Ref Range  ? Glucose-Capillary 102 (H) 70 - 99 mg/dL  ?  Comment: Glucose reference range applies only to samples taken after fasting for at least 8 hours.  ?Glucose, capillary     Status: Abnormal  ? Collection Time: 01/03/22 11:50 AM  ?Result Value Ref Range  ? Glucose-Capillary 106 (H) 70 - 99 mg/dL  ?  Comment: Glucose reference range applies only to samples taken after fasting for at least 8 hours.  ?Glucose, capillary     Status: None  ? Collection Time: 01/03/22  4:20 PM  ?Result Value Ref Range  ? Glucose-Capillary 99 70 - 99 mg/dL  ?  Comment: Glucose reference range applies only to samples taken after fasting for at least 8 hours.  ?Glucose, capillary     Status: Abnormal  ? Collection Time: 01/03/22  8:02 PM  ?Result Value Ref Range  ? Glucose-Capillary 111 (H) 70 - 99 mg/dL  ?  Comment: Glucose  reference range applies only to samples taken after fasting for at least 8 hours.  ?Glucose, capillary     Status: Abnormal  ? Collection Time: 01/04/22  8:26 AM  ?Result Value Ref Range  ? Glucose-Capillary 107 (H) 70

## 2022-01-04 NOTE — Progress Notes (Signed)
Pt observed in bed on initial contact. Presents with flat affect, fair eye contact and logical speech. Rates his anxiety 7/10 and depression 0/10 with stressor of "just being in here is driving me craazy". Pt denies SI, HI, AVH and pain when assessed. Received PRN Vistaril 50 mg PO at 0912 and reported relief when reassessed at 1010. However, pt noted to be preoccupied with Klonopin, made multiple request on interactions for it "I don't like the Vistaril because it does not really work like the [employer]". Isolative to room at intervals during shift except for meals and groups. Reports poor sleep last night "I was up at 0240 this morning when that lady started yelling. I get up at 0300 any ways since I've been here".  Emotional support and encouragement provided to pt. Safety checks and falls precaution maintained without incident. Verbal education provided on all medications and effects monitored. Pt tolerates all medications, meals and fluids well without discomfort. Pt remains safe in milieu.  ?

## 2022-01-04 NOTE — Plan of Care (Signed)
Patient presents Anxious.  Pt denies SI/HI/Avh and contracts for safety. Pt did verbalized anxiety is 6/10.  Pt states he is concerned about embarrassing his family and he needs to get home.  Pt did verbalize a good appetite "I ate Lunch and Dinner I don't usually eat breakfast".  Pt states "I learned to be self aware and treat others better" :this will give me life satisfaction".  Pt bs stable no insulin coverage need during the day.  Encouragement and support offered.  Q 15 minute safety checks in place.  Will continue plan of care.   ? ? ?Problem: Education: ?Goal: Knowledge of [facility] Education information/materials will improve ?Outcome: Progressing ?Goal: Emotional status will improve ?Outcome: Progressing ?Goal: Mental status will improve ?Outcome: Progressing ?Goal: Verbalization of understanding the information provided will improve ?Outcome: Progressing ?      ?

## 2022-01-04 NOTE — [facility] Group Notes (Signed)
[facility] Group Notes:  (Nursing/MHT/Case Management/Adjunct) ? ?Date:  01/04/2022  ?Time:  10:00 AM ? ?Type of Therapy:  Psychoeducational Skills ? ?Participation Level:  Active ? ?Participation Quality:  Appropriate ? ?Affect:  Appropriate ? ?Cognitive:  Appropriate ? ?Insight:  Appropriate ? ?Engagement in Group:  Engaged ? ?Modes of Intervention:  Discussion ? ?Summary of Progress/Problems: ?The pt attended group and was very attentive. The pt participated in group discussion. ?Juliette Alcide ?01/04/2022, 12:18 PM ?

## 2022-01-05 DIAGNOSIS — F332 Major depressive disorder, recurrent severe without psychotic features: Secondary | ICD-10-CM | POA: Diagnosis not present

## 2022-01-05 LAB — GLUCOSE, CAPILLARY
Glucose-Capillary: 120 mg/dL — ABNORMAL HIGH (ref 70–99)
Glucose-Capillary: 99 mg/dL (ref 70–99)
Glucose-Capillary: 132 mg/dL — ABNORMAL HIGH (ref 70–99)

## 2022-01-05 MED ORDER — TRAZODONE HCL 50 MG PO TABS
50.0000 mg | ORAL_TABLET | ORAL | Status: DC | PRN
  Administered 2022-01-05 – 2022-01-10 (×14): 50 mg via ORAL
  Filled 2022-01-05 (×15): qty 1

## 2022-01-05 MED ORDER — RISPERIDONE 1 MG PO TABS
1.0000 mg | ORAL_TABLET | ORAL | Status: DC
  Administered 2022-01-05 – 2022-01-06 (×2): 1 mg via ORAL
  Filled 2022-01-05 (×2): qty 1

## 2022-01-05 MED ORDER — GABAPENTIN 300 MG PO CAPS
600.0000 mg | ORAL_CAPSULE | Freq: Three times a day (TID) | ORAL | Status: DC
Start: 2022-01-05 — End: 2022-01-06
  Administered 2022-01-05 – 2022-01-06 (×2): 600 mg via ORAL
  Filled 2022-01-05 (×2): qty 2

## 2022-01-05 MED ORDER — HYDROXYZINE HCL 25 MG PO TABS
100.0000 mg | ORAL_TABLET | Freq: Every evening | ORAL | Status: DC | PRN
  Administered 2022-01-05 – 2022-01-06 (×2): 100 mg via ORAL
  Filled 2022-01-05 (×3): qty 4

## 2022-01-05 MED ORDER — DOXEPIN HCL 50 MG PO CAPS
150.0000 mg | ORAL_CAPSULE | Freq: Every day | ORAL | Status: DC
  Administered 2022-01-05 – 2022-01-11 (×7): 150 mg via ORAL
  Filled 2022-01-05 (×7): qty 1

## 2022-01-05 NOTE — Progress Notes (Signed)
Patient is calm and cooperative with assessment. He denies SI, HI, and AVH. Patient rated anxiety as a 6/10 and denied depression. Patient states that gabapentin helps with his anxiety. Patient is active on the unit and is seen walking around the hallways and sitting in the dayroom. Patient is compliant with scheduled medications. Patient remains safe on the unit at this time. ?

## 2022-01-05 NOTE — Progress Notes (Signed)
[facility] MD Progress Note ? ?01/05/2022 12:24 PM ?Jeremy Shannon  ?MRN:  [id number] ?Subjective: Jeremy Shannon was seen on rounds today.  He is still having a lot of anxiety and difficulty sleeping.  He seems to have a high tolerance for medications.  Last night he took 100 mg of doxepin and 150 mg of trazodone and 50 mg of Atarax.  He states he slept about 4 hours.  He states he was up at 3 AM and took the Atarax and went back to sleep.  I talked to him about rearranging his medications.  His anxiety got worse when his Xanax was discontinued about a year ago.  He has a hard time controlling his anxiety.  Plus, he does not sleep very well.  He denies any suicidal ideation and regrets the suicide attempt. ? ?Principal Problem: Major depressive disorder, recurrent severe without psychotic features ([place]) ?Diagnosis: Principal Problem: ?  Major depressive disorder, recurrent severe without psychotic features ([place]) ? ?Total Time spent with patient: 15 minutes ? 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  He has been on Rexulti.  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  States he goes to [facility] for the past year.  There is no inpatient admissions noted. ? ?Past Medical History:  ?Past Medical History:  ?Diagnosis Date  ? Anemia   ? pt denies  ? Anxiety   ? Cataract   ? Depression   ? Diabetes mellitus without complication ([place])   ? type 2  ? Dyspnea   ? Hyperlipidemia   ? PONV (postoperative nausea and vomiting)   ?  ?Past Surgical History:  ?Procedure Laterality Date  ? BRONCHIAL BIOPSY  02/16/2021  ? Procedure: BRONCHIAL BIOPSIES;  Surgeon: Garner Nash, DO;  Location: [place] [facility];  Service: Pulmonary;;  ? BRONCHIAL BRUSHINGS  02/16/2021  ? Procedure: BRONCHIAL BRUSHINGS;  Surgeon: Garner Nash, DO;  Location: [place];  Service: Pulmonary;;  ? BRONCHIAL NEEDLE ASPIRATION BIOPSY  02/16/2021  ? Procedure: BRONCHIAL  NEEDLE ASPIRATION BIOPSIES;  Surgeon: Garner Nash, DO;  Location: [place];  Service: Pulmonary;;  ? BRONCHIAL WASHINGS  02/16/2021  ? Procedure: BRONCHIAL WASHINGS;  Surgeon: Garner Nash, DO;  Location: [facility];  Service: Pulmonary;;  ? CATARACT EXTRACTION    ? bilateral  ? CHOLECYSTECTOMY N/A 02/10/2017  ? Procedure: LAPAROSCOPIC CHOLECYSTECTOMY WITH INTRAOPERATIVE CHOLANGIOGRAM;  Surgeon: Alphonsa Overall, MD;  Location: [facility];  Service: General;  Laterality: N/A;  ? COLONOSCOPY    ? FIDUCIAL MARKER PLACEMENT  02/16/2021  ? Procedure: FIDUCIAL MARKER PLACEMENT;  Surgeon: Garner Nash, DO;  Location: [facility];  Service: Pulmonary;;  ? VIDEO BRONCHOSCOPY WITH ENDOBRONCHIAL NAVIGATION Right 02/16/2021  ? Procedure: VIDEO BRONCHOSCOPY WITH ENDOBRONCHIAL NAVIGATION;  Surgeon: Garner Nash, DO;  Location: [facility];  Service: Pulmonary;  Laterality: Right;  ? ?Family History:  ?Family History  ?Problem Relation Age of Onset  ? Liver cancer Mother   ? Depression Father   ? Drug abuse Father   ? ? ?Social History:  ?Social History  ? ?Substance and Sexual Activity  ?Alcohol Use No  ?   ?Social History  ? ?Substance and Sexual Activity  ?Drug Use No  ?  ?Social History  ? ?Socioeconomic History  ? Marital status: Married  ?  Spouse name: Not on file  ? Number of children: Not on file  ? Years of education: Not on  file  ? Highest education level: Not on file  ?Occupational History  ? Not on file  ?Tobacco Use  ? Smoking status: Every Day  ?  Packs/day: 1.00  ?  Years: 43.00  ?  Pack years: 43.00  ?  Types: Cigarettes  ?  Passive exposure: Current  ? Smokeless tobacco: Never  ? Tobacco comments:  ?  10-20 cigarettes smoked daily 02/17/2021  ?Vaping Use  ? Vaping Use: Never used  ?Substance and Sexual Activity  ? Alcohol use: No  ? Drug use: No  ? Sexual activity: Yes  ?  Partners: Female  ?  Birth control/protection: None  ?Other Topics Concern  ? Not on file  ?Social History Narrative  ? Patient is  currently a smoker and states he is not ready to quit  ? ?Social Determinants of Health  ? ?Financial Resource Strain: Not on file  ?Food Insecurity: Not on file  ?Transportation Needs: Not on file  ?Physical Activity: Not on file  ?Stress: Not on file  ?Social Connections: Not on file  ? ?Additional Social History:  ?  ?  ?  ?  ?  ?  ?  ?  ?  ?  ?  ? ?Sleep: Poor ? ?Appetite:  Good ? ?Current Medications: ?Current Facility-Administered Medications  ?Medication Dose Route Frequency Provider Last Rate Last Admin  ? acetaminophen (TYLENOL) tablet 650 mg  650 mg Oral Q6H PRN Patrecia Pour, NP      ? alum & mag hydroxide-simeth (MAALOX/MYLANTA) 200-200-20 MG/5ML suspension 30 mL  30 mL Oral Q4H PRN Patrecia Pour, NP      ? doxepin (SINEQUAN) capsule 150 mg  150 mg Oral QHS Parks Ranger, DO      ? empagliflozin (JARDIANCE) tablet 10 mg  10 mg Oral Daily Patrecia Pour, NP   10 mg at 01/05/22 5188  ? FLUoxetine (PROZAC) capsule 20 mg  20 mg Oral Daily Parks Ranger, DO   20 mg at 01/05/22 4166  ? gabapentin (NEURONTIN) capsule 600 mg  600 mg Oral TID PC Jatziri Goffredo Percell [place], DO      ? hydrOXYzine (ATARAX) tablet 100 mg  100 mg Oral QHS PRN Parks Ranger, DO      ? icosapent Ethyl (VASCEPA) 1 g capsule 1 g  1 g Oral BID Patrecia Pour, NP   1 g at 01/05/22 0908  ? insulin aspart (novoLOG) injection 0-15 Units  0-15 Units Subcutaneous TID WC Patrecia Pour, NP   2 Units at 01/04/22 1802  ? magnesium hydroxide (MILK OF MAGNESIA) suspension 30 mL  30 mL Oral Daily PRN Patrecia Pour, NP      ? nicotine (NICODERM CQ - dosed in mg/24 hours) patch 14 mg  14 mg Transdermal Daily Parks Ranger, DO   14 mg at 01/05/22 0630  ? risperiDONE (RISPERDAL) tablet 1 mg  1 mg Oral BH-[id number] Parks Ranger, DO      ? simvastatin (ZOCOR) tablet 20 mg  20 mg Oral QHS Patrecia Pour, NP   20 mg at 01/04/22 2107  ? tamsulosin (FLOMAX) capsule 0.4 mg  0.4 mg Oral Daily Patrecia Pour,  NP   0.4 mg at 01/05/22 1601  ? traZODone (DESYREL) tablet 50 mg  50 mg Oral Q4H PRN Parks Ranger, DO      ? ? ?Lab Results:  ?Results for orders placed or performed during the hospital encounter of 12/31/21 (from the  past 48 hour(s))  ?Glucose, capillary     Status: None  ? Collection Time: 01/03/22  4:20 PM  ?Result Value Ref Range  ? Glucose-Capillary 99 70 - 99 mg/dL  ?  Comment: Glucose reference range applies only to samples taken after fasting for at least 8 hours.  ?Glucose, capillary     Status: Abnormal  ? Collection Time: 01/03/22  8:02 PM  ?Result Value Ref Range  ? Glucose-Capillary 111 (H) 70 - 99 mg/dL  ?  Comment: Glucose reference range applies only to samples taken after fasting for at least 8 hours.  ?Glucose, capillary     Status: Abnormal  ? Collection Time: 01/04/22  8:26 AM  ?Result Value Ref Range  ? Glucose-Capillary 107 (H) 70 - 99 mg/dL  ?  Comment: Glucose reference range applies only to samples taken after fasting for at least 8 hours.  ?Glucose, capillary     Status: Abnormal  ? Collection Time: 01/04/22 11:51 AM  ?Result Value Ref Range  ? Glucose-Capillary 105 (H) 70 - 99 mg/dL  ?  Comment: Glucose reference range applies only to samples taken after fasting for at least 8 hours.  ?Glucose, capillary     Status: Abnormal  ? Collection Time: 01/04/22  4:30 PM  ?Result Value Ref Range  ? Glucose-Capillary 134 (H) 70 - 99 mg/dL  ?  Comment: Glucose reference range applies only to samples taken after fasting for at least 8 hours.  ?Glucose, capillary     Status: Abnormal  ? Collection Time: 01/04/22  8:00 PM  ?Result Value Ref Range  ? Glucose-Capillary 104 (H) 70 - 99 mg/dL  ?  Comment: Glucose reference range applies only to samples taken after fasting for at least 8 hours.  ?Glucose, capillary     Status: Abnormal  ? Collection Time: 01/05/22  8:03 AM  ?Result Value Ref Range  ? Glucose-Capillary 120 (H) 70 - 99 mg/dL  ?  Comment: Glucose reference range applies only to  samples taken after fasting for at least 8 hours.  ?Glucose, capillary     Status: None  ? Collection Time: 01/05/22 11:39 AM  ?Result Value Ref Range  ? Glucose-Capillary 99 70 - 99 mg/dL  ?  Comment: Glucos

## 2022-01-05 NOTE — Plan of Care (Signed)
Patient presents Calm and Cooperative during shift.  Pt denies SI/HI/Avh and contracts for safety.  Patient did verbalize Anxiety. Pt given atarax twice during shift to manage symptoms. Pt c/o interrupted sleep and continues to request an extra dose of trazodone and gabapentin. Pt educated on newly ordered med for sleep and scheduled gabapentin.  Pt did have night snack.  Q 15 minute safety rounding in place.  Will continue plan of care.   ? ? ?Problem: Education: ?Goal: Knowledge of  General Education information/materials will improve ?Outcome: Progressing ?Goal: Emotional status will improve ?Outcome: Progressing ?Goal: Mental status will improve ?Outcome: Progressing ?  ?

## 2022-01-05 NOTE — Group Note (Signed)
LCSW Group Therapy Note ? ?Group Date: 01/05/2022 ?Start Time: 1315 ?End Time: 1400 ? ? ?Type of Therapy and Topic:  Group Therapy - Healthy vs Unhealthy Coping Skills ? ?Participation Level:  Did Not Attend  ? ?Description of Group ?The focus of this group was to determine what unhealthy coping techniques typically are used by group members and what healthy coping techniques would be helpful in coping with various problems. Patients were guided in becoming aware of the differences between healthy and unhealthy coping techniques. Patients were asked to identify 2-3 healthy coping skills they would like to learn to use more effectively. ? ?Therapeutic Goals ?Patients learned that coping is what human beings do all day long to deal with various situations in their lives ?Patients defined and discussed healthy vs unhealthy coping techniques ?Patients identified their preferred coping techniques and identified whether these were healthy or unhealthy ?Patients determined 2-3 healthy coping skills they would like to become more familiar with and use more often. ?Patients provided support and ideas to each other ? ? ?Summary of Patient Progress:   ? ?X ? ?Therapeutic Modalities ?Cognitive Behavioral Therapy ?Motivational Interviewing ? ?Jeremy Shannon, [place] ?01/05/2022  3:22 PM   ?

## 2022-01-06 DIAGNOSIS — F332 Major depressive disorder, recurrent severe without psychotic features: Secondary | ICD-10-CM | POA: Diagnosis not present

## 2022-01-06 LAB — GLUCOSE, CAPILLARY
Glucose-Capillary: 105 mg/dL — ABNORMAL HIGH (ref 70–99)
Glucose-Capillary: 107 mg/dL — ABNORMAL HIGH (ref 70–99)
Glucose-Capillary: 204 mg/dL — ABNORMAL HIGH (ref 70–99)
Glucose-Capillary: 206 mg/dL — ABNORMAL HIGH (ref 70–99)

## 2022-01-06 MED ORDER — DIVALPROEX SODIUM 250 MG PO DR TAB
500.0000 mg | DELAYED_RELEASE_TABLET | Freq: Two times a day (BID) | ORAL | Status: DC
  Administered 2022-01-06 – 2022-01-07 (×3): 500 mg via ORAL
  Filled 2022-01-06 (×3): qty 2

## 2022-01-06 NOTE — Progress Notes (Signed)
Recreation Therapy Notes ? ? ?Date: 01/06/2022 ? ?Time: 1:40pm  ? ?Location: Craft room   ? ?Behavioral response: N/A ?  ?Intervention Topic: Wellness  ? ?Discussion/Intervention: ?Patient refused to attend group.  ? ?Clinical Observations/Feedback:  ?Patient refused to attend group.  ?  ?[name] LRT/CTRS ? ? ? ? ? ? ? ? ?Jeremy Shannon ?01/06/2022 2:59 PM ?

## 2022-01-06 NOTE — Progress Notes (Signed)
Pt sitting in day room; calm, cooperative. Pt states "I been feeling good all day. I slept good last night." He currently denies pain and SI/HI/AVH. He describes his sleep as "good" and his appetite as "really good". He denies depression, but states that he has anxiety, which he rates 7/10, due to "being in here in this place". No acute distress noted. ?

## 2022-01-06 NOTE — Progress Notes (Signed)
[facility] MD Progress Note ? ?01/06/2022 10:27 AM ?[name]  ?MRN:  [id number] ?Subjective: Jeremy Shannon continues to have a lot of anxiety.  He is very tolerant medications.  He was on Seroquel 300 mg twice a day for his anxiety before he took his overdose.  He does not want to go back on Seroquel.  He says the trazodone in the daytime has helped a little bit.  He was on Xanax for 20 years up until a year ago.  He states that he did sleep better last night with a higher dose of doxepin and Atarax.  He says he does not notice anything with the Risperdal or the Neurontin.  I talked to him about making some medication changes again. ? ?Principal Problem: Major depressive disorder, recurrent severe without psychotic features ([place]) ?Diagnosis: Principal Problem: ?  Major depressive disorder, recurrent severe without psychotic features ([place]) ? ?Total Time spent with patient: 15 minutes ? 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  He has been on Rexulti.  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  States he goes to [facility] for the past year.  There is no inpatient admissions noted. ? ?Past Medical History:  ?Past Medical History:  ?Diagnosis Date  ? Anemia   ? pt denies  ? Anxiety   ? Cataract   ? Depression   ? Diabetes mellitus without complication ([place])   ? type 2  ? Dyspnea   ? Hyperlipidemia   ? PONV (postoperative nausea and vomiting)   ?  ?Past Surgical History:  ?Procedure Laterality Date  ? BRONCHIAL BIOPSY  02/16/2021  ? Procedure: BRONCHIAL BIOPSIES;  Surgeon: Garner Nash, DO;  Location: [place] [facility];  Service: Pulmonary;;  ? BRONCHIAL BRUSHINGS  02/16/2021  ? Procedure: BRONCHIAL BRUSHINGS;  Surgeon: Garner Nash, DO;  Location: [place];  Service: Pulmonary;;  ? BRONCHIAL NEEDLE ASPIRATION BIOPSY  02/16/2021  ? Procedure: BRONCHIAL NEEDLE ASPIRATION BIOPSIES;  Surgeon: Garner Nash, DO;  Location:  [place];  Service: Pulmonary;;  ? BRONCHIAL WASHINGS  02/16/2021  ? Procedure: BRONCHIAL WASHINGS;  Surgeon: Garner Nash, DO;  Location: [place] [facility];  Service: Pulmonary;;  ? CATARACT EXTRACTION    ? bilateral  ? CHOLECYSTECTOMY N/A 02/10/2017  ? Procedure: LAPAROSCOPIC CHOLECYSTECTOMY WITH INTRAOPERATIVE CHOLANGIOGRAM;  Surgeon: Alphonsa Overall, MD;  Location: [facility];  Service: General;  Laterality: N/A;  ? COLONOSCOPY    ? FIDUCIAL MARKER PLACEMENT  02/16/2021  ? Procedure: FIDUCIAL MARKER PLACEMENT;  Surgeon: Garner Nash, DO;  Location: [place] [facility];  Service: Pulmonary;;  ? VIDEO BRONCHOSCOPY WITH ENDOBRONCHIAL NAVIGATION Right 02/16/2021  ? Procedure: VIDEO BRONCHOSCOPY WITH ENDOBRONCHIAL NAVIGATION;  Surgeon: Garner Nash, DO;  Location: [place];  Service: Pulmonary;  Laterality: Right;  ? ?Family History:  ?Family History  ?Problem Relation Age of Onset  ? Liver cancer Mother   ? Depression Father   ? Drug abuse Father   ? ? ?Social History:  ?Social History  ? ?Substance and Sexual Activity  ?Alcohol Use No  ?   ?Social History  ? ?Substance and Sexual Activity  ?Drug Use No  ?  ?Social History  ? ?Socioeconomic History  ? Marital status: Married  ?  Spouse name: Not on file  ? Number of children: Not on file  ? Years of education: Not on file  ? Highest education level: Not on file  ?Occupational History  ?  Not on file  ?Tobacco Use  ? Smoking status: Every Day  ?  Packs/day: 1.00  ?  Years: 43.00  ?  Pack years: 43.00  ?  Types: Cigarettes  ?  Passive exposure: Current  ? Smokeless tobacco: Never  ? Tobacco comments:  ?  10-20 cigarettes smoked daily 02/17/2021  ?Vaping Use  ? Vaping Use: Never used  ?Substance and Sexual Activity  ? Alcohol use: No  ? Drug use: No  ? Sexual activity: Yes  ?  Partners: Female  ?  Birth control/protection: None  ?Other Topics Concern  ? Not on file  ?Social History Narrative  ? Patient is currently a smoker and states he is not ready to quit  ? ?Social  Determinants of Health  ? ?Financial Resource Strain: Not on file  ?Food Insecurity: Not on file  ?Transportation Needs: Not on file  ?Physical Activity: Not on file  ?Stress: Not on file  ?Social Connections: Not on file  ? ?Additional Social History:  ?  ?  ?  ?  ?  ?  ?  ?  ?  ?  ?  ? ?Sleep: Fair ? ?Appetite:  Good ? ?Current Medications: ?Current Facility-Administered Medications  ?Medication Dose Route Frequency Provider Last Rate Last Admin  ? acetaminophen (TYLENOL) tablet 650 mg  650 mg Oral Q6H PRN Patrecia Pour, NP      ? alum & mag hydroxide-simeth (MAALOX/MYLANTA) 200-200-20 MG/5ML suspension 30 mL  30 mL Oral Q4H PRN Patrecia Pour, NP      ? doxepin (SINEQUAN) capsule 150 mg  150 mg Oral QHS Parks Ranger, DO   150 mg at 01/05/22 2117  ? empagliflozin (JARDIANCE) tablet 10 mg  10 mg Oral Daily Patrecia Pour, NP   10 mg at 01/06/22 4818  ? FLUoxetine (PROZAC) capsule 20 mg  20 mg Oral Daily Parks Ranger, DO   20 mg at 01/06/22 5631  ? hydrOXYzine (ATARAX) tablet 100 mg  100 mg Oral QHS PRN Parks Ranger, DO   100 mg at 01/05/22 2117  ? icosapent Ethyl (VASCEPA) 1 g capsule 1 g  1 g Oral BID Patrecia Pour, NP   1 g at 01/06/22 4970  ? insulin aspart (novoLOG) injection 0-15 Units  0-15 Units Subcutaneous TID WC Patrecia Pour, NP   2 Units at 01/05/22 1659  ? magnesium hydroxide (MILK OF MAGNESIA) suspension 30 mL  30 mL Oral Daily PRN Patrecia Pour, NP      ? nicotine (NICODERM CQ - dosed in mg/24 hours) patch 14 mg  14 mg Transdermal Daily Parks Ranger, DO   14 mg at 01/06/22 2637  ? simvastatin (ZOCOR) tablet 20 mg  20 mg Oral QHS Patrecia Pour, NP   20 mg at 01/05/22 2117  ? tamsulosin (FLOMAX) capsule 0.4 mg  0.4 mg Oral Daily Patrecia Pour, NP   0.4 mg at 01/06/22 8588  ? traZODone (DESYREL) tablet 50 mg  50 mg Oral Q4H PRN Parks Ranger, DO   50 mg at 01/06/22 0451  ? ? ?Lab Results:  ?Results for orders placed or performed during  the hospital encounter of 12/31/21 (from the past 48 hour(s))  ?Glucose, capillary     Status: Abnormal  ? Collection Time: 01/04/22 11:51 AM  ?Result Value Ref Range  ? Glucose-Capillary 105 (H) 70 - 99 mg/dL  ?  Comment: Glucose reference range applies only to samples taken after  fasting for at least 8 hours.  ?Glucose, capillary     Status: Abnormal  ? Collection Time: 01/04/22  4:30 PM  ?Result Value Ref Range  ? Glucose-Capillary 134 (H) 70 - 99 mg/dL  ?  Comment: Glucose reference range applies only to samples taken after fasting for at least 8 hours.  ?Glucose, capillary     Status: Abnormal  ? Collection Time: 01/04/22  8:00 PM  ?Result Value Ref Range  ? Glucose-Capillary 104 (H) 70 - 99 mg/dL  ?  Comment: Glucose reference range applies only to samples taken after fasting for at least 8 hours.  ?Glucose, capillary     Status: Abnormal  ? Collection Time: 01/05/22  8:03 AM  ?Result Value Ref Range  ? Glucose-Capillary 120 (H) 70 - 99 mg/dL  ?  Comment: Glucose reference range applies only to samples taken after fasting for at least 8 hours.  ?Glucose, capillary     Status: None  ? Collection Time: 01/05/22 11:39 AM  ?Result Value Ref Range  ? Glucose-Capillary 99 70 - 99 mg/dL  ?  Comment: Glucose reference range applies only to samples taken after fasting for at least 8 hours.  ?Glucose, capillary     Status: Abnormal  ? Collection Time: 01/05/22  4:26 PM  ?Result Value Ref Range  ? Glucose-Capillary 132 (H) 70 - 99 mg/dL  ?  Comment: Glucose reference range applies only to samples taken after fasting for at least 8 hours.  ?Glucose, capillary     Status: Abnormal  ? Collection Time: 01/06/22  7:53 AM  ?Result Value Ref Range  ? Glucose-Capillary 105 (H) 70 - 99 mg/dL  ?  Comment: Glucose reference range applies only to samples taken after fasting for at least 8 hours.  ? ? ?Blood Alcohol level:  ?Lab Results  ?Component Value Date  ? ETH <10 12/29/2021  ? ETH <10 03/26/2021  ? ? ?Metabolic Disorder  Labs: ?Lab Results  ?Component Value Date  ? HGBA1C 5.8 (H) 12/31/2021  ? MPG 119.76 12/31/2021  ? MPG 126 02/08/2017  ? ?No results found for: PROLACTIN ?Lab Results  ?Component Value Date  ? CHOL 149 11/11/2021

## 2022-01-06 NOTE — [facility] Group Notes (Signed)
[facility] Group Notes:  (Nursing/MHT/Case Management/Adjunct) ? ?Date:  01/06/2022  ?Time:  11:13 AM ? ?Type of Therapy:  Psychoeducational Skills ? ?Participation Level:  Minimal ? ?Participation Quality:  Appropriate and Attentive ? ?Affect:  Appropriate ? ?Cognitive:  Alert, Appropriate, and Oriented ? ?Insight:  Improving ? ?Engagement in Group:  Developing/Improving ? ?Modes of Intervention:  Activity, Discussion, Education, and Support ? ?Summary of Progress/Problems: ? ?Jeremy Shannon ?01/06/2022, 11:13 AM ?

## 2022-01-06 NOTE — Plan of Care (Signed)
Patient Calm and Cooperative during assessment.  Pt denies SI/HI/Avh and contracts for safety.  Pt did report 8/10 Anxiety but denies Depression.  Pt did request Trazodone to manage symptoms.  Patient did participate in therapeutic milieu and had night snack. Encouragement and support given during shift.  Wife did visit and questioned plan of care. Writer did inform of medication management. Q 15 minute safety checks in place. Will continue plan of care. ? ?Problem: Education: ?Goal: Knowledge of [facility] Education information/materials will improve ?Outcome: Progressing ?Goal: Emotional status will improve ?Outcome: Progressing ?Goal: Mental status will improve ?Outcome: Progressing ?Goal: Verbalization of understanding the information provided will improve ?Outcome: Progressing ?  ?Problem: Coping: ?Goal: Ability to verbalize frustrations and anger appropriately will improve ?Outcome: Progressing ?  ?

## 2022-01-06 NOTE — Progress Notes (Signed)
Patient denies SI, HI, and AVH. He denies depression but rates anxiety as a 7/10. He says he does not know what triggers his anxiety, but says that it may be because he is still in the hospital. Patient says he slept better last night. He is compliant with scheduled medications. Patient attended morning group and is observed to be interacting appropriately with staff and other patients on the unit. Patient remains safe on the unit at this time. ?

## 2022-01-07 DIAGNOSIS — F332 Major depressive disorder, recurrent severe without psychotic features: Secondary | ICD-10-CM | POA: Diagnosis not present

## 2022-01-07 LAB — GLUCOSE, CAPILLARY
Glucose-Capillary: 95 mg/dL (ref 70–99)
Glucose-Capillary: 87 mg/dL (ref 70–99)
Glucose-Capillary: 142 mg/dL — ABNORMAL HIGH (ref 70–99)
Glucose-Capillary: 145 mg/dL — ABNORMAL HIGH (ref 70–99)

## 2022-01-07 MED ORDER — DIVALPROEX SODIUM 250 MG PO DR TAB
500.0000 mg | DELAYED_RELEASE_TABLET | ORAL | Status: DC
  Administered 2022-01-07 – 2022-01-12 (×11): 500 mg via ORAL
  Filled 2022-01-07 (×11): qty 2

## 2022-01-07 NOTE — Progress Notes (Signed)
[facility] MD Progress Note ? ?01/07/2022 10:40 AM ?Jeremy Shannon  ?MRN:  [id number] ?Subjective: Jeremy Shannon is seen on rounds today.  He states he did not sleep very well last night.  I started him on Depakote yesterday and he states that he thinks that it does help.  He is getting every 12 hours I told him I will change it to 8 AM and 4 PM in the daytime because that is when he has most of his anxiety.  I informed him that he should not rely on medication to control his anxiety because he would just be sleeping all the time.  I told him that he needs to find a therapist to work through some of his anxiety and he agreed.  He currently denies any suicidal ideation.  He thinks his depression is getting better. ? ?Principal Problem: Major depressive disorder, recurrent severe without psychotic features ([place]) ?Diagnosis: Principal Problem: ?  Major depressive disorder, recurrent severe without psychotic features ([place]) ? ?Total Time spent with patient: 15 minutes ? 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  He has been on Rexulti.  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  States he goes to [facility] for the past year.  There is no inpatient admissions noted. ? ?Past Medical History:  ?Past Medical History:  ?Diagnosis Date  ? Anemia   ? pt denies  ? Anxiety   ? Cataract   ? Depression   ? Diabetes mellitus without complication ([place])   ? type 2  ? Dyspnea   ? Hyperlipidemia   ? PONV (postoperative nausea and vomiting)   ?  ?Past Surgical History:  ?Procedure Laterality Date  ? BRONCHIAL BIOPSY  02/16/2021  ? Procedure: BRONCHIAL BIOPSIES;  Surgeon: Garner Nash, DO;  Location: [place] [facility];  Service: Pulmonary;;  ? BRONCHIAL BRUSHINGS  02/16/2021  ? Procedure: BRONCHIAL BRUSHINGS;  Surgeon: Garner Nash, DO;  Location: [place];  Service: Pulmonary;;  ? BRONCHIAL NEEDLE ASPIRATION BIOPSY  02/16/2021  ? Procedure:  BRONCHIAL NEEDLE ASPIRATION BIOPSIES;  Surgeon: Garner Nash, DO;  Location: [place];  Service: Pulmonary;;  ? BRONCHIAL WASHINGS  02/16/2021  ? Procedure: BRONCHIAL WASHINGS;  Surgeon: Garner Nash, DO;  Location: [place] [facility];  Service: Pulmonary;;  ? CATARACT EXTRACTION    ? bilateral  ? CHOLECYSTECTOMY N/A 02/10/2017  ? Procedure: LAPAROSCOPIC CHOLECYSTECTOMY WITH INTRAOPERATIVE CHOLANGIOGRAM;  Surgeon: Alphonsa Overall, MD;  Location: [facility];  Service: General;  Laterality: N/A;  ? COLONOSCOPY    ? FIDUCIAL MARKER PLACEMENT  02/16/2021  ? Procedure: FIDUCIAL MARKER PLACEMENT;  Surgeon: Garner Nash, DO;  Location: [place] [facility];  Service: Pulmonary;;  ? VIDEO BRONCHOSCOPY WITH ENDOBRONCHIAL NAVIGATION Right 02/16/2021  ? Procedure: VIDEO BRONCHOSCOPY WITH ENDOBRONCHIAL NAVIGATION;  Surgeon: Garner Nash, DO;  Location: [place];  Service: Pulmonary;  Laterality: Right;  ? ?Family History:  ?Family History  ?Problem Relation Age of Onset  ? Liver cancer Mother   ? Depression Father   ? Drug abuse Father   ? ? ?Social History:  ?Social History  ? ?Substance and Sexual Activity  ?Alcohol Use No  ?   ?Social History  ? ?Substance and Sexual Activity  ?Drug Use No  ?  ?Social History  ? ?Socioeconomic History  ? Marital status: Married  ?  Spouse name: Not on file  ? Number of children: Not on file  ? Years of  education: Not on file  ? Highest education level: Not on file  ?Occupational History  ? Not on file  ?Tobacco Use  ? Smoking status: Every Day  ?  Packs/day: 1.00  ?  Years: 43.00  ?  Pack years: 43.00  ?  Types: Cigarettes  ?  Passive exposure: Current  ? Smokeless tobacco: Never  ? Tobacco comments:  ?  10-20 cigarettes smoked daily 02/17/2021  ?Vaping Use  ? Vaping Use: Never used  ?Substance and Sexual Activity  ? Alcohol use: No  ? Drug use: No  ? Sexual activity: Yes  ?  Partners: Female  ?  Birth control/protection: None  ?Other Topics Concern  ? Not on file  ?Social History Narrative  ?  Patient is currently a smoker and states he is not ready to quit  ? ?Social Determinants of Health  ? ?Financial Resource Strain: Not on file  ?Food Insecurity: Not on file  ?Transportation Needs: Not on file  ?Physical Activity: Not on file  ?Stress: Not on file  ?Social Connections: Not on file  ? ?Additional Social History:  ?  ?  ?  ?  ?  ?  ?  ?  ?  ?  ?  ? ?Sleep: Fair ? ?Appetite:  Good ? ?Current Medications: ?Current Facility-Administered Medications  ?Medication Dose Route Frequency Provider Last Rate Last Admin  ? acetaminophen (TYLENOL) tablet 650 mg  650 mg Oral Q6H PRN Patrecia Pour, NP      ? alum & mag hydroxide-simeth (MAALOX/MYLANTA) 200-200-20 MG/5ML suspension 30 mL  30 mL Oral Q4H PRN Patrecia Pour, NP      ? divalproex (DEPAKOTE) DR tablet 500 mg  500 mg Oral [id number] Kathrine Rieves Percell Miller, DO      ? doxepin (SINEQUAN) capsule 150 mg  150 mg Oral QHS Parks Ranger, DO   150 mg at 01/06/22 2117  ? empagliflozin (JARDIANCE) tablet 10 mg  10 mg Oral Daily Patrecia Pour, NP   10 mg at 01/07/22 2426  ? FLUoxetine (PROZAC) capsule 20 mg  20 mg Oral Daily Parks Ranger, DO   20 mg at 01/07/22 8341  ? hydrOXYzine (ATARAX) tablet 100 mg  100 mg Oral QHS PRN Parks Ranger, DO   100 mg at 01/06/22 2116  ? icosapent Ethyl (VASCEPA) 1 g capsule 1 g  1 g Oral BID Patrecia Pour, NP   1 g at 01/07/22 9622  ? insulin aspart (novoLOG) injection 0-15 Units  0-15 Units Subcutaneous TID WC Patrecia Pour, NP   5 Units at 01/06/22 1619  ? magnesium hydroxide (MILK OF MAGNESIA) suspension 30 mL  30 mL Oral Daily PRN Patrecia Pour, NP      ? nicotine (NICODERM CQ - dosed in mg/24 hours) patch 14 mg  14 mg Transdermal Daily Parks Ranger, DO   14 mg at 01/07/22 2979  ? simvastatin (ZOCOR) tablet 20 mg  20 mg Oral QHS Patrecia Pour, NP   20 mg at 01/06/22 2117  ? tamsulosin (FLOMAX) capsule 0.4 mg  0.4 mg Oral Daily Patrecia Pour, NP   0.4 mg at 01/07/22 8921  ?  traZODone (DESYREL) tablet 50 mg  50 mg Oral Q4H PRN Parks Ranger, DO   50 mg at 01/07/22 0036  ? ? ?Lab Results:  ?Results for orders placed or performed during the hospital encounter of 12/31/21 (from the past 48 hour(s))  ?Glucose, capillary  Status: None  ? Collection Time: 01/05/22 11:39 AM  ?Result Value Ref Range  ? Glucose-Capillary 99 70 - 99 mg/dL  ?  Comment: Glucose reference range applies only to samples taken after fasting for at least 8 hours.  ?Glucose, capillary     Status: Abnormal  ? Collection Time: 01/05/22  4:26 PM  ?Result Value Ref Range  ? Glucose-Capillary 132 (H) 70 - 99 mg/dL  ?  Comment: Glucose reference range applies only to samples taken after fasting for at least 8 hours.  ?Glucose, capillary     Status: Abnormal  ? Collection Time: 01/06/22  7:53 AM  ?Result Value Ref Range  ? Glucose-Capillary 105 (H) 70 - 99 mg/dL  ?  Comment: Glucose reference range applies only to samples taken after fasting for at least 8 hours.  ?Glucose, capillary     Status: Abnormal  ? Collection Time: 01/06/22 11:09 AM  ?Result Value Ref Range  ? Glucose-Capillary 107 (H) 70 - 99 mg/dL  ?  Comment: Glucose reference range applies only to samples taken after fasting for at least 8 hours.  ?Glucose, capillary     Status: Abnormal  ? Collection Time: 01/06/22  4:15 PM  ?Result Value Ref Range  ? Glucose-Capillary 204 (H) 70 - 99 mg/dL  ?  Comment: Glucose reference range applies only to samples taken after fasting for at least 8 hours.  ?Glucose, capillary     Status: Abnormal  ? Collection Time: 01/06/22  9:12 PM  ?Result Value Ref Range  ? Glucose-Capillary 206 (H) 70 - 99 mg/dL  ?  Comment: Glucose reference range applies only to samples taken after fasting for at least 8 hours.  ? Comment 1 Notify RN   ?Glucose, capillary     Status: None  ? Collection Time: 01/07/22  7:57 AM  ?Result Value Ref Range  ? Glucose-Capillary 95 70 - 99 mg/dL  ?  Comment: Glucose reference range applies only to  samples taken after fasting for at least 8 hours.  ? ? ?Blood Alcohol level:  ?Lab Results  ?Component Value Date  ? ETH <10 12/29/2021  ? ETH <10 03/26/2021  ? ? ?Metabolic Disorder Labs: ?Lab Results  ?[place]

## 2022-01-07 NOTE — Progress Notes (Signed)
Patient denies SI, HI, and AVH. Patient continues to endorse anxiety and rates it as a 7/10. Patient denies pain and other physical concerns. Patient is compliant with scheduled medications. Patient remains safe on the unit at this time. ?

## 2022-01-08 DIAGNOSIS — F332 Major depressive disorder, recurrent severe without psychotic features: Secondary | ICD-10-CM | POA: Diagnosis not present

## 2022-01-08 LAB — GLUCOSE, CAPILLARY
Glucose-Capillary: 85 mg/dL (ref 70–99)
Glucose-Capillary: 97 mg/dL (ref 70–99)
Glucose-Capillary: 90 mg/dL (ref 70–99)
Glucose-Capillary: 82 mg/dL (ref 70–99)

## 2022-01-08 MED ORDER — CLONIDINE HCL 0.1 MG PO TABS
0.1000 mg | ORAL_TABLET | Freq: Once | ORAL | Status: AC
  Administered 2022-01-08: 0.1 mg via ORAL
  Filled 2022-01-08: qty 1

## 2022-01-08 MED ORDER — CLONIDINE ORAL SUSPENSION 10 MCG/ML: 0.1000 mg | Freq: Once | ORAL | Status: DC

## 2022-01-08 NOTE — Plan of Care (Signed)
?  Problem: Education: ?Goal: Mental status will improve ?Outcome: Progressing ?Goal: Verbalization of understanding the information provided will improve ?Outcome: Progressing ?  ?Problem: Coping: ?Goal: Ability to verbalize frustrations and anger appropriately will improve ?Outcome: Progressing ?  ?

## 2022-01-08 NOTE — Group Note (Signed)
LCSW Group Therapy Note ? ?Group Date: 01/08/2022 ?Start Time: 1300 ?End Time: 1400 ? ? ?Type of Therapy and Topic:  Group Therapy - How To Cope with Nervousness about Discharge  ? ?Participation Level:  Did Not Attend  ? ?Description of Group ?This process group involved identification of patients' feelings about discharge. Some of them are scheduled to be discharged soon, while others are new admissions, but each of them was asked to share thoughts and feelings surrounding discharge from the hospital. One common theme was that they are excited at the prospect of going home, while another was that many of them are apprehensive about sharing why they were hospitalized. Patients were given the opportunity to discuss these feelings with their peers in preparation for discharge. ? ?Therapeutic Goals ? ?Patient will identify their overall feelings about pending discharge. ?Patient will think about how they might proactively address issues that they believe will once again arise once they get home (i.e. with parents). ?Patients will participate in discussion about having hope for change. ? ? ?Summary of Patient Progress:   ?Group was not held due to staffing shortages; group will resume once staffing is adequate.  ? ? ?Therapeutic Modalities ?Cognitive Behavioral Therapy ? ? ?Durenda Hurt, LCSWA ?01/08/2022  3:13 PM   ?

## 2022-01-08 NOTE — Progress Notes (Addendum)
Patient is A&Ox4. Patient currently denies any pain. Patient denies SI/HI/ and A/V/H but does endorse anxiety. Patient rated his anxiety a 7 out of 10. Patient received trazadone x2 thus far this shift which provided temporary relief but insists on requiring more clonidine after his 1 time dose. Patient out in milieu throughout day and socializes appropriately. Patient encouraged to practice non-pharmacologic coping skills to assist with dealing with his anxiety. Patient absent from falls/injuries. Q15 minute safety checks per unit protocol.  ? ? 01/08/22 0900  ?Psych Admission Type (Psych Patients Only)  ?Admission Status Involuntary  ?Psychosocial Assessment  ?Patient Complaints Anxiety  ?Eye Contact Fair  ?Facial Expression Flat  ?Affect Anxious  ?Speech Logical/coherent  ?Interaction Assertive  ?Motor Activity Other (Comment) ?(Appropriate)  ?Appearance/Hygiene Unremarkable  ?Behavior Characteristics Cooperative;Anxious  ?Mood Depressed;Anxious  ?Thought Process  ?Coherency WDL  ?Content WDL  ?Delusions None reported or observed  ?Perception WDL  ?Hallucination None reported or observed  ?Judgment Impaired  ?Confusion None  ?Danger to Self  ?Current suicidal ideation? Denies  ?Agreement Not to Harm Self Yes  ?Description of Agreement Verbal  ?Danger to Others  ?Danger to Others None reported or observed  ? ? ?

## 2022-01-08 NOTE — Progress Notes (Signed)
[facility] MD Progress Note ? ?01/08/2022 12:23 PM ?Jeremy Shannon  ?MRN:  [id number] ? ?Principal Problem: Major depressive disorder, recurrent severe without psychotic features ([place]) ?Diagnosis: Principal Problem: ?  Major depressive disorder, recurrent severe without psychotic features ([place]) ? ?Patient is a  66y.o. male who presents to the [facility] unit due to worsened depression and anxiety. ? ? ?Interval History ?Patient was seen today for re-evaluation.  Nursing reports no events overnight. The patient has no issues with performing ADLs.  Patient has been medication compliant.   ? ?Subjective:  On assessment patient reports "I slept okay, but still wake up every two hours or so". Asking to add a medication for sleep. "My anxiety is high" - asking to add a medication for anxiety, but not Vistaril. However, says that anxiety is "a lot better" compare to admission. He does not see an difference in anxiety after Depakote intake time changed. Denies suicidal/homicidal ideation. Denies auditory/visual hallucinations.  ?The patient reports no side effects from medications.   ? ?Labs: no new results for review. ? ?  ? ?Total Time spent with patient: 30 minutes ? ?Past Psychiatric History: see H&P ? ?Past Medical History:  ?Past Medical History:  ?Diagnosis Date  ? Anemia   ? pt denies  ? Anxiety   ? Cataract   ? Depression   ? Diabetes mellitus without complication ([place])   ? type 2  ? Dyspnea   ? Hyperlipidemia   ? PONV (postoperative nausea and vomiting)   ?  ?Past Surgical History:  ?Procedure Laterality Date  ? BRONCHIAL BIOPSY  02/16/2021  ? Procedure: BRONCHIAL BIOPSIES;  Surgeon: Garner Nash, DO;  Location: [place] [facility];  Service: Pulmonary;;  ? BRONCHIAL BRUSHINGS  02/16/2021  ? Procedure: BRONCHIAL BRUSHINGS;  Surgeon: Garner Nash, DO;  Location: [place];  Service: Pulmonary;;  ? BRONCHIAL NEEDLE ASPIRATION BIOPSY  02/16/2021  ? Procedure: BRONCHIAL NEEDLE ASPIRATION BIOPSIES;  Surgeon: Garner Nash, DO;   Location: [place];  Service: Pulmonary;;  ? BRONCHIAL WASHINGS  02/16/2021  ? Procedure: BRONCHIAL WASHINGS;  Surgeon: Garner Nash, DO;  Location: [place] [facility];  Service: Pulmonary;;  ? CATARACT EXTRACTION    ? bilateral  ? CHOLECYSTECTOMY N/A 02/10/2017  ? Procedure: LAPAROSCOPIC CHOLECYSTECTOMY WITH INTRAOPERATIVE CHOLANGIOGRAM;  Surgeon: Alphonsa Overall, MD;  Location: [facility];  Service: General;  Laterality: N/A;  ? COLONOSCOPY    ? FIDUCIAL MARKER PLACEMENT  02/16/2021  ? Procedure: FIDUCIAL MARKER PLACEMENT;  Surgeon: Garner Nash, DO;  Location: [place] [facility];  Service: Pulmonary;;  ? VIDEO BRONCHOSCOPY WITH ENDOBRONCHIAL NAVIGATION Right 02/16/2021  ? Procedure: VIDEO BRONCHOSCOPY WITH ENDOBRONCHIAL NAVIGATION;  Surgeon: Garner Nash, DO;  Location: [place];  Service: Pulmonary;  Laterality: Right;  ? ?Family History:  ?Family History  ?Problem Relation Age of Onset  ? Liver cancer Mother   ? Depression Father   ? Drug abuse Father   ? ?Family Psychiatric  History: see H&P ?Social History:  ?Social History  ? ?Substance and Sexual Activity  ?Alcohol Use No  ?   ?Social History  ? ?Substance and Sexual Activity  ?Drug Use No  ?  ?Social History  ? ?Socioeconomic History  ? Marital status: Married  ?  Spouse name: Not on file  ? Number of children: Not on file  ? Years of education: Not on file  ? Highest education level: Not on file  ?Occupational History  ? Not on file  ?Tobacco Use  ? Smoking status:  Every Day  ?  Packs/day: 1.00  ?  Years: 43.00  ?  Pack years: 43.00  ?  Types: Cigarettes  ?  Passive exposure: Current  ? Smokeless tobacco: Never  ? Tobacco comments:  ?  10-20 cigarettes smoked daily 02/17/2021  ?Vaping Use  ? Vaping Use: Never used  ?Substance and Sexual Activity  ? Alcohol use: No  ? Drug use: No  ? Sexual activity: Yes  ?  Partners: Female  ?  Birth control/protection: None  ?Other Topics Concern  ? Not on file  ?Social History Narrative  ? Patient is currently a smoker and  states he is not ready to quit  ? ?Social Determinants of Health  ? ?Financial Resource Strain: Not on file  ?Food Insecurity: Not on file  ?Transportation Needs: Not on file  ?Physical Activity: Not on file  ?Stress: Not on file  ?Social Connections: Not on file  ? ?Additional Social History:  ?  ?  ?  ?  ?  ?  ?  ?  ?  ?  ?  ? ?Sleep: Fair ? ?Appetite:  Fair ? ?Current Medications: ?Current Facility-Administered Medications  ?Medication Dose Route Frequency Provider Last Rate Last Admin  ? acetaminophen (TYLENOL) tablet 650 mg  650 mg Oral Q6H PRN Patrecia Pour, NP      ? alum & mag hydroxide-simeth (MAALOX/MYLANTA) 200-200-20 MG/5ML suspension 30 mL  30 mL Oral Q4H PRN Patrecia Pour, NP      ? divalproex (DEPAKOTE) DR tablet 500 mg  500 mg Oral BH-[id number] Parks Ranger, DO   500 mg at 01/08/22 0900  ? doxepin (SINEQUAN) capsule 150 mg  150 mg Oral QHS Parks Ranger, DO   150 mg at 01/07/22 2110  ? empagliflozin (JARDIANCE) tablet 10 mg  10 mg Oral Daily Patrecia Pour, NP   10 mg at 01/08/22 0900  ? FLUoxetine (PROZAC) capsule 20 mg  20 mg Oral Daily Parks Ranger, DO   20 mg at 01/08/22 0900  ? icosapent Ethyl (VASCEPA) 1 g capsule 1 g  1 g Oral BID Patrecia Pour, NP   1 g at 01/08/22 0900  ? insulin aspart (novoLOG) injection 0-15 Units  0-15 Units Subcutaneous TID WC Patrecia Pour, NP   2 Units at 01/07/22 1628  ? magnesium hydroxide (MILK OF MAGNESIA) suspension 30 mL  30 mL Oral Daily PRN Patrecia Pour, NP      ? nicotine (NICODERM CQ - dosed in mg/24 hours) patch 14 mg  14 mg Transdermal Daily Parks Ranger, DO   14 mg at 01/08/22 0900  ? simvastatin (ZOCOR) tablet 20 mg  20 mg Oral QHS Patrecia Pour, NP   20 mg at 01/07/22 2110  ? tamsulosin (FLOMAX) capsule 0.4 mg  0.4 mg Oral Daily Patrecia Pour, NP   0.4 mg at 01/08/22 0900  ? traZODone (DESYREL) tablet 50 mg  50 mg Oral Q4H PRN Parks Ranger, DO   50 mg at 01/08/22 4196  ? ? ?Lab Results:   ?Results for orders placed or performed during the hospital encounter of 12/31/21 (from the past 48 hour(s))  ?Glucose, capillary     Status: Abnormal  ? Collection Time: 01/06/22  4:15 PM  ?Result Value Ref Range  ? Glucose-Capillary 204 (H) 70 - 99 mg/dL  ?  Comment: Glucose reference range applies only to samples taken after fasting for at least 8 hours.  ?Glucose, capillary  Status: Abnormal  ? Collection Time: 01/06/22  9:12 PM  ?Result Value Ref Range  ? Glucose-Capillary 206 (H) 70 - 99 mg/dL  ?  Comment: Glucose reference range applies only to samples taken after fasting for at least 8 hours.  ? Comment 1 Notify RN   ?Glucose, capillary     Status: None  ? Collection Time: 01/07/22  7:57 AM  ?Result Value Ref Range  ? Glucose-Capillary 95 70 - 99 mg/dL  ?  Comment: Glucose reference range applies only to samples taken after fasting for at least 8 hours.  ?Glucose, capillary     Status: None  ? Collection Time: 01/07/22 11:37 AM  ?Result Value Ref Range  ? Glucose-Capillary 87 70 - 99 mg/dL  ?  Comment: Glucose reference range applies only to samples taken after fasting for at least 8 hours.  ?Glucose, capillary     Status: Abnormal  ? Collection Time: 01/07/22  4:22 PM  ?Result Value Ref Range  ? Glucose-Capillary 142 (H) 70 - 99 mg/dL  ?  Comment: Glucose reference range applies only to samples taken after fasting for at least 8 hours.  ?Glucose, capillary     Status: Abnormal  ? Collection Time: 01/07/22  9:43 PM  ?Result Value Ref Range  ? Glucose-Capillary 145 (H) 70 - 99 mg/dL  ?  Comment: Glucose reference range applies only to samples taken after fasting for at least 8 hours.  ? Comment 1 Notify RN   ?Glucose, capillary     Status: None  ? Collection Time: 01/08/22  8:13 AM  ?Result Value Ref Range  ? Glucose-Capillary 85 70 - 99 mg/dL  ?  Comment: Glucose reference range applies only to samples taken after fasting for at least 8 hours.  ?Glucose, capillary     Status: None  ? Collection Time:  01/08/22 11:56 AM  ?Result Value Ref Range  ? Glucose-Capillary 97 70 - 99 mg/dL  ?  Comment: Glucose reference range applies only to samples taken after fasting for at least 8 hours.  ? ? ?Blood Alcohol le

## 2022-01-08 NOTE — BH IP Treatment Plan (Signed)
Interdisciplinary Treatment and Diagnostic Plan Update ? ?01/08/2022 ?Time of Session: 0938  ?Jeremy Shannon ?MRN: [id number] ? ?Principal Diagnosis: Major depressive disorder, recurrent severe without psychotic features ([place]) ? ?Secondary Diagnoses: Principal Problem: ?  Major depressive disorder, recurrent severe without psychotic features ([place]) ? ? ?Current Medications:  ?Current Facility-Administered Medications  ?Medication Dose Route Frequency Provider Last Rate Last Admin  ? acetaminophen (TYLENOL) tablet 650 mg  650 mg Oral Q6H PRN Patrecia Pour, NP      ? alum & mag hydroxide-simeth (MAALOX/MYLANTA) 200-200-20 MG/5ML suspension 30 mL  30 mL Oral Q4H PRN Patrecia Pour, NP      ? divalproex (DEPAKOTE) DR tablet 500 mg  500 mg Oral BH-[id number] Parks Ranger, DO   500 mg at 01/08/22 0900  ? doxepin (SINEQUAN) capsule 150 mg  150 mg Oral QHS Parks Ranger, DO   150 mg at 01/07/22 2110  ? empagliflozin (JARDIANCE) tablet 10 mg  10 mg Oral Daily Patrecia Pour, NP   10 mg at 01/08/22 0900  ? FLUoxetine (PROZAC) capsule 20 mg  20 mg Oral Daily Parks Ranger, DO   20 mg at 01/08/22 0900  ? hydrOXYzine (ATARAX) tablet 100 mg  100 mg Oral QHS PRN Parks Ranger, DO   100 mg at 01/06/22 2116  ? icosapent Ethyl (VASCEPA) 1 g capsule 1 g  1 g Oral BID Patrecia Pour, NP   1 g at 01/08/22 0900  ? insulin aspart (novoLOG) injection 0-15 Units  0-15 Units Subcutaneous TID WC Patrecia Pour, NP   2 Units at 01/07/22 1628  ? magnesium hydroxide (MILK OF MAGNESIA) suspension 30 mL  30 mL Oral Daily PRN Patrecia Pour, NP      ? nicotine (NICODERM CQ - dosed in mg/24 hours) patch 14 mg  14 mg Transdermal Daily Parks Ranger, DO   14 mg at 01/08/22 0900  ? simvastatin (ZOCOR) tablet 20 mg  20 mg Oral QHS Patrecia Pour, NP   20 mg at 01/07/22 2110  ? tamsulosin (FLOMAX) capsule 0.4 mg  0.4 mg Oral Daily Patrecia Pour, NP   0.4 mg at 01/08/22 0900  ? traZODone (DESYREL)  tablet 50 mg  50 mg Oral Q4H PRN Parks Ranger, DO   50 mg at 01/08/22 0944  ? ?PTA Medications: ?Medications Prior to Admission  ?Medication Sig Dispense Refill Last Dose  ? empagliflozin (JARDIANCE) 10 MG TABS tablet Take 10 mg by mouth daily.     ? icosapent Ethyl (VASCEPA) 1 g capsule Take 2 capsules (2 g total) by mouth 2 (two) times daily. (Patient taking differently: Take 1 g by mouth 2 (two) times daily.) 360 capsule 1   ? simvastatin (ZOCOR) 20 MG tablet Take 1 tablet (20 mg total) by mouth at bedtime. 90 tablet 1   ? tamsulosin (FLOMAX) 0.4 MG CAPS capsule Take 1 capsule (0.4 mg total) by mouth daily. 30 capsule 0   ? ? ?Patient Stressors: Financial difficulties   ? ?Patient Strengths: Supportive family/friends  ? ?Treatment Modalities: Medication Management, Group therapy, Case management,  ?1 to 1 session with clinician, Psychoeducation, Recreational therapy. ? ? ?Physician Treatment Plan for Primary Diagnosis: Major depressive disorder, recurrent severe without psychotic features ([place]) ?Long Term Goal(s): Improvement in symptoms so as ready for discharge  ? ?Short Term Goals: Ability to demonstrate self-control will improve ?Ability to identify changes in lifestyle to reduce recurrence of condition will improve ?Ability  to verbalize feelings will improve ? ?Medication Management: Evaluate patient's response, side effects, and tolerance of medication regimen. ? ?Therapeutic Interventions: 1 to 1 sessions, Unit Group sessions and Medication administration. ? ?Evaluation of Outcomes: Not Met ? ?Physician Treatment Plan for Secondary Diagnosis: Principal Problem: ?  Major depressive disorder, recurrent severe without psychotic features ([place]) ? ?Long Term Goal(s): Improvement in symptoms so as ready for discharge  ? ?Short Term Goals: Ability to demonstrate self-control will improve ?Ability to identify changes in lifestyle to reduce recurrence of condition will improve ?Ability to verbalize  feelings will improve    ? ?Medication Management: Evaluate patient's response, side effects, and tolerance of medication regimen. ? ?Therapeutic Interventions: 1 to 1 sessions, Unit Group sessions and Medication administration. ? ?Evaluation of Outcomes: Not Met ? ? ?RN Treatment Plan for Primary Diagnosis: Major depressive disorder, recurrent severe without psychotic features ([place]) ?Long Term Goal(s): Knowledge of disease and therapeutic regimen to maintain health will improve ? ?Short Term Goals: Ability to remain free from injury will improve, Ability to verbalize frustration and anger appropriately will improve, Ability to demonstrate self-control, Ability to participate in decision making will improve, Ability to verbalize feelings will improve, Ability to disclose and discuss suicidal ideas, Ability to identify and develop effective coping behaviors will improve, and Compliance with prescribed medications will improve ? ?Medication Management: RN will administer medications as ordered by provider, will assess and evaluate patient's response and provide education to patient for prescribed medication. RN will report any adverse and/or side effects to prescribing provider. ? ?Therapeutic Interventions: 1 on 1 counseling sessions, Psychoeducation, Medication administration, Evaluate responses to treatment, Monitor vital signs and CBGs as ordered, Perform/monitor CIWA, COWS, AIMS and Fall Risk screenings as ordered, Perform wound care treatments as ordered. ? ?Evaluation of Outcomes: Not Met ? ? ?LCSW Treatment Plan for Primary Diagnosis: Major depressive disorder, recurrent severe without psychotic features ([employer]) ?Long Term Goal(s): Safe transition to appropriate next level of care at discharge, Engage patient in therapeutic group addressing interpersonal concerns. ? ?Short Term Goals: Engage patient in aftercare planning with referrals and resources, Increase social support, Increase ability to appropriately  verbalize feelings, Increase emotional regulation, Facilitate acceptance of mental health diagnosis and concerns, Facilitate patient progression through stages of change regarding substance use diagnoses and concerns, Identify triggers associated with mental health/substance abuse issues, and Increase skills for wellness and recovery ? ?Therapeutic Interventions: Assess for all discharge needs, 1 to 1 time with Education officer, museum, Explore available resources and support systems, Assess for adequacy in community support network, Educate family and significant other(s) on suicide prevention, Complete Psychosocial Assessment, Interpersonal group therapy. ? ?Evaluation of Outcomes: Not Met ? ? ?Progress in Treatment: ?Attending groups: No. ?Participating in groups: No. ?Taking medication as prescribed: Yes. ?Toleration medication: Yes. ?Family/Significant other contact made: Yes, individual(s) contacted:  SPE completed with [name].  ?Patient understands diagnosis: Yes. ?Discussing patient identified problems/goals with staff: Yes. ?Medical problems stabilized or resolved: Yes. ?Denies suicidal/homicidal ideation: No. ?Issues/concerns per patient self-inventory: Yes. ?Other: none ? ?New problem(s) identified: No, Describe:  No additional problems/concerns identified at this time.  ? ?New Short Term/Long Term Goal(s): Patient to work towards elimination of medication management for mood stabilization; elimination of SI thoughts; development of comprehensive mental wellness plan. ? ?Patient Goals:  No additional goals identified at this time. Patient to continue to work towards original goals identified in initial treatment team meeting. CSW will remain available to patient should they voice additional treatment goals.  ? ?  Discharge Plan or Barriers: No psychosocial barriers identified at this time, patient to return to place of residence when appropriate for discharge.  ? ?Reason for Continuation of Hospitalization:  Depression ?Suicidal ideation ? ?Estimated Length of Stay: 1-7 days  ? ?Last 3 [place] Suicide Severity Risk Score: ?[place] Admission (Current) from 12/31/2021 in [place] [facility] to [facility]

## 2022-01-08 NOTE — Progress Notes (Signed)
D: Pt alert and oriented. Pt rates his anxiety 7/10 and depression 0/10 at this time. Pt denies experiencing any pain at this time. Pt denies experiencing any SI/HI, or AVH at this time.   ?A: Scheduled medications and Trazodone 50 mg po prn for insomnia administered to patient as prescribed. Support and encouragement provided. Routine safety checks conducted q15 minutes.  ?R: No adverse drug reactions noted. Pt verbally contracts for safety at this time. Pt complaint with medications. Pt interacts minimally with others on the unit. Pt remains safe at this time. Will continue to monitor.  ?

## 2022-01-09 DIAGNOSIS — F332 Major depressive disorder, recurrent severe without psychotic features: Secondary | ICD-10-CM | POA: Diagnosis not present

## 2022-01-09 LAB — GLUCOSE, CAPILLARY
Glucose-Capillary: 97 mg/dL (ref 70–99)
Glucose-Capillary: 78 mg/dL (ref 70–99)
Glucose-Capillary: 88 mg/dL (ref 70–99)
Glucose-Capillary: 77 mg/dL (ref 70–99)

## 2022-01-09 MED ORDER — CLONIDINE HCL 0.1 MG PO TABS
0.1000 mg | ORAL_TABLET | Freq: Two times a day (BID) | ORAL | Status: DC | PRN
Start: 2022-01-09 — End: 2022-01-11

## 2022-01-09 NOTE — Progress Notes (Signed)
Patient cooperative and med compliant. Reports no pain. Denies SI, HI, and A/V/H with no plan/intent. Patient states he slept well last night and states his anxiety is decreasing. Patient rated his anxiety a 4 out 10. Patient states he is feeling much better but still verbalized requiring medication for anxiety. Patient received trazadone po prn which provided temporary relief. Patient attended group with encouragement. Pt verbalizes no further concerns at this moment. Patient remains safe on unit.  ? ? 01/09/22 0907  ?Psych Admission Type (Psych Patients Only)  ?Admission Status Involuntary  ?Psychosocial Assessment  ?Patient Complaints Anxiety  ?Eye Contact Fair  ?Facial Expression Flat;Anxious  ?Affect Anxious  ?Speech Logical/coherent  ?Interaction Assertive  ?Motor Activity Other (Comment) ?(appropriate)  ?Appearance/Hygiene Unremarkable  ?Behavior Characteristics Cooperative;Anxious  ?Mood Depressed;Anxious  ?Thought Process  ?Coherency WDL  ?Content WDL  ?Delusions None reported or observed  ?Perception WDL  ?Hallucination None reported or observed  ?Judgment Impaired  ?Confusion None  ?Danger to Self  ?Current suicidal ideation? Denies  ?Agreement Not to Harm Self Yes  ?Description of Agreement Verbal  ?Danger to Others  ?Danger to Others None reported or observed  ? ? ?

## 2022-01-09 NOTE — Progress Notes (Signed)
At the beginning of the shift pt in his room. He got up the eat his snack, and then back to bed. He asked for a prn for anxiety and his score 7/10, but he was asleep right before he asked for the medication. Pt denies SI/HI, and AVH. Pt states his goal is to get better so he can be discharged. His blood glucose is stable. He isolates to his room, minimal integration with peers and staff. ?

## 2022-01-09 NOTE — Plan of Care (Signed)
?  Goal: Verbalization of understanding the information provided will improve ?Outcome: Progressing ?  ?Problem: Education: ?Goal: Ability to state activities that reduce stress will improve ?Outcome: Progressing ?  ?Problem: Self-Concept: ?Goal: Level of anxiety will decrease ?Outcome: Progressing ?  ?

## 2022-01-09 NOTE — Progress Notes (Signed)
[facility] MD Progress Note ? ?01/09/2022 1:16 PM ?Jeremy Shannon  ?MRN:  [id number] ? ?Principal Problem: Major depressive disorder, recurrent severe without psychotic features ([place]) ?Diagnosis: Principal Problem: ?  Major depressive disorder, recurrent severe without psychotic features ([place]) ? ?Patient is a  66y.o. male who presents to the [facility] unit due to worsened depression and anxiety. ? ? ?Interval History ?Patient was seen today for re-evaluation.  Nursing reports no events overnight. The patient has no issues with performing ADLs.  Patient has been medication compliant.   ? ?Subjective:  On assessment patient reports "I feel better. I slept better. My mood is better. My anxiety is lower. Can I continue Clonidine for anxiety? It seem to be working well for me ". Denies feeling depressed. Denies suicidal/homicidal ideation. Denies auditory/visual hallucinations. The patient reports no side effects from medications.   ? ?Labs: no new results for review. ? ?  ? ?Total Time spent with patient: 30 minutes ? ?Past Psychiatric History: see H&P ? ?Past Medical History:  ?Past Medical History:  ?Diagnosis Date  ? Anemia   ? pt denies  ? Anxiety   ? Cataract   ? Depression   ? Diabetes mellitus without complication ([place])   ? type 2  ? Dyspnea   ? Hyperlipidemia   ? PONV (postoperative nausea and vomiting)   ?  ?Past Surgical History:  ?Procedure Laterality Date  ? BRONCHIAL BIOPSY  02/16/2021  ? Procedure: BRONCHIAL BIOPSIES;  Surgeon: Garner Nash, DO;  Location: [place] [facility];  Service: Pulmonary;;  ? BRONCHIAL BRUSHINGS  02/16/2021  ? Procedure: BRONCHIAL BRUSHINGS;  Surgeon: Garner Nash, DO;  Location: [place];  Service: Pulmonary;;  ? BRONCHIAL NEEDLE ASPIRATION BIOPSY  02/16/2021  ? Procedure: BRONCHIAL NEEDLE ASPIRATION BIOPSIES;  Surgeon: Garner Nash, DO;  Location: [place];  Service: Pulmonary;;  ? BRONCHIAL WASHINGS  02/16/2021  ? Procedure: BRONCHIAL WASHINGS;  Surgeon: Garner Nash, DO;   Location: [place] [facility];  Service: Pulmonary;;  ? CATARACT EXTRACTION    ? bilateral  ? CHOLECYSTECTOMY N/A 02/10/2017  ? Procedure: LAPAROSCOPIC CHOLECYSTECTOMY WITH INTRAOPERATIVE CHOLANGIOGRAM;  Surgeon: Alphonsa Overall, MD;  Location: [facility];  Service: General;  Laterality: N/A;  ? COLONOSCOPY    ? FIDUCIAL MARKER PLACEMENT  02/16/2021  ? Procedure: FIDUCIAL MARKER PLACEMENT;  Surgeon: Garner Nash, DO;  Location: [place] [facility];  Service: Pulmonary;;  ? VIDEO BRONCHOSCOPY WITH ENDOBRONCHIAL NAVIGATION Right 02/16/2021  ? Procedure: VIDEO BRONCHOSCOPY WITH ENDOBRONCHIAL NAVIGATION;  Surgeon: Garner Nash, DO;  Location: [place];  Service: Pulmonary;  Laterality: Right;  ? ?Family History:  ?Family History  ?Problem Relation Age of Onset  ? Liver cancer Mother   ? Depression Father   ? Drug abuse Father   ? ?Family Psychiatric  History: see H&P ?Social History:  ?Social History  ? ?Substance and Sexual Activity  ?Alcohol Use No  ?   ?Social History  ? ?Substance and Sexual Activity  ?Drug Use No  ?  ?Social History  ? ?Socioeconomic History  ? Marital status: Married  ?  Spouse name: Not on file  ? Number of children: Not on file  ? Years of education: Not on file  ? Highest education level: Not on file  ?Occupational History  ? Not on file  ?Tobacco Use  ? Smoking status: Every Day  ?  Packs/day: 1.00  ?  Years: 43.00  ?  Pack years: 43.00  ?  Types: Cigarettes  ?  Passive  exposure: Current  ? Smokeless tobacco: Never  ? Tobacco comments:  ?  10-20 cigarettes smoked daily 02/17/2021  ?Vaping Use  ? Vaping Use: Never used  ?Substance and Sexual Activity  ? Alcohol use: No  ? Drug use: No  ? Sexual activity: Yes  ?  Partners: Female  ?  Birth control/protection: None  ?Other Topics Concern  ? Not on file  ?Social History Narrative  ? Patient is currently a smoker and states he is not ready to quit  ? ?Social Determinants of Health  ? ?Financial Resource Strain: Not on file  ?Food Insecurity: Not on file   ?Transportation Needs: Not on file  ?Physical Activity: Not on file  ?Stress: Not on file  ?Social Connections: Not on file  ? ?Additional Social History:  ?  ?  ?  ?  ?  ?  ?  ?  ?  ?  ?  ? ?Sleep: Fair ? ?Appetite:  Fair ? ?Current Medications: ?Current Facility-Administered Medications  ?Medication Dose Route Frequency Provider Last Rate Last Admin  ? acetaminophen (TYLENOL) tablet 650 mg  650 mg Oral Q6H PRN Patrecia Pour, NP      ? alum & mag hydroxide-simeth (MAALOX/MYLANTA) 200-200-20 MG/5ML suspension 30 mL  30 mL Oral Q4H PRN Patrecia Pour, NP      ? cloNIDine (CATAPRES) tablet 0.1 mg  0.1 mg Oral BID PRN Larita Fife, MD      ? divalproex (DEPAKOTE) DR tablet 500 mg  500 mg Oral BH-[id number] Parks Ranger, DO   500 mg at 01/09/22 5277  ? doxepin (SINEQUAN) capsule 150 mg  150 mg Oral QHS Parks Ranger, DO   150 mg at 01/08/22 2157  ? empagliflozin (JARDIANCE) tablet 10 mg  10 mg Oral Daily Patrecia Pour, NP   10 mg at 01/09/22 8242  ? FLUoxetine (PROZAC) capsule 20 mg  20 mg Oral Daily Parks Ranger, DO   20 mg at 01/09/22 3536  ? icosapent Ethyl (VASCEPA) 1 g capsule 1 g  1 g Oral BID Patrecia Pour, NP   1 g at 01/09/22 1443  ? insulin aspart (novoLOG) injection 0-15 Units  0-15 Units Subcutaneous TID WC Patrecia Pour, NP   2 Units at 01/07/22 1628  ? magnesium hydroxide (MILK OF MAGNESIA) suspension 30 mL  30 mL Oral Daily PRN Patrecia Pour, NP      ? nicotine (NICODERM CQ - dosed in mg/24 hours) patch 14 mg  14 mg Transdermal Daily Parks Ranger, DO   14 mg at 01/09/22 1540  ? simvastatin (ZOCOR) tablet 20 mg  20 mg Oral QHS Patrecia Pour, NP   20 mg at 01/08/22 2157  ? tamsulosin (FLOMAX) capsule 0.4 mg  0.4 mg Oral Daily Patrecia Pour, NP   0.4 mg at 01/09/22 0867  ? traZODone (DESYREL) tablet 50 mg  50 mg Oral Q4H PRN Parks Ranger, DO   50 mg at 01/09/22 1229  ? ? ?Lab Results:  ?Results for orders placed or performed during the hospital  encounter of 12/31/21 (from the past 48 hour(s))  ?Glucose, capillary     Status: Abnormal  ? Collection Time: 01/07/22  4:22 PM  ?Result Value Ref Range  ? Glucose-Capillary 142 (H) 70 - 99 mg/dL  ?  Comment: Glucose reference range applies only to samples taken after fasting for at least 8 hours.  ?Glucose, capillary     Status: Abnormal  ?  Collection Time: 01/07/22  9:43 PM  ?Result Value Ref Range  ? Glucose-Capillary 145 (H) 70 - 99 mg/dL  ?  Comment: Glucose reference range applies only to samples taken after fasting for at least 8 hours.  ? Comment 1 Notify RN   ?Glucose, capillary     Status: None  ? Collection Time: 01/08/22  8:13 AM  ?Result Value Ref Range  ? Glucose-Capillary 85 70 - 99 mg/dL  ?  Comment: Glucose reference range applies only to samples taken after fasting for at least 8 hours.  ?Glucose, capillary     Status: None  ? Collection Time: 01/08/22 11:56 AM  ?Result Value Ref Range  ? Glucose-Capillary 97 70 - 99 mg/dL  ?  Comment: Glucose reference range applies only to samples taken after fasting for at least 8 hours.  ?Glucose, capillary     Status: None  ? Collection Time: 01/08/22  4:16 PM  ?Result Value Ref Range  ? Glucose-Capillary 90 70 - 99 mg/dL  ?  Comment: Glucose reference range applies only to samples taken after fasting for at least 8 hours.  ?Glucose, capillary     Status: None  ? Collection Time: 01/08/22 10:31 PM  ?Result Value Ref Range  ? Glucose-Capillary 82 70 - 99 mg/dL  ?  Comment: Glucose reference range applies only to samples taken after fasting for at least 8 hours.  ? Comment 1 Notify RN   ?Glucose, capillary     Status: None  ? Collection Time: 01/09/22  8:07 AM  ?Result Value Ref Range  ? Glucose-Capillary 97 70 - 99 mg/dL  ?  Comment: Glucose reference range applies only to samples taken after fasting for at least 8 hours.  ?Glucose, capillary     Status: None  ? Collection Time: 01/09/22 11:45 AM  ?Result Value Ref Range  ? Glucose-Capillary 78 70 - 99 mg/dL   ?  Comment: Glucose reference range applies only to samples taken after fasting for at least 8 hours.  ? ? ?Blood Alcohol level:  ?Lab Results  ?Component Value Date  ? ETH <10 12/29/2021  ? ETH <10 03/26/2021  ?

## 2022-01-09 NOTE — Plan of Care (Signed)
?  Problem: Education: ?Goal: Knowledge of [place] [facility] Education information/materials will improve ?Outcome: Progressing ?Goal: Emotional status will improve ?Outcome: Progressing ?Goal: Mental status will improve ?Outcome: Progressing ?Goal: Verbalization of understanding the information provided will improve ?Outcome: Progressing ?  ?Problem: Coping: ?Goal: Ability to verbalize frustrations and anger appropriately will improve ?Outcome: Progressing ?  ?Problem: Health Behavior/Discharge Planning: ?Goal: Identification of resources available to assist in meeting health care needs will improve ?Outcome: Progressing ?  ?

## 2022-01-09 NOTE — Group Note (Signed)
LCSW Group Therapy Note ? ?Group Date: 01/09/2022 ?Start Time: 1330 ?End Time: 1430 ? ? ?Type of Therapy and Topic:  Group Therapy - Healthy vs Unhealthy Coping Skills ? ?Participation Level:  Minimal  ? ?Description of Group ?The focus of this group was to determine what unhealthy coping techniques typically are used by group members and what healthy coping techniques would be helpful in coping with various problems. Patients were guided in becoming aware of the differences between healthy and unhealthy coping techniques. Patients were asked to identify 2-3 healthy coping skills they would like to learn to use more effectively. ? ?Therapeutic Goals ?Patients learned that coping is what human beings do all day long to deal with various situations in their lives ?Patients defined and discussed healthy vs unhealthy coping techniques ?Patients identified their preferred coping techniques and identified whether these were healthy or unhealthy ?Patients determined 2-3 healthy coping skills they would like to become more familiar with and use more often. ?Patients provided support and ideas to each other ? ? ?Summary of Patient Progress: Patient was present for the duration of the group session. Patient presented as reserved but participated when prompted. Patient shared [place] and walks 2-3 miles per day. Patient stated "walking the halls has helped me a little bit" while hospitalized. ? ? ?Therapeutic Modalities ?Cognitive Behavioral Therapy ?Motivational Interviewing ? ?Kenna Gilbert Tamaira Ciriello, LCSWA ?01/09/2022  4:30 PM   ?

## 2022-01-10 LAB — GLUCOSE, CAPILLARY
Glucose-Capillary: 82 mg/dL (ref 70–99)
Glucose-Capillary: 77 mg/dL (ref 70–99)
Glucose-Capillary: 105 mg/dL — ABNORMAL HIGH (ref 70–99)
Glucose-Capillary: 106 mg/dL — ABNORMAL HIGH (ref 70–99)
Glucose-Capillary: 95 mg/dL (ref 70–99)

## 2022-01-10 NOTE — Progress Notes (Signed)
Pt isolates to room, minimal interaction with peers or staff. Denies SI/HI and AVH. Pt like to stay in his room and in bed. He comes out to ask for anxiety medication and then goes back to his room. Pt's goal is to go home. He states he is aware of what his problem is, but not want to talk about it. Anxiety level 3/10 and depression score 0/10. ? ?

## 2022-01-10 NOTE — Plan of Care (Signed)
Patient is alert and oriented X4, calm and cooperative during assessment. Denies SI, HI, and AVH. Endorsed anxiety of 5/10 and depression of 6/10. Denies pain or discomfort at this time. Ate meals in the day room among peers and tolerated well. Compliant with due medications. Remain safe on the unit with Q15 minutes safety checks.  ? ?Problem: Education: ?Goal: Knowledge of  General Education information/materials will improve ?Outcome: Progressing ?Goal: Emotional status will improve ?Outcome: Progressing ?Goal: Mental status will improve ?Outcome: Progressing ?Goal: Verbalization of understanding the information provided will improve ?Outcome: Progressing ?  ?Problem: Coping: ?Goal: Ability to verbalize frustrations and anger appropriately will improve ?Outcome: Progressing ?  ?Problem: Health Behavior/Discharge Planning: ?Goal: Identification of resources available to assist in meeting health care needs will improve ?Outcome: Progressing ?  ?Problem: Education: ?Goal: Ability to state activities that reduce stress will improve ?Outcome: Progressing ?  ?Problem: Self-Concept: ?Goal: Level of anxiety will decrease ?Outcome: Progressing ?  ?

## 2022-01-10 NOTE — BHH Counselor (Signed)
CSW spoke with pt's wife, Jeremy Shannon, [phone] regarding discharge planning. She stated that she wanted pt to receive referral from another facility for therapy and said she had an interest in [facility] outpatient or [facility] for referral. CSW will follow up with the following places and set outpatient appointment.  ? ?She stated she could provide transportation for pt at discharge but would not be able to pick up up until the evening on Mondays- Friday.  ? ?No other requests were made. Conversation ended without incident.  ? ?Shawnna Pancake [place], MSW, LCSW-A ?5/1/20233:22 PM  ?

## 2022-01-10 NOTE — Plan of Care (Signed)
Patient remains alert, calm and cooperative during assessment. Denies SI, HI, and AVH. Denies depression endorsed anxiety of 6/10. Ate meals in the day room among peers and tolerated well. Remain safe on the unit with Q15 minute safety checks. ? ?Problem: Education: ?Goal: Knowledge of [place] [facility] Education information/materials will improve ?01/10/2022 1244 by Said Rueb, RN ?Outcome: Progressing ?01/10/2022 1154 by Yeilin Zweber, RN ?Outcome: Progressing ?Goal: Emotional status will improve ?01/10/2022 1244 by Fianna Snowball, RN ?Outcome: Progressing ?01/10/2022 1154 by Damyiah Moxley, RN ?Outcome: Progressing ?Goal: Mental status will improve ?01/10/2022 1244 by Yeimi Debnam, RN ?Outcome: Progressing ?01/10/2022 1154 by Diondra Pines, RN ?Outcome: Progressing ?Goal: Verbalization of understanding the information provided will improve ?01/10/2022 1244 by Linnae Rasool, RN ?Outcome: Progressing ?01/10/2022 1154 by Laderrick Wilk, RN ?Outcome: Progressing ?  ?Problem: Coping: ?Goal: Ability to verbalize frustrations and anger appropriately will improve ?01/10/2022 1244 by Ovid Witman, RN ?Outcome: Progressing ?01/10/2022 1154 by Jamaine Quintin, RN ?Outcome: Progressing ?  ?Problem: Health Behavior/Discharge Planning: ?Goal: Identification of resources available to assist in meeting health care needs will improve ?01/10/2022 1244 by Andrius Andrepont, RN ?Outcome: Progressing ?01/10/2022 1154 by Reesha Debes, RN ?Outcome: Progressing ?  ?Problem: Education: ?Goal: Ability to state activities that reduce stress will improve ?01/10/2022 1244 by Orlo Brickle, RN ?Outcome: Progressing ?01/10/2022 1154 by Georgette Helmer, RN ?Outcome: Progressing ?  ?Problem: Self-Concept: ?Goal: Level of anxiety will decrease ?01/10/2022 1244 by Lorilyn Laitinen, RN ?Outcome: Progressing ?01/10/2022 1154 by Unique Sillas, RN ?Outcome: Progressing ?  ?

## 2022-01-10 NOTE — Progress Notes (Signed)
Pt isolates to hs room and in bed, most of the time. He defines SI/Hi, plus AVH. He stated his goal is the go home. No interaction with peers and no complaints voiced. Blood sugar 72 and no insulin was treatment needed. Pt declined his snack. ?

## 2022-01-10 NOTE — Progress Notes (Signed)
[facility] MD Progress Note ? ?01/10/2022 1:38 PM ?Harlie Ragle Gut  ?MRN:  [id number] ?Subjective: Jeremy Shannon is seen on rounds.  On-call doctor started him on clonidine as needed for anxiety.  He thinks that it does help a little bit.  He is also taking trazodone as needed for anxiety.  His medications have been adjusted.  It sounds like he is sleeping better.  He states that he feels better but not sure if he is just saying this to go home.  Hopefully, his wife will come in and I can talk to her on what she thinks.  I did inform them that the medications do take time and that having a therapist will be very helpful for his anxiety since it was controlled by Xanax for 20 years.  He denies any suicidal ideation. ? ?Principal Problem: Major depressive disorder, recurrent severe without psychotic features ([place]) ?Diagnosis: Principal Problem: ?  Major depressive disorder, recurrent severe without psychotic features ([place]) ? ?Total Time spent with patient: 15 minutes ? 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  He has been on Rexulti.  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  States he goes to [facility] for the past year.  There is no inpatient admissions noted. ? ?Past Medical History:  ?Past Medical History:  ?Diagnosis Date  ? Anemia   ? pt denies  ? Anxiety   ? Cataract   ? Depression   ? Diabetes mellitus without complication ([place])   ? type 2  ? Dyspnea   ? Hyperlipidemia   ? PONV (postoperative nausea and vomiting)   ?  ?Past Surgical History:  ?Procedure Laterality Date  ? BRONCHIAL BIOPSY  02/16/2021  ? Procedure: BRONCHIAL BIOPSIES;  Surgeon: Garner Nash, DO;  Location: [place] [facility];  Service: Pulmonary;;  ? BRONCHIAL BRUSHINGS  02/16/2021  ? Procedure: BRONCHIAL BRUSHINGS;  Surgeon: Garner Nash, DO;  Location: [place];  Service: Pulmonary;;  ? BRONCHIAL NEEDLE ASPIRATION BIOPSY  02/16/2021  ? Procedure:  BRONCHIAL NEEDLE ASPIRATION BIOPSIES;  Surgeon: Garner Nash, DO;  Location: [place];  Service: Pulmonary;;  ? BRONCHIAL WASHINGS  02/16/2021  ? Procedure: BRONCHIAL WASHINGS;  Surgeon: Garner Nash, DO;  Location: [place] [facility];  Service: Pulmonary;;  ? CATARACT EXTRACTION    ? bilateral  ? CHOLECYSTECTOMY N/A 02/10/2017  ? Procedure: LAPAROSCOPIC CHOLECYSTECTOMY WITH INTRAOPERATIVE CHOLANGIOGRAM;  Surgeon: Alphonsa Overall, MD;  Location[facility];  Service: General;  Laterality: N/A;  ? COLONOSCOPY    ? FIDUCIAL MARKER PLACEMENT  02/16/2021  ? Procedure: FIDUCIAL MARKER PLACEMENT;  Surgeon: Garner Nash, DO;  Location: [place] [facility];  Service: Pulmonary;;  ? VIDEO BRONCHOSCOPY WITH ENDOBRONCHIAL NAVIGATION Right 02/16/2021  ? Procedure: VIDEO BRONCHOSCOPY WITH ENDOBRONCHIAL NAVIGATION;  Surgeon: Garner Nash, DO;  Location: [place];  Service: Pulmonary;  Laterality: Right;  ? ?Family History:  ?Family History  ?Problem Relation Age of Onset  ? Liver cancer Mother   ? Depression Father   ? Drug abuse Father   ? ? ?Social History:  ?Social History  ? ?Substance and Sexual Activity  ?Alcohol Use No  ?   ?Social History  ? ?Substance and Sexual Activity  ?Drug Use No  ?  ?Social History  ? ?Socioeconomic History  ? Marital status: Married  ?  Spouse name: Not on file  ? Number of children: Not on file  ? Years of education: Not on file  ?  Highest education level: Not on file  ?Occupational History  ? Not on file  ?Tobacco Use  ? Smoking status: Every Day  ?  Packs/day: 1.00  ?  Years: 43.00  ?  Pack years: 43.00  ?  Types: Cigarettes  ?  Passive exposure: Current  ? Smokeless tobacco: Never  ? Tobacco comments:  ?  10-20 cigarettes smoked daily 02/17/2021  ?Vaping Use  ? Vaping Use: Never used  ?Substance and Sexual Activity  ? Alcohol use: No  ? Drug use: No  ? Sexual activity: Yes  ?  Partners: Female  ?  Birth control/protection: None  ?Other Topics Concern  ? Not on file  ?Social History Narrative  ?  Patient is currently a smoker and states he is not ready to quit  ? ?Social Determinants of Health  ? ?Financial Resource Strain: Not on file  ?Food Insecurity: Not on file  ?Transportation Needs: Not on file  ?Physical Activity: Not on file  ?Stress: Not on file  ?Social Connections: Not on file  ? ?Additional Social History:  ?  ?  ?  ?  ?  ?  ?  ?  ?  ?  ?  ? ?Sleep: Fair ? ?Appetite:  Good ? ?Current Medications: ?Current Facility-Administered Medications  ?Medication Dose Route Frequency Provider Last Rate Last Admin  ? acetaminophen (TYLENOL) tablet 650 mg  650 mg Oral Q6H PRN Patrecia Pour, NP      ? alum & mag hydroxide-simeth (MAALOX/MYLANTA) 200-200-20 MG/5ML suspension 30 mL  30 mL Oral Q4H PRN Patrecia Pour, NP      ? cloNIDine (CATAPRES) tablet 0.1 mg  0.1 mg Oral BID PRN Larita Fife, MD      ? divalproex (DEPAKOTE) DR tablet 500 mg  500 mg Oral BH-[id number] Parks Ranger, DO   500 mg at 01/10/22 0825  ? doxepin (SINEQUAN) capsule 150 mg  150 mg Oral QHS Parks Ranger, DO   150 mg at 01/09/22 2124  ? empagliflozin (JARDIANCE) tablet 10 mg  10 mg Oral Daily Patrecia Pour, NP   10 mg at 01/10/22 1478  ? FLUoxetine (PROZAC) capsule 20 mg  20 mg Oral Daily Parks Ranger, DO   20 mg at 01/10/22 2956  ? icosapent Ethyl (VASCEPA) 1 g capsule 1 g  1 g Oral BID Patrecia Pour, NP   1 g at 01/10/22 2130  ? insulin aspart (novoLOG) injection 0-15 Units  0-15 Units Subcutaneous TID WC Patrecia Pour, NP   2 Units at 01/07/22 1628  ? magnesium hydroxide (MILK OF MAGNESIA) suspension 30 mL  30 mL Oral Daily PRN Patrecia Pour, NP      ? nicotine (NICODERM CQ - dosed in mg/24 hours) patch 14 mg  14 mg Transdermal Daily Parks Ranger, DO   14 mg at 01/10/22 8657  ? simvastatin (ZOCOR) tablet 20 mg  20 mg Oral QHS Patrecia Pour, NP   20 mg at 01/09/22 2125  ? tamsulosin (FLOMAX) capsule 0.4 mg  0.4 mg Oral Daily Patrecia Pour, NP   0.4 mg at 01/10/22 8469  ? traZODone  (DESYREL) tablet 50 mg  50 mg Oral Q4H PRN Parks Ranger, DO   50 mg at 01/09/22 2131  ? ? ?Lab Results:  ?Results for orders placed or performed during the hospital encounter of 12/31/21 (from the past 48 hour(s))  ?Glucose, capillary     Status: None  ? Collection  Time: 01/08/22  4:16 PM  ?Result Value Ref Range  ? Glucose-Capillary 90 70 - 99 mg/dL  ?  Comment: Glucose reference range applies only to samples taken after fasting for at least 8 hours.  ?Glucose, capillary     Status: None  ? Collection Time: 01/08/22 10:31 PM  ?Result Value Ref Range  ? Glucose-Capillary 82 70 - 99 mg/dL  ?  Comment: Glucose reference range applies only to samples taken after fasting for at least 8 hours.  ? Comment 1 Notify RN   ?Glucose, capillary     Status: None  ? Collection Time: 01/09/22  8:07 AM  ?Result Value Ref Range  ? Glucose-Capillary 97 70 - 99 mg/dL  ?  Comment: Glucose reference range applies only to samples taken after fasting for at least 8 hours.  ?Glucose, capillary     Status: None  ? Collection Time: 01/09/22 11:45 AM  ?Result Value Ref Range  ? Glucose-Capillary 78 70 - 99 mg/dL  ?  Comment: Glucose reference range applies only to samples taken after fasting for at least 8 hours.  ?Glucose, capillary     Status: None  ? Collection Time: 01/09/22  4:15 PM  ?Result Value Ref Range  ? Glucose-Capillary 88 70 - 99 mg/dL  ?  Comment: Glucose reference range applies only to samples taken after fasting for at least 8 hours.  ?Glucose, capillary     Status: None  ? Collection Time: 01/09/22  8:07 PM  ?Result Value Ref Range  ? Glucose-Capillary 77 70 - 99 mg/dL  ?  Comment: Glucose reference range applies only to samples taken after fasting for at least 8 hours.  ?Glucose, capillary     Status: None  ? Collection Time: 01/10/22  7:52 AM  ?Result Value Ref Range  ? Glucose-Capillary 82 70 - 99 mg/dL  ?  Comment: Glucose reference range applies only to samples taken after fasting for at least 8 hours.   ?Glucose, capillary     Status: None  ? Collection Time: 01/10/22 11:37 AM  ?Result Value Ref Range  ? Glucose-Capillary 77 70 - 99 mg/dL  ?  Comment: Glucose reference range applies only to samples taken after fast

## 2022-01-11 ENCOUNTER — Other Ambulatory Visit: Payer: Self-pay | Admitting: Internal Medicine

## 2022-01-11 DIAGNOSIS — E781 Pure hyperglyceridemia: Secondary | ICD-10-CM

## 2022-01-11 LAB — GLUCOSE, CAPILLARY
Glucose-Capillary: 93 mg/dL (ref 70–99)
Glucose-Capillary: 68 mg/dL — ABNORMAL LOW (ref 70–99)
Glucose-Capillary: 157 mg/dL — ABNORMAL HIGH (ref 70–99)
Glucose-Capillary: 107 mg/dL — ABNORMAL HIGH (ref 70–99)

## 2022-01-11 MED ORDER — TRAZODONE HCL 50 MG PO TABS
50.0000 mg | ORAL_TABLET | Freq: Three times a day (TID) | ORAL | Status: DC | PRN
  Administered 2022-01-11 – 2022-01-12 (×3): 50 mg via ORAL
  Filled 2022-01-11 (×3): qty 1

## 2022-01-11 NOTE — Plan of Care (Signed)
Patient is alert and oriented, calm and cooperative during assessment. Denies SI, HI, and AVH. Endorsed anxiety of 3/10 denies depression. Denies pain or discomfort at this time. Ate meals in the day room among peers and tolerated well. Compliant with due medications. Patient is currently in his room resting in no apparent distress.  ? ?Problem: Education: ?Goal: Knowledge of [facility] Education information/materials will improve ?Outcome: Progressing ?Goal: Emotional status will improve ?Outcome: Progressing ?Goal: Mental status will improve ?Outcome: Progressing ?Goal: Verbalization of understanding the information provided will improve ?Outcome: Progressing ?  ?Problem: Coping: ?Goal: Ability to verbalize frustrations and anger appropriately will improve ?Outcome: Progressing ?  ?Problem: Health Behavior/Discharge Planning: ?Goal: Identification of resources available to assist in meeting health care needs will improve ?Outcome: Progressing ?  ?Problem: Education: ?Goal: Ability to state activities that reduce stress will improve ?Outcome: Progressing ?  ?Problem: Self-Concept: ?Goal: Level of anxiety will decrease ?Outcome: Progressing ?  ?

## 2022-01-11 NOTE — BHH Counselor (Signed)
CSW contacted pt's wife, [name], [phone] regarding discharge planning. CSW informed pt that pt would be discharging tomorrow. She stated that she would be available to provide transportation around 7pm for pick up since she is coming from [facility].  ? ?Prarthana Parlin [place], MSW, LCSW-A ?5/2/20233:02 PM  ?

## 2022-01-11 NOTE — Group Note (Signed)
[place] LCSW Group Therapy Note ? ? ?Group Date: 01/11/2022 ?Start Time: 2263 ?End Time: 1615 ? ? ?Type of Therapy/Topic:  Group Therapy:  Emotion Regulation ? ?Participation Level:  Did Not Attend  ? ? ? ?Description of Group:   ? The purpose of this group is to assist patients in learning to regulate negative emotions and experience positive emotions. Patients will be guided to discuss ways in which they have been vulnerable to their negative emotions. These vulnerabilities will be juxtaposed with experiences of positive emotions or situations, and patients challenged to use positive emotions to combat negative ones. Special emphasis will be placed on coping with negative emotions in conflict situations, and patients will process healthy conflict resolution skills. ? ?Therapeutic Goals: ?Patient will identify two positive emotions or experiences to reflect on in order to balance out negative emotions:  ?Patient will label two or more emotions that they find the most difficult to experience:  ?Patient will be able to demonstrate positive conflict resolution skills through discussion or role plays:  ? ?Summary of Patient Progress: ? ? ?X ? ? ? ?Therapeutic Modalities:   ?Cognitive Behavioral Therapy ?Feelings Identification ?Dialectical Behavioral Therapy ? ? ?Allysen Lazo A [place], LCSWA ?

## 2022-01-11 NOTE — BHH Group Notes (Signed)
Group: outdoor courtyard PT REFUSED  ?

## 2022-01-11 NOTE — Progress Notes (Signed)
[facility] MD Progress Note ? ?01/11/2022 10:23 AM ?Jeremy Shannon  ?MRN:  [id number] ?Subjective: Jeremy Shannon states that he is doing better.  He states he slept well.  His anxiety is better controlled and he looks like he feels better.  The doxepin has been very helpful to him.  He denies any side effects from his medications.  We talked about his clonidine but he is not able to take it somewhere and discontinue it.  He states that the trazodone as needed helps.  He is willing to see a therapist which I think will help with his anxiety since he relies on medications so much.  Social work is going to discuss discharge with his wife.  Yanuel denies any suicidal ideation.  He regrets his previous attempts. ? ?Principal Problem: Major depressive disorder, recurrent severe without psychotic features ([place]) ?Diagnosis: Principal Problem: ?  Major depressive disorder, recurrent severe without psychotic features ([place]) ? ?Total Time spent with patient: 15 minutes ? 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  He has been on Rexulti.  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  States [facility] to [facility].  There is no inpatient admissions noted. ? ?Past Medical History:  ?Past Medical History:  ?Diagnosis Date  ? Anemia   ? pt denies  ? Anxiety   ? Cataract   ? Depression   ? Diabetes mellitus without complication ([place])   ? type 2  ? Dyspnea   ? Hyperlipidemia   ? PONV (postoperative nausea and vomiting)   ?  ?Past Surgical History:  ?Procedure Laterality Date  ? BRONCHIAL BIOPSY  02/16/2021  ? Procedure: BRONCHIAL BIOPSIES;  Surgeon: Garner Nash, DO;  Location: [place] [facility];  Service: Pulmonary;;  ? BRONCHIAL BRUSHINGS  02/16/2021  ? Procedure: BRONCHIAL BRUSHINGS;  Surgeon: Garner Nash, DO;  Location: [place];  Service: Pulmonary;;  ? BRONCHIAL NEEDLE ASPIRATION BIOPSY  02/16/2021  ? Procedure: BRONCHIAL  NEEDLE ASPIRATION BIOPSIES;  Surgeon: Garner Nash, DO;  Location: [place];  Service: Pulmonary;;  ? BRONCHIAL WASHINGS  02/16/2021  ? Procedure: BRONCHIAL WASHINGS;  Surgeon: Garner Nash, DO;  Location: [place] [facility];  Service: Pulmonary;;  ? CATARACT EXTRACTION    ? bilateral  ? CHOLECYSTECTOMY N/A 02/10/2017  ? Procedure: LAPAROSCOPIC CHOLECYSTECTOMY WITH INTRAOPERATIVE CHOLANGIOGRAM;  Surgeon: Alphonsa Overall, MD;  Location: [facility];  Service: General;  Laterality: N/A;  ? COLONOSCOPY    ? FIDUCIAL MARKER PLACEMENT  02/16/2021  ? Procedure: FIDUCIAL MARKER PLACEMENT;  Surgeon: Garner Nash, DO;  Location: [facility];  Service: Pulmonary;;  ? VIDEO BRONCHOSCOPY WITH ENDOBRONCHIAL NAVIGATION Right 02/16/2021  ? Procedure: VIDEO BRONCHOSCOPY WITH ENDOBRONCHIAL NAVIGATION;  Surgeon: Garner Nash, DO;  Location: [place];  Service: Pulmonary;  Laterality: Right;  ? ?Family History:  ?Family History  ?Problem Relation Age of Onset  ? Liver cancer Mother   ? Depression Father   ? Drug abuse Father   ? ? ?Social History:  ?Social History  ? ?Substance and Sexual Activity  ?Alcohol Use No  ?   ?Social History  ? ?Substance and Sexual Activity  ?Drug Use No  ?  ?Social History  ? ?Socioeconomic History  ? Marital status: Married  ?  Spouse name: Not on file  ? Number of children: Not on file  ? Years of education: Not on file  ? Highest education level: Not on  file  ?Occupational History  ? Not on file  ?Tobacco Use  ? Smoking status: Every Day  ?  Packs/day: 1.00  ?  Years: 43.00  ?  Pack years: 43.00  ?  Types: Cigarettes  ?  Passive exposure: Current  ? Smokeless tobacco: Never  ? Tobacco comments:  ?  10-20 cigarettes smoked daily 02/17/2021  ?Vaping Use  ? Vaping Use: Never used  ?Substance and Sexual Activity  ? Alcohol use: No  ? Drug use: No  ? Sexual activity: Yes  ?  Partners: Female  ?  Birth control/protection: None  ?Other Topics Concern  ? Not on file  ?Social History Narrative  ? Patient is  currently a smoker and states he is not ready to quit  ? ?Social Determinants of Health  ? ?Financial Resource Strain: Not on file  ?Food Insecurity: Not on file  ?Transportation Needs: Not on file  ?Physical Activity: Not on file  ?Stress: Not on file  ?Social Connections: Not on file  ? ?Additional Social History:  ?  ?  ?  ?  ?  ?  ?  ?  ?  ?  ?  ? ?Sleep: Good ? ?Appetite:  Good ? ?Current Medications: ?Current Facility-Administered Medications  ?Medication Dose Route Frequency Provider Last Rate Last Admin  ? acetaminophen (TYLENOL) tablet 650 mg  650 mg Oral Q6H PRN Patrecia Pour, NP      ? alum & mag hydroxide-simeth (MAALOX/MYLANTA) 200-200-20 MG/5ML suspension 30 mL  30 mL Oral Q4H PRN Patrecia Pour, NP      ? divalproex (DEPAKOTE) DR tablet 500 mg  500 mg Oral [id number] Parks Ranger, DO   500 mg at 01/11/22 2725  ? doxepin (SINEQUAN) capsule 150 mg  150 mg Oral QHS Parks Ranger, DO   150 mg at 01/10/22 2137  ? empagliflozin (JARDIANCE) tablet 10 mg  10 mg Oral Daily Patrecia Pour, NP   10 mg at 01/11/22 1007  ? FLUoxetine (PROZAC) capsule 20 mg  20 mg Oral Daily Parks Ranger, DO   20 mg at 01/11/22 1006  ? icosapent Ethyl (VASCEPA) 1 g capsule 1 g  1 g Oral BID Patrecia Pour, NP   1 g at 01/11/22 1007  ? insulin aspart (novoLOG) injection 0-15 Units  0-15 Units Subcutaneous TID WC Patrecia Pour, NP   2 Units at 01/07/22 1628  ? magnesium hydroxide (MILK OF MAGNESIA) suspension 30 mL  30 mL Oral Daily PRN Patrecia Pour, NP      ? nicotine (NICODERM CQ - dosed in mg/24 hours) patch 14 mg  14 mg Transdermal Daily Parks Ranger, DO   14 mg at 01/11/22 1011  ? simvastatin (ZOCOR) tablet 20 mg  20 mg Oral QHS Patrecia Pour, NP   20 mg at 01/10/22 2137  ? tamsulosin (FLOMAX) capsule 0.4 mg  0.4 mg Oral Daily Patrecia Pour, NP   0.4 mg at 01/11/22 1006  ? traZODone (DESYREL) tablet 50 mg  50 mg Oral TID PRN Parks Ranger, DO      ? ? ?Lab Results:   ?Results for orders placed or performed during the hospital encounter of 12/31/21 (from the past 48 hour(s))  ?Glucose, capillary     Status: None  ? Collection Time: 01/09/22 11:45 AM  ?Result Value Ref Range  ? Glucose-Capillary 78 70 - 99 mg/dL  ?  Comment: Glucose reference range applies only to samples  taken after fasting for at least 8 hours.  ?Glucose, capillary     Status: None  ? Collection Time: 01/09/22  4:15 PM  ?Result Value Ref Range  ? Glucose-Capillary 88 70 - 99 mg/dL  ?  Comment: Glucose reference range applies only to samples taken after fasting for at least 8 hours.  ?Glucose, capillary     Status: None  ? Collection Time: 01/09/22  8:07 PM  ?Result Value Ref Range  ? Glucose-Capillary 77 70 - 99 mg/dL  ?  Comment: Glucose reference range applies only to samples taken after fasting for at least 8 hours.  ?Glucose, capillary     Status: None  ? Collection Time: 01/10/22  7:52 AM  ?Result Value Ref Range  ? Glucose-Capillary 82 70 - 99 mg/dL  ?  Comment: Glucose reference range applies only to samples taken after fasting for at least 8 hours.  ?Glucose, capillary     Status: None  ? Collection Time: 01/10/22 11:37 AM  ?Result Value Ref Range  ? Glucose-Capillary 77 70 - 99 mg/dL  ?  Comment: Glucose reference range applies only to samples taken after fasting for at least 8 hours.  ?Glucose, capillary     Status: Abnormal  ? Collection Time: 01/10/22  4:42 PM  ?Result Value Ref Range  ? Glucose-Capillary 105 (H) 70 - 99 mg/dL  ?  Comment: Glucose reference range applies only to samples taken after fasting for at least 8 hours.  ?Glucose, capillary     Status: Abnormal  ? Collection Time: 01/10/22  8:03 PM  ?Result Value Ref Range  ? Glucose-Capillary 106 (H) 70 - 99 mg/dL  ?  Comment: Glucose reference range applies only to samples taken after fasting for at least 8 hours.  ?Glucose, capillary     Status: None  ? Collection Time: 01/10/22 10:08 PM  ?Result Value Ref Range  ? Glucose-Capillary 95 70  - 99 mg/dL  ?  Comment: Glucose reference range applies only to samples taken after fasting for at least 8 hours.  ? Comment 1 Notify RN   ?Glucose, capillary     Status: None  ? Collection Time: 05/02/

## 2022-01-11 NOTE — Progress Notes (Signed)
Recreation Therapy Notes ? ?Date: 01/11/2022 ?  ?Time: 1:20pm ?  ?Location: Craft room  ?  ?Behavioral response: N/A ?  ?Intervention Topic: Self-care  ?  ?Discussion/Intervention: ?Patient refused to attend group.  ?  ?Clinical Observations/Feedback:  ?Patient refused to attend group.  ?  ?Kaisa Wofford LRT/CTRS ? ? ? ? ? ? ? ?Brayley Mackowiak ?01/11/2022 2:50 PM ?

## 2022-01-12 LAB — GLUCOSE, CAPILLARY
Glucose-Capillary: 98 mg/dL (ref 70–99)
Glucose-Capillary: 98 mg/dL (ref 70–99)
Glucose-Capillary: 88 mg/dL (ref 70–99)

## 2022-01-12 MED ORDER — FLUOXETINE HCL 20 MG PO CAPS
20.0000 mg | ORAL_CAPSULE | Freq: Every day | ORAL | 3 refills | Status: DC
Start: 2022-01-13 — End: 2022-04-26

## 2022-01-12 MED ORDER — NICOTINE 14 MG/24HR TD PT24: 14.0000 mg | MEDICATED_PATCH | Freq: Every day | TRANSDERMAL | 0 refills | Status: DC

## 2022-01-12 MED ORDER — DIVALPROEX SODIUM 500 MG PO DR TAB: 500.0000 mg | DELAYED_RELEASE_TABLET | ORAL | 3 refills | Status: DC

## 2022-01-12 MED ORDER — DOXEPIN HCL 150 MG PO CAPS: 150.0000 mg | ORAL_CAPSULE | Freq: Every day | ORAL | 3 refills | Status: DC

## 2022-01-12 MED ORDER — TRAZODONE HCL 50 MG PO TABS: 50.0000 mg | ORAL_TABLET | Freq: Three times a day (TID) | ORAL | 3 refills | Status: DC | PRN

## 2022-01-12 NOTE — Progress Notes (Signed)
Recreation Therapy Notes ? ?  ?Date: 01/12/2022 ?  ?Time: 1:25pm  ?  ?Location: Craft room  ?  ?Behavioral response: N/A ?  ?Intervention Topic: Goals  ?  ?Discussion/Intervention: ?Patient refused to attend group.  ?  ?Clinical Observations/Feedback:  ?Patient refused to attend group.  ?  ?Jeremy Shannon LRT/CTRS ?  ? ? ? ? ? ? ? ?Jeremy Shannon ?01/12/2022 2:36 PM ?

## 2022-01-12 NOTE — Progress Notes (Signed)
Patient alert and oriented x 4.  Affect is pleasant and calm. Denies depression but endorsed anxiety of 3/10. Denies SI/HI or AVH. Compliant with due medications. Ate meals in the day room among peers and tolerated well.  Patient states he will try to keep himself safe when he return home. ? ?Reviewed discharge instructions with patient including follow up appointment with provider, medication and prescriptions. Questions answered and understanding verbalized.  Discharge packet given.  All belongings returned to patient after verification completed by staff.   ? ?Patient escorted by staff off unit at this time stable without complaint. ?

## 2022-01-12 NOTE — BHH Group Notes (Addendum)
[facility] Group Notes:  (Nursing/MHT/Case Management/Adjunct) ? ?Date:  01/12/2022  ?Time:  12:55 PM ? ?Type of Therapy:  Psychoeducational Skills ? ?Participation Level:  Did Not Attend ? ?Participation Quality:   N/A ? ?Affect:   N/A ? ?Cognitive:   N/A ? ?Insight:  None ? ?Engagement in Group:   N/A ? ?Modes of Intervention:   N/A ? ?Summary of Progress/Problems: ?The pt did not attend. ?Juliette Alcide ?01/12/2022, 12:55 PM ?

## 2022-01-12 NOTE — BHH Suicide Risk Assessment (Signed)
[facility] Discharge Suicide Risk Assessment ? ? ?Principal Problem: Major depressive disorder, recurrent severe without psychotic features ([place]) ?Discharge Diagnoses: Principal Problem: ?  Major depressive disorder, recurrent severe without psychotic features ([place]) ? ? ?Total Time spent with patient: 1 hour ? ?Musculoskeletal: ?Strength & Muscle Tone: within normal limits ?Gait & Station: normal ?Patient leans: N/A ? ?Psychiatric Specialty Exam ? ?Presentation  ?General Appearance: Appropriate for Environment; Disheveled ? ?Eye Contact:Fair ? ?Speech:Clear and Coherent ? ?Speech Volume:Normal ? ?Handedness:Right ? ? ?Mood and Affect  ?Mood:Anxious ? ?Duration of Depression Symptoms: No data recorded ?Affect:Appropriate; Congruent ? ? ?Thought Process  ?Thought Processes:Coherent; Linear ? ?Descriptions of Associations:Intact ? ?Orientation:Full (Time, Place and Person) ? ?Thought Content:Logical ? ?History of Schizophrenia/Schizoaffective disorder:No data recorded ?Duration of Psychotic Symptoms:No data recorded ?Hallucinations:No data recorded ?Ideas of Reference:None ? ?Suicidal Thoughts:No data recorded ?Homicidal Thoughts:No data recorded ? ?Sensorium  ?Memory:Immediate Good; Recent Fair; Remote Fair ? ?Judgment:Fair ? ?Insight:Fair ? ? ?Executive Functions  ?Concentration:Fair ? ?Attention Span:Fair ? ?Recall:Fair ? ?[place] ? ?Language:Fair ? ? ?Psychomotor Activity  ?Psychomotor Activity:No data recorded ? ?Assets  ?Assets:Communication Skills; Desire for Improvement; Financial Resources/Insurance; Housing; Intimacy; Physical Health; Resilience; Social Support ? ? ?Sleep  ?Sleep:No data recorded ? ?Physical Exam: ?Physical Exam ?Vitals and nursing note reviewed.  ?Constitutional:   ?   Appearance: Normal appearance. He is normal weight.  ?Neurological:  ?   General: No focal deficit present.  ?   Mental Status: He is alert and oriented to person, place, and time.  ?Psychiatric:     ?   Attention and  Perception: Attention and perception normal.     ?   Mood and Affect: Affect normal. Mood is anxious.     ?   Speech: Speech normal.     ?   Behavior: Behavior normal. Behavior is cooperative.     ?   Thought Content: Thought content normal.     ?   Cognition and Memory: Cognition and memory normal.     ?   Judgment: Judgment normal.  ? ?Review of Systems  ?Constitutional: Negative.   ?HENT: Negative.    ?Eyes: Negative.   ?Respiratory: Negative.    ?Cardiovascular: Negative.   ?Gastrointestinal: Negative.   ?Genitourinary: Negative.   ?Musculoskeletal: Negative.   ?Skin: Negative.   ?Neurological: Negative.   ?Endo/Heme/Allergies: Negative.   ?Psychiatric/Behavioral:  The patient is nervous/anxious.   ?Blood pressure 109/73, pulse 83, temperature 98.4 ?F (36.9 ?C), temperature source Oral, resp. rate 20, height '5\' 8"'$  (1.727 m), weight 89.4 kg, SpO2 100 %. Body mass index is 29.95 kg/m?. ? ?Mental Status Per Nursing Assessment::   ?On Admission:  NA ? ?Demographic Factors:  ?Male, Age 68 or older, and Caucasian ? ?Loss Factors: ?NA ? ?Historical Factors: ?Impulsivity ? ?Risk Reduction Factors:   ?Sense of responsibility to family, Living with another person, especially a relative, and Positive social support ? ?Continued Clinical Symptoms:  ?Severe Anxiety and/or Agitation ? ?Cognitive Features That Contribute To Risk:  ?Thought constriction (tunnel vision)   ? ?Suicide Risk:  ?Mild:  Suicidal ideation of limited frequency, intensity, duration, and specificity.  There are no identifiable plans, no associated intent, mild dysphoria and related symptoms, good self-control (both objective and subjective assessment), few other risk factors, and identifiable protective factors, including available and accessible social support. ? ? Follow-up Information   ? ? [facility] Follow up on 01/19/2022.   ?Why: You have an appointment scheduled for follow up with  your provider Eino Farber. She will follow up with  you about scheduling your appointment with a therapist at the practice. Thanks! ?Contact information: ?Address: [address], [place], [place] [address] ?Phone: [phone] ? ?  ?  ? ? [facility] Follow up.   ?Specialty: Family Medicine ?Why: You have been put on a waitlist for therapy with behavioral medicine at [facility]. They will contact you when they have an opening for therapy. Thanks! ?Contact information: ?[place] ?[place] ?[phone] ? ?  ?  ? ?  ?  ? ?  ? ? ?Plan Of Care/Follow-up recommendations: Dr. Reddy's office ? ? ?Parks Ranger, DO ?01/12/2022, 10:30 AM ?

## 2022-01-12 NOTE — Progress Notes (Signed)
Inpatient Diabetes Program Recommendations ? ?AACE/ADA: New Consensus Statement on Inpatient Glycemic Control ? ?Target Ranges:  Prepandial:   less than 140 mg/dL ?     Peak postprandial:   less than 180 mg/dL (1-2 hours) ?     Critically ill patients:  140 - 180 mg/dL  ? ? Latest Reference Range & Units 01/11/22 08:03 01/11/22 12:13 01/11/22 16:25 01/11/22 21:58  ?Glucose-Capillary 70 - 99 mg/dL 93 68 (L) 157 (H) 107 (H)  ? ?Review of Glycemic Control ? ?Diabetes history: DM2 ?Outpatient Diabetes medications: Jardiance 10 mg daily ?Current orders for Inpatient glycemic control: Jardiance 10 mg daily, Novolog 0-15 units TID with meals ? ?Inpatient Diabetes Program Recommendations:   ? ?Oral DM medication: Noted glucose 68 mg/dl at 12:13 on 01/11/22.  May want to consider discontinuing Jardiance if patient has any other issues with hypoglycemia. ? ?Thanks, ?Barnie Alderman, RN, MSN, CDE ?Diabetes Coordinator ?Inpatient Diabetes Program ?[phone] (Team Pager from 8am to 5pm) ? ? ? ?

## 2022-01-12 NOTE — Progress Notes (Signed)
?  [facility] Adult Case Management Discharge Plan : ? ?Will you be returning to the same living situation after discharge:  Yes,  pt will be returning home ?At discharge, do you have transportation home?: Yes,  pt's wife will be providing transportation home ?Do you have the ability to pay for your medications: Yes,  pt has [facility] Medicare ? ?Release of information consent forms completed and in the chart;  Patient's signature needed at discharge. ? ?Patient to Follow up at: ? Follow-up Information   ? ? [facility] Follow up on 01/19/2022.   ?Why: You have an appointment scheduled for follow up with your provider Eino Farber. She will follow up with you about scheduling your appointment with a therapist at the practice. Thanks! ?Contact information: ?Address: [address], [place], [place] [address] ?Phone: [phone] ? ?  ?  ? ? [facility] Follow up.   ?Specialty: Family Medicine ?Why: You have been put on a waitlist for therapy with behavioral medicine at [facility]. They will contact you when they have an opening for therapy. Thanks! ?Contact information: ?[place] ?[place] ?[phone] ? ?  ?  ? ?  ?  ? ?  ? ? ?Next level of care provider has access to [facility] ? ?Safety Planning and Suicide Prevention discussed: Yes,  completed with pt's wife Jeremy Shannon ? ?  ? ?Has patient been referred to the Quitline?: Patient refused referral ? ?Patient has been referred for addiction treatment: N/A ? ?Jeremy Shannon, [place] ?01/12/2022, 10:15 AM ?

## 2022-01-12 NOTE — Plan of Care (Signed)
?  Problem: Group Participation ?Goal: STG - Patient will engage in groups without prompting or encouragement from LRT x3 group sessions within 5 recreation therapy group sessions ?Description: STG - Patient will engage in groups without prompting or encouragement from LRT x3 group sessions within 5 recreation therapy group sessions ?01/12/2022 1439 by Ernest Haber, LRT ?Outcome: Not Applicable ?01/12/2022 1439 by Ernest Haber, LRT ?Outcome: Not Met (add Reason) ?Note: Patient spent his time in his room. ?  ?

## 2022-01-12 NOTE — Discharge Summary (Signed)
Physician Discharge Summary Note ? ?Patient:  Jeremy Shannon is an 66 y.o., male ?MRN:  [id number] ?DOB:  05/24/1956 ?Patient phone:  [phone] (home)  ?Patient address:   ?[address] ?[place] [address],  ?Total Time spent with patient: 1 hour ? ?Date of Admission:  12/31/2021 ?Date of Discharge: 01/12/2022 ? ?Reason for Admission:  Jeremy Shannon is a 66 year old male with a history of depression who was urgently brought to the emergency department after he was found down by his daughter following a suspected overdose on 10 tablets of 300 mg Seroquel.  Patient was admitted to the medical floor from April 19 to December 31, 2021, and then transferred to the geriatric psych unit for further evaluation, monitoring, and safekeeping after being deemed medically stable by the medical team and poison control. ?  ?Patient insists that he was not trying to kill himself.  States that he woke up around 3:00 in the morning and "was not feeling too hot," felt down for some reason he could not figure it out.  He wanted to go back to sleep and so took extra Seroquel.  "I guess I took too many."  Adds that he was feeling depersonalized, a feeling that he tends to get every 2 weeks or so, lasting a few minutes.  Adamantly indicates several times that he was not trying to kill himself.  "I love myself, love my family and friends."  Fixates on wanting to discharge home today.  He denies a history of suicide attempts and states that this is his first psychiatric hospitalization. ?  ?Denies depressive cognitions as well as symptoms of anxiety or irritability.  Denies homicidal thoughts.  Denies any challenges or stressors in life.  Denies symptoms of mania psychosis OCD and PTSD. ?  ?States that he sees Dr. Ysidro Evert at [facility] and has been on Seroquel for the past 8 months with good benefit with anxiety, depression and sleep.  Does not recall any other medications that has been on besides Xanax.  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 ?  ?Patient's wife called the unit and indicated that she found a loaded gun, rope and a knife in the trunk of patient's car. ? ?Principal Problem: Major depressive disorder, recurrent severe without psychotic features ([place]) ?Discharge Diagnoses: Principal Problem: ?  Major depressive disorder, recurrent severe without psychotic features ([place]) ? ? ?Past Psychiatric History: [facility] for past 20 years.  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  He has been on Rexulti.  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  States he goes to [facility] for the past year.  There is no inpatient admissions noted. ? ?Past Medical History:  ?Past Medical History:  ?Diagnosis Date  ? Anemia   ? pt denies  ? Anxiety   ? Cataract   ? Depression   ? Diabetes mellitus without complication ([place])   ? type 2  ? Dyspnea   ? Hyperlipidemia   ? PONV (postoperative nausea and vomiting)   ?  ?Past Surgical History:  ?Procedure Laterality Date  ? BRONCHIAL BIOPSY  02/16/2021  ? Procedure: BRONCHIAL BIOPSIES;  Surgeon: Garner Nash, DO;  Location: [facility];  Service: Pulmonary;;  ? BRONCHIAL BRUSHINGS  02/16/2021  ? Procedure: BRONCHIAL BRUSHINGS;  Surgeon: Garner Nash, DO;  Location: [facility];  Service: Pulmonary;;  ? BRONCHIAL NEEDLE ASPIRATION BIOPSY  02/16/2021  ? Procedure: BRONCHIAL NEEDLE ASPIRATION BIOPSIES;  Surgeon: Garner Nash, DO;  Location: [facility];  Service: Pulmonary;;  ? BRONCHIAL WASHINGS  02/16/2021  ? Procedure: BRONCHIAL WASHINGS;  Surgeon: Garner Nash, DO;  Location: [facility];  Service: Pulmonary;;  ? CATARACT EXTRACTION    ? bilateral  ? CHOLECYSTECTOMY N/A 02/10/2017  ? Procedure: LAPAROSCOPIC CHOLECYSTECTOMY WITH INTRAOPERATIVE CHOLANGIOGRAM;  Surgeon: Alphonsa Overall, MD;  Location: [facility];  Service: General;  Laterality:  N/A;  ? COLONOSCOPY    ? FIDUCIAL MARKER PLACEMENT  02/16/2021  ? Procedure: FIDUCIAL MARKER PLACEMENT;  Surgeon: Garner Nash, DO;  Location: [facility];  Service: Pulmonary;;  ? VIDEO BRONCHOSCOPY WITH ENDOBRONCHIAL NAVIGATION Right 02/16/2021  ? Procedure: VIDEO BRONCHOSCOPY WITH ENDOBRONCHIAL NAVIGATION;  Surgeon: Garner Nash, DO;  Location: [facility];  Service: Pulmonary;  Laterality: Right;  ? ?Family History:  ?Family History  ?Problem Relation Age of Onset  ? Liver cancer Mother   ? Depression Father   ? Drug abuse Father   ? ?Family Psychiatric  History: Unremarkable ?Social History:  ?Social History  ? ?Substance and Sexual Activity  ?Alcohol Use No  ?   ?Social History  ? ?Substance and Sexual Activity  ?Drug Use No  ?  ?Social History  ? ?Socioeconomic History  ? Marital status: Married  ?  Spouse name: Not on file  ? Number of children: Not on file  ? Years of education: Not on file  ? Highest education level: Not on file  ?Occupational History  ? Not on file  ?Tobacco Use  ? Smoking status: Every Day  ?  Packs/day: 1.00  ?  Years: 43.00  ?  Pack years: 43.00  ?  Types: Cigarettes  ?  Passive exposure: Current  ? Smokeless tobacco: Never  ? Tobacco comments:  ?  10-20 cigarettes smoked daily 02/17/2021  ?Vaping Use  ? Vaping Use: Never used  ?Substance and Sexual Activity  ? Alcohol use: No  ? Drug use: No  ? Sexual activity: Yes  ?  Partners: Female  ?  Birth control/protection: None  ?Other Topics Concern  ? Not on file  ?Social History Narrative  ? Patient is currently a smoker and states he is not ready to quit  ? ?Social Determinants of Health  ? ?Financial Resource Strain: Not on file  ?Food Insecurity: Not on file  ?Transportation Needs: Not on file  ?Physical Activity: Not on file  ?Stress: Not on file  ?Social Connections: Not on file  ? ? ?Hospital Course: [name] was admitted to inpatient psychiatry for internal medicine floor after an overdose.  He has been treated with Xanax for 20  years up until a year ago.  He has a real hard time controlling his anxiety and became impulsive, and became suicidal.  He regrets the suicidal attempt.  He does admit to feeling depressed and anxious.  He was on Seroquel which he overdosed on.  Numerous medications were initiated while he was on the inpatient unit including Risperdal, Neurontin, Remeron and trazodone.  Atarax probably worked the best but still did not really control his anxiety.  He did better with doxepin for sleep and depression and trazodone was added as needed in the daytime for his anxiety and Depakote was scheduled which seemed to help.  I explained to him and his wife that he needs a therapist to work through his anxiety and not to rely  so much on medications because eventually it we will just put himself to sleep.  He understood and agreed.  So did his wife.  He remained very isolative while on the unit but he did participate in groups.  He denied any side effects from his medications and his sleep would wax and wane along with his anxiety.  Overall, this was the best medication regimen that he responded well to.  He does have an outpatient psychiatrist so he will need to continue with med adjustments and outpatient.  He was never suicidal on the unit.  It was felt that he maximize hospitalization he was discharged home.  On the day of discharge, he denied suicidal ideation, homicidal ideation, auditory or visual hallucinations.  His judgment and insight were good. ? ?Physical Findings: ?AIMS:  , ,  ,  ,    ?CIWA:    ?COWS:    ? ?Musculoskeletal: ?Strength & Muscle Tone: within normal limits ?Gait & Station: normal ?Patient leans: N/A ? ? ?Psychiatric Specialty Exam: ? ?Presentation  ?General Appearance: Appropriate for Environment; Disheveled ? ?Eye Contact:Fair ? ?Speech:Clear and Coherent ? ?Speech Volume:Normal ? ?Handedness:Right ? ? ?Mood and Affect  ?Mood:Anxious ? ?Affect:Appropriate; Congruent ? ? ?Thought Process  ?Thought  Processes:Coherent; Linear ? ?Descriptions of Associations:Intact ? ?Orientation:Full (Time, Place and Person) ? ?Thought Content:Logical ? ?History of Schizophrenia/Schizoaffective disorder:No data recorded ?

## 2022-01-12 NOTE — Progress Notes (Signed)
D: Pt alert and oriented. Pt rates his anxiety/depression 3/10 at this time. Pt denies experiencing any pain at this time. Pt denies experiencing any SI/HI, or AVH at this time. Patient described current mood at time of assessment as, "hopeful."  ?A: Scheduled medications administered to patient as ordered. Trazodone 50 mg po prn given per request for insomnia at 2116. Support and encouragement provided. Routine safety checks conducted q15 minutes.  ?R: No adverse drug reactions noted. Administered Prn effective at reassessment. Pt verbally contracts for safety at this time. Pt complaint with medications. Pt interacts minimally/appropraitely with others on the unit. Pt remains safe at this time. Will continue to monitor.  ?

## 2022-01-12 NOTE — Progress Notes (Signed)
Recreation Therapy Notes ? ?INPATIENT RECREATION TR PLAN ? ?Patient Details ?Name: Jeremy Shannon ?MRN: [id number] ?DOB: 1955/10/30 ?Today's Date: 01/12/2022 ? ?Rec Therapy Plan ?Is patient appropriate for Therapeutic Recreation?: Yes ?Treatment times per week: at least 3 ?Estimated Length of Stay: 5-7 days ?TR Treatment/Interventions: Group participation (Comment) ? ?Discharge Criteria ?Pt will be discharged from therapy if:: Discharged ?Treatment plan/goals/alternatives discussed and agreed upon by:: Patient/family ? ?Discharge Summary ?Short term goals set: Patient will engage in groups without prompting or encouragement from LRT x3 group sessions within 5 recreation therapy group sessions ?Short term goals met: Not met ?Progress toward goals comments: Groups attended ?Which groups?: Social skills ?Reason goals not met: Patient spent most of his time in his room ?Therapeutic equipment acquired: N/A ?Reason patient discharged from therapy: Discharge from hospital ?Pt/family agrees with progress & goals achieved: Yes ?Date patient discharged from therapy: 01/12/22 ? ? ?Breckan Cafiero ?01/12/2022, 2:40 PM ?

## 2022-01-12 NOTE — Care Management Important Message (Signed)
Important Message ? ?Patient Details  ?Name: Jeremy Shannon ?MRN: [id number] ?Date of Birth: 1956-04-30 ? ? ?Medicare Important Message Given:  Yes ? ? ? ? ?Darshay Deupree A [place], [place] ?01/12/2022, 10:11 AM ?

## 2022-01-13 ENCOUNTER — Telehealth: Payer: Self-pay

## 2022-01-13 ENCOUNTER — Emergency Department (HOSPITAL_COMMUNITY)
Admission: EM | Admit: 2022-01-13 | Discharge: 2022-01-17 | Disposition: A | Discharge status: Other Acute Inpatient Hospital | Payer: Medicare Other | Attending: Emergency Medicine | Admitting: Emergency Medicine

## 2022-01-13 ENCOUNTER — Encounter (HOSPITAL_COMMUNITY): Payer: Self-pay

## 2022-01-13 ENCOUNTER — Other Ambulatory Visit: Payer: Self-pay

## 2022-01-13 ENCOUNTER — Emergency Department (HOSPITAL_COMMUNITY)
Admission: EM | Admit: 2022-01-13 | Discharge: 2022-01-13 | Discharge status: Against Medical Advice | Payer: Medicare Other | Source: Home / Self Care

## 2022-01-13 DIAGNOSIS — G20 Parkinson's disease: Secondary | ICD-10-CM | POA: Diagnosis not present

## 2022-01-13 DIAGNOSIS — Z046 Encounter for general psychiatric examination, requested by authority: Secondary | ICD-10-CM | POA: Diagnosis present

## 2022-01-13 DIAGNOSIS — X838XXA Intentional self-harm by other specified means, initial encounter: Secondary | ICD-10-CM | POA: Diagnosis not present

## 2022-01-13 DIAGNOSIS — T43292A Poisoning by other antidepressants, intentional self-harm, initial encounter: Secondary | ICD-10-CM | POA: Insufficient documentation

## 2022-01-13 DIAGNOSIS — F332 Major depressive disorder, recurrent severe without psychotic features: Secondary | ICD-10-CM | POA: Insufficient documentation

## 2022-01-13 DIAGNOSIS — T1491XA Suicide attempt, initial encounter: Secondary | ICD-10-CM | POA: Diagnosis present

## 2022-01-13 DIAGNOSIS — F32A Depression, unspecified: Secondary | ICD-10-CM | POA: Diagnosis present

## 2022-01-13 DIAGNOSIS — Z20822 Contact with and (suspected) exposure to covid-19: Secondary | ICD-10-CM | POA: Insufficient documentation

## 2022-01-13 DIAGNOSIS — R000 Tachycardia, unspecified: Secondary | ICD-10-CM | POA: Diagnosis not present

## 2022-01-13 DIAGNOSIS — R799 Abnormal finding of blood chemistry, unspecified: Secondary | ICD-10-CM | POA: Insufficient documentation

## 2022-01-13 DIAGNOSIS — T50902A Poisoning by unspecified drugs, medicaments and biological substances, intentional self-harm, initial encounter: Secondary | ICD-10-CM | POA: Diagnosis present

## 2022-01-13 DIAGNOSIS — R259 Unspecified abnormal involuntary movements: Secondary | ICD-10-CM | POA: Diagnosis present

## 2022-01-13 DIAGNOSIS — F1320 Sedative, hypnotic or anxiolytic dependence, uncomplicated: Secondary | ICD-10-CM | POA: Clinically undetermined

## 2022-01-13 DIAGNOSIS — R29818 Other symptoms and signs involving the nervous system: Secondary | ICD-10-CM | POA: Diagnosis present

## 2022-01-13 LAB — CBC WITH DIFFERENTIAL/PLATELET
Abs Immature Granulocytes: 0.02 10*3/uL (ref 0.00–0.07)
Basophils Absolute: 0 10*3/uL (ref 0.0–0.1)
Basophils Relative: 0 %
Eosinophils Absolute: 0 10*3/uL (ref 0.0–0.5)
Eosinophils Relative: 0 %
HCT: 48.4 % (ref 39.0–52.0)
Hemoglobin: 16.9 g/dL (ref 13.0–17.0)
Immature Granulocytes: 0 %
Lymphocytes Relative: 18 %
Lymphs Abs: 1.8 10*3/uL (ref 0.7–4.0)
MCH: 32.4 pg (ref 26.0–34.0)
MCHC: 34.9 g/dL (ref 30.0–36.0)
MCV: 92.7 fL (ref 80.0–100.0)
Monocytes Absolute: 0.5 10*3/uL (ref 0.1–1.0)
Monocytes Relative: 4 %
Neutro Abs: 7.8 10*3/uL — ABNORMAL HIGH (ref 1.7–7.7)
Neutrophils Relative %: 78 %
Platelets: 263 10*3/uL (ref 150–400)
RBC: 5.22 MIL/uL (ref 4.22–5.81)
RDW: 12.5 % (ref 11.5–15.5)
WBC: 10.2 10*3/uL (ref 4.0–10.5)
nRBC: 0 % (ref 0.0–0.2)

## 2022-01-13 LAB — COMPREHENSIVE METABOLIC PANEL
ALT: 22 U/L (ref 0–44)
AST: 17 U/L (ref 15–41)
Albumin: 3.8 g/dL (ref 3.5–5.0)
Alkaline Phosphatase: 54 U/L (ref 38–126)
Anion gap: 8 (ref 5–15)
BUN: 18 mg/dL (ref 8–23)
CO2: 24 mmol/L (ref 22–32)
Calcium: 8.9 mg/dL (ref 8.9–10.3)
Chloride: 106 mmol/L (ref 98–111)
Creatinine, Ser: 1.06 mg/dL (ref 0.61–1.24)
GFR, Estimated: >60 mL/min (ref >60)
Glucose, Bld: 115 mg/dL — ABNORMAL HIGH (ref 70–99)
Potassium: 4 mmol/L (ref 3.5–5.1)
Sodium: 138 mmol/L (ref 135–145)
Total Bilirubin: 0.8 mg/dL (ref 0.3–1.2)
Total Protein: 7 g/dL (ref 6.5–8.1)

## 2022-01-13 LAB — VALPROIC ACID LEVEL: Valproic Acid Lvl: 63 ug/mL (ref 50.0–100.0)

## 2022-01-13 NOTE — Telephone Encounter (Addendum)
Transition Care Management Unsuccessful Follow-up Telephone Call ? ?Date of discharge and from where:  [facility] 01-12-22 Dx: MDD ? ?Attempts:  1st Attempt ? ?Reason for unsuccessful TCM follow-up call:  Left voice message ? ?Transition Care Management Unsuccessful Follow-up Telephone Call ?TCM DC [facility] 01-12-22 Dx: MDD ?Date of discharge and from where:   ? ?Attempts:  2nd Attempt ? ?Reason for unsuccessful TCM follow-up call:  Left voice message ? ? ?Pt wife states she needs an appt to be seen by Dr Ronnald Ramp to discuss husband- message in chart as well  ?  ? ?  ?

## 2022-01-13 NOTE — ED Notes (Signed)
Pt was informed of [facility] Policy regarding dressing out into hospital attire, all belongings to be placed into belonging bags and labeled with pt ID. Pt acknowledged these instructions I provided. Pt had no further questions or concerns at this time.  

## 2022-01-13 NOTE — ED Notes (Signed)
Pt advises being unable to provide urine sample at this time, will monitor. 

## 2022-01-13 NOTE — ED Triage Notes (Signed)
Pt to ED by [employer] from home under IVC papers. Per IVC paperwork brought out by pts wife pt has had repeated incidents recently of SI with different plans to act upon. Pt is downplaying incidents and states he just got out of  yesterday. Pt states he didn't take 14 trazadone as reported and that he only took 1.Arrives A+O, VSS, NADN.  ?

## 2022-01-13 NOTE — ED Provider Notes (Signed)
?[facility] DEPT ?Provider Note ? ? ?CSN: [id number] ?Arrival date & time: 01/13/22  2233 ? ?  ? ?History ? ?Chief Complaint  ?Patient presents with  ? IVC  ? ? ?Jeremy Shannon is a 66 y.o. male. ? ?The history is provided by the patient and medical records.  ?Jeremy Shannon is a 66 y.o. male who presents to the Emergency Department complaining of IVC.  He presents to the emergency department by [employer] under IVC.  Per paperwork patient took 14 trazodone.  Patient states he did not take 14 trazodone.  He took 1 this morning.  He denies any active SI, HI.  He was just released from the hospital yesterday following hospitalization for overdose on Seroquel 2 weeks ago. ?  ? ?Home Medications ?Prior to Admission medications   ?Medication Sig Start Date End Date Taking? Authorizing Provider  ?divalproex (DEPAKOTE) 500 MG DR tablet Take 1 tablet (500 mg total) by mouth 2 (two) times daily at 8 am and 4 pm. 01/12/22   Parks Ranger, DO  ?doxepin (SINEQUAN) 150 MG capsule Take 1 capsule (150 mg total) by mouth at bedtime. 01/12/22   Parks Ranger, DO  ?empagliflozin (JARDIANCE) 10 MG TABS tablet Take 10 mg by mouth daily.    [provider]  ?FLUoxetine (PROZAC) 20 MG capsule Take 1 capsule (20 mg total) by mouth daily. 01/13/22   Parks Ranger, DO  ?icosapent Ethyl (VASCEPA) 1 g capsule Take 2 capsules (2 g total) by mouth 2 (two) times daily. ?Patient taking differently: Take 1 g by mouth 2 (two) times daily. 11/12/20   [name], MD  ?nicotine (NICODERM CQ - DOSED IN MG/24 HOURS) 14 mg/24hr patch Place 1 patch (14 mg total) onto the skin daily. 01/13/22   Parks Ranger, DO  ?simvastatin (ZOCOR) 20 MG tablet Take 1 tablet (20 mg total) by mouth at bedtime. 11/12/21   [name], MD  ?tamsulosin (FLOMAX) 0.4 MG CAPS capsule Take 1 capsule (0.4 mg total) by mouth daily. 12/31/21   Domenic Polite, MD  ?traZODone (DESYREL) 50 MG tablet Take 1 tablet  (50 mg total) by mouth 3 (three) times daily as needed for sleep (Anxiety). 01/12/22   Parks Ranger, DO  ?   ? ?Allergies    ?Patient has no known allergies.   ? ?Review of Systems   ?Review of Systems  ?All other systems reviewed and are negative. ? ?Physical Exam ?Updated Vital Signs ?BP (!) 136/92 (BP Location: Left Arm)   Pulse 89   Temp 97.7 ?F (36.5 ?C) (Oral)   Resp 18   SpO2 95%  ?Physical Exam ?Vitals and nursing note reviewed.  ?Constitutional:   ?   Appearance: He is well-developed.  ?HENT:  ?   Head: Normocephalic and atraumatic.  ?Cardiovascular:  ?   Rate and Rhythm: Normal rate and regular rhythm.  ?Pulmonary:  ?   Effort: Pulmonary effort is normal. No respiratory distress.  ?   Breath sounds: Normal breath sounds.  ?Abdominal:  ?   Palpations: Abdomen is soft.  ?   Tenderness: There is no abdominal tenderness. There is no guarding or rebound.  ?Musculoskeletal:     ?   General: No tenderness.  ?Skin: ?   General: Skin is warm and dry.  ?Neurological:  ?   Mental Status: He is alert and oriented to person, place, and time.  ?Psychiatric:     ?   Behavior: Behavior  normal.  ? ? ?ED Results / Procedures / Treatments   ?Labs ?(all labs ordered are listed, but only abnormal results are displayed) ?Labs Reviewed  ?COMPREHENSIVE METABOLIC PANEL - Abnormal; Notable for the following components:  ?    Result Value  ? Glucose, Bld 115 (*)   ? All other components within normal limits  ?CBC WITH DIFFERENTIAL/PLATELET - Abnormal; Notable for the following components:  ? Neutro Abs 7.8 (*)   ? All other components within normal limits  ?URINALYSIS, ROUTINE W REFLEX MICROSCOPIC - Abnormal; Notable for the following components:  ? Glucose, UA >=500 (*)   ? Ketones, ur 20 (*)   ? All other components within normal limits  ?ACETAMINOPHEN LEVEL - Abnormal; Notable for the following components:  ? Acetaminophen (Tylenol), Serum <10 (*)   ? All other components within normal limits  ?SALICYLATE LEVEL -  Abnormal; Notable for the following components:  ? Salicylate Lvl <2.9 (*)   ? All other components within normal limits  ?CBG MONITORING, ED - Abnormal; Notable for the following components:  ? Glucose-Capillary 101 (*)   ? All other components within normal limits  ?RESP PANEL BY RT-PCR (FLU A&B, COVID) [id number]  ?ETHANOL  ?RAPID URINE DRUG SCREEN, HOSP PERFORMED  ?VALPROIC ACID LEVEL  ? ? ?EKG ?EKG Interpretation ? ?Date/Time:  Friday Jan 14 2022 00:27:43 EDT ?Ventricular Rate:  76 ?PR Interval:  159 ?QRS Duration: 111 ?QT Interval:  379 ?QTC Calculation: 427 ?R Axis:   47 ?Text Interpretation: Sinus rhythm Abnormal R-wave progression, early transition Confirmed by Quintella Reichert [phone]) on 01/14/2022 1:08:08 AM ? ?Radiology ?No results found. ? ?Procedures ?Procedures  ? ? ?Medications Ordered in ED ?Medications - No data to display ? ?ED Course/ Medical Decision Making/ A&P ?  ?                        ?Medical Decision Making ?Amount and/or Complexity of Data Reviewed ?Labs: ordered. ? ? ?Patient here under IVC for suicide attempt by overdose.  Patient reports that he did not try to harm himself but he was recently admitted for suicide attempt and self-harm.  He was observed for several hours in the emergency department without any signs of toxicity.  He has been medically cleared for psychiatric evaluation and treatment. ? ? ? ? ? ? ? ?Final Clinical Impression(s) / ED Diagnoses ?Final diagnoses:  ?None  ? ? ?Rx / DC Orders ?ED Discharge Orders   ? ? None  ? ?  ? ? ?  ?Quintella Reichert, MD ?01/14/22 0543 ? ?

## 2022-01-14 DIAGNOSIS — G20 Parkinson's disease: Secondary | ICD-10-CM | POA: Diagnosis not present

## 2022-01-14 DIAGNOSIS — Z20822 Contact with and (suspected) exposure to covid-19: Secondary | ICD-10-CM | POA: Diagnosis not present

## 2022-01-14 DIAGNOSIS — R799 Abnormal finding of blood chemistry, unspecified: Secondary | ICD-10-CM | POA: Diagnosis not present

## 2022-01-14 DIAGNOSIS — R000 Tachycardia, unspecified: Secondary | ICD-10-CM | POA: Diagnosis not present

## 2022-01-14 DIAGNOSIS — F1320 Sedative, hypnotic or anxiolytic dependence, uncomplicated: Secondary | ICD-10-CM | POA: Clinically undetermined

## 2022-01-14 LAB — URINALYSIS, ROUTINE W REFLEX MICROSCOPIC
Bacteria, UA: NONE SEEN
Bilirubin Urine: NEGATIVE
Glucose, UA: >=500 mg/dL — AB
Hgb urine dipstick: NEGATIVE
Ketones, ur: 20 mg/dL — AB
Leukocytes,Ua: NEGATIVE
Nitrite: NEGATIVE
Protein, ur: NEGATIVE mg/dL
Specific Gravity, Urine: 1.03 (ref 1.005–1.030)
pH: 6 (ref 5.0–8.0)

## 2022-01-14 LAB — ETHANOL: Alcohol, Ethyl (B): <10 mg/dL (ref <10)

## 2022-01-14 LAB — RESP PANEL BY RT-PCR (FLU A&B, COVID) ARPGX2
Influenza A by PCR: NEGATIVE
Influenza B by PCR: NEGATIVE
SARS Coronavirus 2 by RT PCR: NEGATIVE

## 2022-01-14 LAB — RAPID URINE DRUG SCREEN, HOSP PERFORMED
Amphetamines: NOT DETECTED
Barbiturates: NOT DETECTED
Benzodiazepines: NOT DETECTED
Cocaine: NOT DETECTED
Opiates: NOT DETECTED
Tetrahydrocannabinol: NOT DETECTED

## 2022-01-14 LAB — SALICYLATE LEVEL: Salicylate Lvl: <7 mg/dL — ABNORMAL LOW (ref 7.0–30.0)

## 2022-01-14 LAB — ACETAMINOPHEN LEVEL: Acetaminophen (Tylenol), Serum: <10 ug/mL — ABNORMAL LOW (ref 10–30)

## 2022-01-14 LAB — CBG MONITORING, ED: Glucose-Capillary: 101 mg/dL — ABNORMAL HIGH (ref 70–99)

## 2022-01-14 MED ORDER — TAMSULOSIN HCL 0.4 MG PO CAPS: 0.4000 mg | ORAL_CAPSULE | Freq: Every day | ORAL | Status: DC

## 2022-01-14 MED ORDER — DIVALPROEX SODIUM 500 MG PO DR TAB
500.0000 mg | DELAYED_RELEASE_TABLET | Freq: Two times a day (BID) | ORAL | Status: DC
  Administered 2022-01-14 – 2022-01-17 (×6): 500 mg via ORAL
  Filled 2022-01-14 (×6): qty 1

## 2022-01-14 MED ORDER — FLUOXETINE HCL 20 MG PO CAPS
20.0000 mg | ORAL_CAPSULE | Freq: Every day | ORAL | Status: DC
  Administered 2022-01-14 – 2022-01-17 (×4): 20 mg via ORAL
  Filled 2022-01-14 (×4): qty 1

## 2022-01-14 MED ORDER — DOXEPIN HCL 50 MG PO CAPS
150.0000 mg | ORAL_CAPSULE | Freq: Every day | ORAL | Status: DC
  Administered 2022-01-14 – 2022-01-16 (×3): 150 mg via ORAL
  Filled 2022-01-14 (×3): qty 3

## 2022-01-14 MED ORDER — HYDROXYZINE HCL 25 MG PO TABS
25.0000 mg | ORAL_TABLET | Freq: Three times a day (TID) | ORAL | Status: DC | PRN
  Administered 2022-01-15: 25 mg via ORAL
  Filled 2022-01-14: qty 1

## 2022-01-14 MED ORDER — SIMVASTATIN 20 MG PO TABS
20.0000 mg | ORAL_TABLET | Freq: Every day | ORAL | Status: DC
  Administered 2022-01-14 – 2022-01-16 (×3): 20 mg via ORAL
  Filled 2022-01-14 (×3): qty 1

## 2022-01-14 NOTE — BH Assessment (Signed)
Comprehensive Clinical Assessment (CCA) Note ? ?01/14/2022 ?Jeremy Shannon ?[id number] ? ?Disposition: Jeremy Alar, NP recommend psychiatric Inpatient admission when medically cleared, disposition CSW to seek placement. Disposition discussed with Jeremy Shannon. Jeremy Shannon, Therapist, sports.  ? ?The patient demonstrates the following risk factors for suicide: Chronic risk factors for suicide include: psychiatric disorder of Major Depressive Disorder, recurrent, severe without psychotic features and completed suicide in a family member. Acute risk factors for suicide include:  Pt attempted suicide 3-4 weeks ago . Protective factors for this patient include: positive social support. Considering these factors, the overall suicide risk at this point appears to be . Patient is appropriate for outpatient follow up. ? ?Jeremy Shannon is a 66 year old male who presents involuntary and unaccompanied to [facility]. Clinician asked the pt, "what brought you to the hospital?" Pt reports, "my wife said I got 13 Trazodone pills and took them but I didn't." Pt reports, he was at home then the police and EMT came and took him to the hospital. Pt reports, about 3-4 weeks ago he overdosed on Seroquel as a suicide attempt; he did it around his birthday, he felt like he let his father down, he disappointed him. Pt reports, his only stressor is being in the hospital he just got out a few days ago. Pt reports, his father committed suicide 40 years ago. Pt reports, his wife got it wrong he did not take pills and is not suicidal. Pt also denies, HI, AVH, self-injurious behaviors and access to weapons.  ? ?Per EDP note on 01/14/2022: "He presents to the emergency department by [employer] under IVC.  Per paperwork patient took 14 trazodone. Patient states he did not take 14 trazodone. He took 1 this morning."  ? ?Pt denies, substance use. Pt's UDS is negative. Pt's BAL is <10. Pt is linked to Jeremy Shannon DMSc, PA-C, CAQ-PSY at [facility]. Pt reports, taking medication as prescribed. Pt was at [facility] from 01/30/2022-01/12/2022 for a suicide attempt.  ? ?Pt presents alert in scrubs with normal speech. Pt's mood was pleasant. Pt's affect was congruent. Pt's insight was lacking. Pt's judgement was poor. Pt reports, he can contract for safety. Clinician discussed the three possible dispositions (discharged with OPT resources, observe/reassess by psychiatry or inpatient treatment) in detail.  ? ?Diagnosis: Major Depressive Disorder, recurrent, severe without psychotic features.  ? ?*Please see Jeremy Alar, NP note on 01/14/2022 for collateral information with pt's wife.*  ? ?Chief Complaint:  ?Chief Complaint  ?Patient presents with  ? IVC  ? ?Visit Diagnosis:   ? ? ?CCA Screening, Triage and Referral (STR) ? ?Patient Reported Information ?How did you hear about [place]? Legal System ? ?What Is the Reason for Your Visit/Call Today? Per EDP note: "is a 66 y.o. male who presents to the Emergency Department complaining of IVC. He presents to the emergency department by [employer] under IVC. Per paperwork patient took 14 trazodone.  Patient states he did not take 14 trazodone. He took 1 this morning. He denies any active SI, HI.  He was just released from the hospital yesterday following hospitalization for overdose on Seroquel 2 weeks ago." ? ?How Long Has This Been Causing You Problems? 1 wk - 1 month ? ?What Do You Feel Would Help You the Most Today? Treatment for Depression or other mood problem ? ? ?Have You Recently Had Any Thoughts About Hurting Yourself? -- (Per IVC however pt denies.) ? ?Are You Planning to Commit Suicide/Harm  Yourself At This time? -- (Per IVC however pt denies.) ? ? ?Have you Recently Had Thoughts About [place]? No (Pt denies.) ? ?Are You Planning to Harm Someone at This Time? No (Pt denies.) ? ?Explanation: No data recorded ? ?Have You Used Any Alcohol or Drugs in the Past 24 Hours? No (Pt  denies.) ? ?How Long Ago Did You Use Drugs or Alcohol? No data recorded ?What Did You Use and How Much? No data recorded ? ?Do You Currently Have a Therapist/Psychiatrist? Yes ? ?Name of Therapist/Psychiatrist: Pt is linked to Jeremy Shannon  DMSc, PA-C, CAQ-PSY at [facility]. ? ? ?Have You Been Recently Discharged From Any Office Practice or Programs? No data recorded ?Explanation of Discharge From Practice/Program: No data recorded ? ?  ?CCA Screening Triage Referral Assessment ?Type of Contact: No data recorded ?Telemedicine Service Delivery:   ?Is this Initial or Reassessment? No data recorded ?Date Telepsych consult ordered in CHL:  No data recorded ?Time Telepsych consult ordered in CHL:  No data recorded ?Location of Assessment: [facility] ED ? ?Provider Location: [facility] Assessment Services ? ? ?Collateral Involvement: NP gathered collateral from pt's wife. ? ? ?Does Patient Have a Stage manager Guardian? No data recorded ?Name and Contact of Legal Guardian: No data recorded ?If Minor and Not Living with Parent(s), Who has Custody? No data recorded ?Is CPS involved or ever been involved? No data recorded ?Is APS involved or ever been involved? No data recorded ? ?Patient Determined To Be At Risk for Harm To Self or Others Based on Review of Patient Reported Information or Presenting Complaint? Yes, for Self-Harm (Per IVC however pt denies.) ? ?Method: No data recorded ?Availability of Means: No data recorded ?Intent: No data recorded ?Notification Required: No data recorded ?Additional Information for Danger to Others Potential: No data recorded ?Additional Comments for Danger to Others Potential: No data recorded ?Are There Guns or Other Weapons in [place]? No data recorded ?Types of Guns/Weapons: No data recorded ?Are These Weapons Safely Secured?                            No data recorded ?Who Could Verify You Are Able To Have These Secured: No data  recorded ?Do You Have any Outstanding Charges, Pending Court Dates, Parole/Probation? No data recorded ?Contacted To Inform of Risk of Harm To Self or Others: Family/Significant Other: ? ? ? ?Does Patient Present under Involuntary Commitment? Yes ? ?IVC Papers Initial File Date: 01/14/22 ? ? ?[place] of Residence: Kathleen Argue ? ? ?Patient Currently Receiving the Following Services: Medication Management ? ? ?Determination of Need: Emergent (2 hours) ? ? ?Options For Referral: Inpatient Hospitalization; [facility]; Medication Management; Outpatient Therapy ? ? ? ? ?CCA Biopsychosocial ?Patient Reported Schizophrenia/Schizoaffective Diagnosis in Past: No data recorded ? ?Strengths: No data recorded ? ?Mental Health Symptoms ?Depression:   ?Sleep (too much or little); Irritability (Overall sadness.) ?  ?Duration of Depressive symptoms:    ?Mania:   ?None ?  ?Anxiety:    ?Worrying ?  ?Psychosis:   ?None ?  ?Duration of Psychotic symptoms:    ?Trauma:   ?None ?  ?Obsessions:   ?None ?  ?Compulsions:   ?None ?  ?Inattention:  No data recorded  ?Hyperactivity/Impulsivity:   ?None ?  ?Oppositional/Defiant Behaviors:   ?None ?  ?Emotional Irregularity:   ?Recurrent suicidal behaviors/gestures/threats (Per IVC however pt denies.) ?  ?  Other Mood/Personality Symptoms:  No data recorded  ? ?Mental Status Exam ?Appearance and self-care  ?Stature:   ?Average ?  ?Weight:   ?Average weight ?  ?Clothing:   ?-- (Pt in scrubs.) ?  ?Grooming:   ?Normal ?  ?Cosmetic use:   ?None ?  ?Posture/gait:   ?Normal ?  ?Motor activity:   ?Not Remarkable ?  ?Sensorium  ?Attention:   ?Normal ?  ?Concentration:   ?Normal ?  ?Orientation:   ?X5 ?  ?Recall/memory:   ?Normal ?  ?Affect and Mood  ?Affect:  Congruent ?  ?Mood:  Other (Comment) (Pleasant.) ?  ?Relating  ?Eye contact:   ?Normal ?  ?Facial expression:   ?Responsive ?  ?Attitude toward examiner:   ?Cooperative ?  ?Thought and Language  ?Speech flow:  ?Normal ?  ?Thought content:    ?Appropriate to Mood and Circumstances ?  ?Preoccupation:   ?None ?  ?Hallucinations:   ?None ?  ?Organization:  No data recorded  ?Executive Functions  ?Fund of Knowledge:   ?[place] ?  ?Intelligence:  No data recorded  ?Abstraction

## 2022-01-14 NOTE — Consult Note (Signed)
Telepsych Consultation  ? ?Reason for Consult:  SI, OD ?Referring Physician:  Quintella Reichert, MD ?Location of Patient:  Jeremy Shannon [phone] ?Location of Provider: [place] Department ? ?Patient Identification: Jeremy Shannon ?MRN:  [id number] ?Principal Diagnosis: Intentional overdose ([place]) ?Diagnosis:  Principal Problem: ?  Intentional overdose ([place]) ?Active Problems: ?  Depression ?  Parkinsonian features ?  Suicide attempt [facility]) ? ? ?Total Time spent with patient: 20 minutes ? ?Subjective:   ?Jeremy Shannon is a 66 y.o. male patient admitted with intentional overdose. ? ?He presents alert, partially oriented. Impulsive. Hard to redirect. Repetitive speech. Minimal insight. States he doesn't know why he is here. Denies taking any pills. Patient becoming increasingly agitated and asking questions repeatedly regarding being discharged. States he was recently in [facility] for "major depression"; when provider mentioned overdose patient stated "I'm in a different place now, she didn't even put the pills out" and further denies intent of incident.  Unable to accept provider explanation of collateral process and concerns for safety. He denies suicidal/homicidal ideations, auditory and visual hallucinations.  ? ?CollateralGeorjean Shannon [phone] no answer ?[phone] incoming call: "Now I'm concerned because he's gone to a whole new level. He's lied before but this is just a lot. Years ago he used to talk about it (suicide) and make statements about not going to be here long". Patient's father committed suicide via hanging in the home they're currently live in when patient was in his 55's. Feels like over the years the thoughts have increased; he's been seen at [facility] 30 years with no therapy, only medications. Currently sees Jeremy Shannon. Reports years of prescription drug seeking/abuse, addiction. Began lying to get medications. Recent began talking to himself. Mom, "favorite uncle" died in past year;  anniversary of events is approaching. 12/22/21 bought a gun; found bag of heavy duty rope. Patient has not been completing household chores; gets up, sits on porch and continuously smokes cigarettes. Provider recently expressed concern for possible Parkinson's. On his birthday [birth year] daughter found patient unresponsive, patient had taken 81 Seroquel, was admitted to [facility]. Left suicide note. Says on day of discharge patient asked to get medications filled and by the time they were home pills were opened and taken 3 Trazodone. Says later in the night daughter noticed patient was acting strange (slurred speech, staggering) and it was found that all Trazodone was gone; wife states no one had any access but patient. EMS, police came; confiscated gun and asked wife to IVC patient for treatment. Does not feel patient is safe at this time.  ? ?EDRN note: 01/13/22 2244 "Pt to ED by [employer] from home under IVC papers. Per IVC paperwork brought out by pts wife pt has had repeated incidents recently of SI with different plans to act upon. Pt is downplaying incidents and states he just got out of [place] yesterday. Pt states he didn't take 14 trazadone as reported and that he only took 1.Arrives A+O, VSS, NADN" ? ?HPI:  Jeremy Shannon is a 66 year old male with past psychiatric history of intentional overdose, suicide attempt, depression, major depressive disorder recurrent severe without psychotic features who presented to [facility] via IVC for intentional overdose on Trazodone. Patient's father completed suicide. Of note patient discharge day of incident from [facility] due to suicide attempt via overdose on Seroquel.  ? ?Past Psychiatric History: intentional overdose, suicide attempt, depression, major depressive disorder recurrent severe without psychotic features ? ?Risk to Self:  yes ?Risk to Others:   ?  Prior Inpatient Therapy:  yes ?Prior Outpatient Therapy:   ? ?Past Medical History:  ?Past Medical History:  ?Diagnosis Date  ?  Anemia   ? pt denies  ? Anxiety   ? Cataract   ? Depression   ? Diabetes mellitus without complication ([place])   ? type 2  ? Dyspnea   ? Hyperlipidemia   ? PONV (postoperative nausea and vomiting)   ?  ?Past Surgical History:  ?Procedure Laterality Date  ? BRONCHIAL BIOPSY  02/16/2021  ? Procedure: BRONCHIAL BIOPSIES;  Surgeon: Garner Nash, DO;  Location: [facility];  Service: Pulmonary;;  ? BRONCHIAL BRUSHINGS  02/16/2021  ? Procedure: BRONCHIAL BRUSHINGS;  Surgeon: Garner Nash, DO;  Location: [place];  Service: Pulmonary;;  ? BRONCHIAL NEEDLE ASPIRATION BIOPSY  02/16/2021  ? Procedure: BRONCHIAL NEEDLE ASPIRATION BIOPSIES;  Surgeon: Garner Nash, DO;  Location: [place];  Service: Pulmonary;;  ? BRONCHIAL WASHINGS  02/16/2021  ? Procedure: BRONCHIAL WASHINGS;  Surgeon: Garner Nash, DO;  Location: [facility];  Service: Pulmonary;;  ? CATARACT EXTRACTION    ? bilateral  ? CHOLECYSTECTOMY N/A 02/10/2017  ? Procedure: LAPAROSCOPIC CHOLECYSTECTOMY WITH INTRAOPERATIVE CHOLANGIOGRAM;  Surgeon: Alphonsa Overall, MD;  [facility];  Service: General;  Laterality: N/A;  ? COLONOSCOPY    ? FIDUCIAL MARKER PLACEMENT  02/16/2021  ? Procedure: FIDUCIAL MARKER PLACEMENT;  Surgeon: Garner Nash, DO;  Location: [facility];  Service: Pulmonary;;  ? VIDEO BRONCHOSCOPY WITH ENDOBRONCHIAL NAVIGATION Right 02/16/2021  ? Procedure: VIDEO BRONCHOSCOPY WITH ENDOBRONCHIAL NAVIGATION;  Surgeon: Garner Nash, DO;  Location: [place];  Service: Pulmonary;  Laterality: Right;  ? ?Family History:  ?Family History  ?Problem Relation Age of Onset  ? Liver cancer Mother   ? Depression Father   ? Drug abuse Father   ? ?Family Psychiatric  History: father- completed suicide ? ?Social History:  ?Social History  ? ?Substance and Sexual Activity  ?Alcohol Use No  ?   ?Social History  ? ?Substance and Sexual Activity  ?Drug Use No  ?  ?Social History  ? ?Socioeconomic History  ? Marital status: Married  ?  Spouse name: Not  on file  ? Number of children: Not on file  ? Years of education: Not on file  ? Highest education level: Not on file  ?Occupational History  ? Not on file  ?Tobacco Use  ? Smoking status: Every Day  ?  Packs/day: 1.00  ?  Years: 43.00  ?  Pack years: 43.00  ?  Types: Cigarettes  ?  Passive exposure: Current  ? Smokeless tobacco: Never  ? Tobacco comments:  ?  10-20 cigarettes smoked daily 02/17/2021  ?Vaping Use  ? Vaping Use: Never used  ?Substance and Sexual Activity  ? Alcohol use: No  ? Drug use: No  ? Sexual activity: Yes  ?  Partners: Female  ?  Birth control/protection: None  ?Other Topics Concern  ? Not on file  ?Social History Narrative  ? Patient is currently a smoker and states he is not ready to quit  ? ?Social Determinants of Health  ? ?Financial Resource Strain: Not on file  ?Food Insecurity: Not on file  ?Transportation Needs: Not on file  ?Physical Activity: Not on file  ?Stress: Not on file  ?Social Connections: Not on file  ? ?Additional Social History: ?  ? ?Allergies:  No Known Allergies ? ?Labs:  ?Results for orders placed or performed during the hospital encounter of 01/13/22 (from  the past 48 hour(s))  ?Resp Panel by RT-PCR (Flu A&B, Covid) Nasopharyngeal Swab     Status: None  ? Collection Time: 01/13/22 11:05 PM  ? Specimen: Nasopharyngeal Swab; Nasopharyngeal(NP) swabs in vial transport medium  ?Result Value Ref Range  ? SARS Coronavirus 2 by RT PCR NEGATIVE NEGATIVE  ?  Comment: (NOTE) ?SARS-CoV-2 target nucleic acids are NOT DETECTED. ? ?The SARS-CoV-2 RNA is generally detectable in upper respiratory ?specimens during the acute phase of infection. The lowest ?concentration of SARS-CoV-2 viral copies this assay can detect is ?138 copies/mL. A negative result does not preclude SARS-Cov-2 ?infection and should not be used as the sole basis for treatment or ?other patient management decisions. A negative result may occur with  ?improper specimen collection/handling, submission of specimen  other ?than nasopharyngeal swab, presence of viral mutation(s) within the ?areas targeted by this assay, and inadequate number of viral ?copies(<138 copies/mL). A negative result must be combined with ?cl

## 2022-01-14 NOTE — BH Assessment (Signed)
Clinician message [name]. Bonis, RN: "Hey. It's Trey with TTS. Is the pt able to engage in the assessment, if so the pt will need to be placed in a private room. Also is the pt under IVC?"  ? ?Clinician awaiting response.  ? ?Vertell Novak, MS, [facility], CRC ?Triage Specialist ?[phone] ? ?

## 2022-01-14 NOTE — Progress Notes (Signed)
Patient has been faxed out per the request of Dr. Dwyane Dee. Patient meets [place] inpatient criteria per [place] Alar, NP. Patient has been faxed out to the following facilities:  ?[facility]  [address] [place] [address] [phone]  ?[place], [place] [address] [phone]  ?[place]  [place], [place] [address] [phone]  ?[facility]  [address] [place] [address] [phone]  ?[facility]  [address] [place], [place] [address] [phone]  ?[facility]  [place], [place] [address] [phone]  ?[facility]  [address] [place] [address] [phone]  ?[facility]  [address] [place] [address] [phone]  ?[place]  [address] [place] [address] [phone]  ?[facility]  [address], [place] [address] [phone]  ?[facility]  [address], [place] [address] [phone]  ?[facility]  [address], [place] [address] [phone]  ?[place], [place] [address] [phone]  ?[facility]  [place], [place] [address] [phone]  ?[facility]  [address] [place] [address] [phone]  ?[place] [address], [place] [address] [phone]  ? ?Mariea Clonts, MSW, LCSW-A  ?9:32 PM 01/14/2022   ?

## 2022-01-14 NOTE — ED Notes (Signed)
Pt currently sleeping, respirations equal & unlabored.  ?

## 2022-01-14 NOTE — ED Notes (Signed)
Pt's wife at bedside expressing concerning behaviors from patient at home. Pt's wife would like to be contacted by [facility] team for collateral information after assessment.  ?

## 2022-01-15 DIAGNOSIS — R000 Tachycardia, unspecified: Secondary | ICD-10-CM | POA: Diagnosis not present

## 2022-01-15 MED ORDER — HYDROXYZINE HCL 25 MG PO TABS
50.0000 mg | ORAL_TABLET | Freq: Three times a day (TID) | ORAL | Status: DC | PRN
  Administered 2022-01-15: 50 mg via ORAL
  Filled 2022-01-15: qty 2

## 2022-01-15 NOTE — ED Provider Notes (Signed)
Emergency Medicine Observation Re-evaluation Note ? ?Jeremy Shannon is a 66 y.o. male, seen on rounds today at 0700.  Pt initially presented to the ED for complaints of IVC ?Currently, the patient is resting comfortably. ? ?Physical Exam  ?BP 130/83 (BP Location: Right Arm)   Pulse 70   Temp 97.7 ?F (36.5 ?C) (Oral)   Resp 16   SpO2 96%  ?Physical Exam ?General: NAD ? ? ?ED Course / MDM  ?EKG:EKG Interpretation ? ?Date/Time:  Friday Jan 14 2022 00:27:43 EDT ?Ventricular Rate:  76 ?PR Interval:  159 ?QRS Duration: 111 ?QT Interval:  379 ?QTC Calculation: 427 ?R Axis:   47 ?Text Interpretation: Sinus rhythm Abnormal R-wave progression, early transition Confirmed by Quintella Reichert [phone]) on 01/14/2022 1:08:08 AM ? ?I have reviewed the labs performed to date as well as medications administered while in observation.  Recent changes in the last 24 hours include no acute events reported. ? ?Plan  ?Current plan is for placement. ?Jeremy Shannon is under involuntary commitment. ?  ? ?  ?Valarie Merino, MD ?01/15/22 [phone] ? ?

## 2022-01-15 NOTE — Consult Note (Signed)
Provider met with patient and wife this morning.  Patient appeared confused but answer few questions.  His wife did all the talking.  His wife reported that her husband was not treated well at [facility] and was discharged without safety in place.  She reported that her husband cannot be trusted with Medications- will take more than prescribed on purpose but nobody knows his intentions.  She reported that his father in law died by suicide and is afraid that is what her husband is doing.  She reported that her husband is still grieving the loss of his father.  She want treatment, therapy and outpatient intensive care after any discharge.  She want extra care in prescribing refills for her husband and possible restriction as to who picks Medications from Pharmacy.  This patient meets criteria for inpatient hospitalization to treat depression, anxiety and medication management.  We will continue to fax out records. ?Time spent 20 minutes. ?

## 2022-01-16 ENCOUNTER — Encounter: Payer: Self-pay | Admitting: Internal Medicine

## 2022-01-16 DIAGNOSIS — T50902A Poisoning by unspecified drugs, medicaments and biological substances, intentional self-harm, initial encounter: Secondary | ICD-10-CM

## 2022-01-16 LAB — CBG MONITORING, ED: Glucose-Capillary: 103 mg/dL — ABNORMAL HIGH (ref 70–99)

## 2022-01-16 MED ORDER — HYDROXYZINE HCL 25 MG PO TABS
25.0000 mg | ORAL_TABLET | Freq: Three times a day (TID) | ORAL | Status: DC | PRN
  Administered 2022-01-16 – 2022-01-17 (×4): 25 mg via ORAL
  Filled 2022-01-16 (×4): qty 1

## 2022-01-16 NOTE — ED Notes (Signed)
Attempted to call report to [facility]. Was sent to voicemail, and left a message. Will have [place] RN attempt to call. ?

## 2022-01-16 NOTE — ED Notes (Signed)
Pt's wife called concerning placement. SW given message to contact them. ?

## 2022-01-16 NOTE — Progress Notes (Signed)
Per [name], patient meets criteria for inpatient treatment. There are no available beds at [facility] today, per [facility]. CSW re-faxed referrals to the following facilities for review: ? ?[address] [place] [address] [phone] --  ?[place] N/A [address] [place] [address] [phone] --  ?[facility]  Pending - Request Sent N/A [address] [place] [address] [phone] --  ?[facility]  Pending - Request Sent N/A [address], [place] [address] [phone] --  ?[facility]  Pending - Request Sent N/A [address] [place], [place] [address] [phone] --  ?[place] N/A [address] [place] [address] [phone] --  ?[place] N/A [place], [place] [address] [phone] --  ?[place] N/A [address], [place] [address] [phone] --  ?[place] Sent N/A [place]., [place] [address] [phone] --  ?[place] N/A [address] [place] [address] [phone] --  ?[place] [address] [place] [address] [phone] --  ?[facility]  Pending - Request Sent N/A [address] [place] [address] [phone] --  ?[place] N/A [place], [place] [address] [phone] --  ?[place] N/A [place], [place] [address] [phone] --  ?[facility]  Pending - Request Sent N/A [address] [place] [address] [phone] --  ?[place]. [place], [place] [address] [phone] --  ?[facility]  Pending - No Request Sent N/A [address], [place] [address] [phone] --  ? ?TTS will continue to seek bed placement. ? ?Glennie [place], MSW, LCSW-A, LCAS-A ?Phone: [phone] ?Disposition/TOC ? ?

## 2022-01-16 NOTE — ED Notes (Signed)
Pt has been accepted to [facility]. Accepting provider is Dr. Merrie Roof. Patient can arrive 01/16/2022 after 9:00am. Number for report is [phone]. ? ?

## 2022-01-16 NOTE — Progress Notes (Addendum)
Per [place], admissions, pt has been accepted to [facility] [place] [facility]. Accepting provider is Dr. Merrie Roof. Patient can arrive 01/17/2022 after 9:00am. Number for report is [phone]. ? ?Glennie [place], MSW, LCSW-A ?Phone: [phone] ?Disposition/TOC ? ? ?

## 2022-01-16 NOTE — ED Notes (Signed)
Pt's wife asked that pt be showered while she is present. Clean linens provided and dirty linens removed out of the rm. ?

## 2022-01-16 NOTE — ED Notes (Signed)
Pt wife arrived and is visiting with them.  ?

## 2022-01-16 NOTE — ED Notes (Addendum)
Pt's wife is visiting. His POC was discussed and her belongings were placed in the pt locker. ?

## 2022-01-16 NOTE — Consult Note (Signed)
Patient is awake, alert and oriented x 4.  He is waiting for admission at a Geropsychiat[facility] unit and is being treated while in the ER.  He tripped on his side table and fell last night.  He denies any pain or discomfort associated with the fall.  Patient is wondering if he will be able to go home and get connected to outpatient Psychiatrist and therapist.  He also states that he does not mind getting treated at [facility] if there is a bed stating he was treated well last time he was admitted there.  He denies feeling suicidal and no AVH and no mention of Paranoia.  We will continue to monitor and continue to seek admission. ? ?

## 2022-01-16 NOTE — ED Notes (Signed)
Pt took AM meds. ?

## 2022-01-16 NOTE — ED Provider Notes (Signed)
He tripped over a chair in his room and fell, denies any injury.  On exam, there is no external sign of trauma.  There is full range of motion of all joints without pain.  Neurologic status is at baseline.  No indication for any imaging. ?  ?Delora Fuel, MD ?09/13/70 0128 ? ?

## 2022-01-16 NOTE — ED Notes (Signed)
Pt up standing in the doorway of his room to ask a question about the light. Pt witnessed to stumble backwards catching his foot on the chair and falling to the floor. No LOC or injuries noted, vital signs taken, MD and CN made aware of situation, Dr Roxanne Mins will be bedside to evaluate shortly. ?

## 2022-01-17 DIAGNOSIS — R000 Tachycardia, unspecified: Secondary | ICD-10-CM | POA: Diagnosis not present

## 2022-01-17 NOTE — Telephone Encounter (Signed)
Pt wife called in and is requesting a response from MD regarding message.  ?

## 2022-01-17 NOTE — BH Assessment (Addendum)
[facility] Assessment Progress Note ?  ?Pt has been accepted to [facility].  For details please seen note authored by Glennie Isle, LCSWA authored on 01/16/2022 at 14:48.  However, IVC under which pt arrived at [facility] did not have a First Examination completed within 24 hours of his arrival at the ED, and it therefore expired.  EDP Godfrey Pick, MD has therefore initiaed a new IVC with this Probation officer faxed to the magistrate.  Magistrate Samule Dry has upheld it and faxed a Findings and Custody Order to me, which an off duty [facility] has completed.  Documents have been faxed to [facility], and at 10:06 Heath Lark confirmed receipt.  Copies have been handed off to [facility] who is on hand to transport pt.  Pt is now ready for transfer.  Dr Doren Custard and pt's nurse, Nila Nephew, have been notified. ? ?Jalene Mullet, MA ?Behavioral Health Coordinator ?[phone]  ? ?

## 2022-01-17 NOTE — Telephone Encounter (Signed)
Wife stopped by the office. Very upset that patient is in a facility that does not allow visitors and she does not feel that he needs to be there. She is wanting him moved to the [place] center at [facility] and was told that Dr. Ronnald Ramp as patient's Primary Care doctor should be able to facilitate this move. Please call patient's wife [name] (she is on his DPR) to advise. Her call back number is [phone]. ?

## 2022-01-17 NOTE — ED Notes (Addendum)
[facility] transport called.  ?Pt given phone to call family.  ?

## 2022-01-17 NOTE — Telephone Encounter (Signed)
Pt's wife, Phebe Colla been informed that PCP does not have any control of the pt's placement. [name] has expressed understanding.  ?

## 2022-01-17 NOTE — ED Provider Notes (Signed)
Emergency Medicine Observation Re-evaluation Note ? ?Jeremy Shannon is a 66 y.o. male, seen on rounds today.  Pt initially presented to the ED for complaints of IVC ?Currently, the patient is resting in bed. ? ?Physical Exam  ?BP 95/68 (BP Location: Left Arm)   Pulse 95   Temp (!) 97.3 ?F (36.3 ?C) (Oral)   Resp 18   SpO2 98%  ?Physical Exam ?General: Awake, alert, calm ?Cardiac: Extremities well-perfused ?Lungs: Breathing is unlabored ?Psych: No agitation, does not appear to be responding to internal stimuli ? ?ED Course / MDM  ?EKG:EKG Interpretation ? ?Date/Time:  Saturday Jan 15 2022 16:21:40 EDT ?Ventricular Rate:  84 ?PR Interval:  144 ?QRS Duration: 96 ?QT Interval:  386 ?QTC Calculation: 456 ?R Axis:   46 ?Text Interpretation: Normal sinus rhythm Possible Lateral infarct , age undetermined Abnormal ECG When compared with ECG of 14-Jan-2022 00:27, No significant change was found Confirmed by Delora Fuel ([id number]) on 01/15/2022 11:22:49 PM ? ?I have reviewed the labs performed to date as well as medications administered while in observation.  Recent changes in the last 24 hours include patient was accepted to [facility] yesterday.  No answer was received when attempting to call report. ? ?Plan  ?Current plan is for admission to [facility] after 9 AM. ?Jeremy Shannon is under involuntary commitment. ?  ? ?  ?Godfrey Pick, MD ?01/17/22 [phone] ? ?

## 2022-01-17 NOTE — ED Notes (Signed)
Pt belongings moved to nurse's station cabinet 16-22 ?

## 2022-01-17 NOTE — ED Notes (Signed)
Pt family called requesting to speak to pt before transfer.  ?

## 2022-01-17 NOTE — ED Notes (Signed)
Report to [facility] RN ?

## 2022-01-31 ENCOUNTER — Inpatient Hospital Stay: Payer: Medicare Other | Admitting: Internal Medicine

## 2022-02-04 ENCOUNTER — Encounter: Payer: Self-pay | Admitting: Internal Medicine

## 2022-04-25 ENCOUNTER — Ambulatory Visit (INDEPENDENT_AMBULATORY_CARE_PROVIDER_SITE_OTHER): Payer: Medicare Other

## 2022-04-25 DIAGNOSIS — Z0000 Encounter for general adult medical examination without abnormal findings: Secondary | ICD-10-CM | POA: Diagnosis not present

## 2022-04-25 NOTE — Progress Notes (Signed)
Subjective:   Jeremy Shannon is a 66 y.o. male who presents for an Initial Medicare Annual Wellness Visit.   I connected with Shine Mikes  today by telephone and verified that I am speaking with the correct person using two identifiers. Location patient: home Location provider: work Persons participating in the virtual visit: patient, provider.   I discussed the limitations, risks, security and privacy concerns of performing an evaluation and management service by telephone and the availability of in person appointments. I also discussed with the patient that there may be a patient responsible charge related to this service. The patient expressed understanding and verbally consented to this telephonic visit.    Interactive audio and video telecommunications were attempted between this provider and patient, however failed, due to patient having technical difficulties OR patient did not have access to video capability.  We continued and completed visit with audio only.    Review of Systems     Cardiac Risk Factors include: advanced age (>54mn, >>25women);diabetes mellitus;male gender     Objective:    Today's Vitals   There is no height or weight on file to calculate BMI.     01/13/2022   10:49 PM 12/31/2021    4:00 PM 12/29/2021   10:56 AM 09/16/2021   11:54 AM 03/26/2021    4:19 PM 03/26/2021    8:56 AM 02/16/2021    7:44 AM  Advanced Directives  Does Patient Have a Medical Advance Directive? No  No Yes No Yes Yes  Type of AScientist, research (medical)Living will  [place]leas[place] will [place] Mill[place] will  Does patient want to make changes to medical advance directive?    No - Patient declined     Copy of [place] Chart?       No - copy requested  Would patient like information on creating a medical advance directive? No - Patient declined    No - Patient declined No - Patient declined       Information is confidential and restricted. Go to Review Flowsheets to unlock data.    Current Medications (verified) Outpatient Encounter Medications as of 04/25/2022  Medication Sig   doxepin (SINEQUAN) 150 MG capsule Take 1 capsule (150 mg total) by mouth at bedtime.   FLUoxetine (PROZAC) 20 MG tablet Take 20 mg by mouth daily. Morning   lamoTRIgine (LAMICTAL) 25 MG tablet Take 25 mg by mouth in the morning and at bedtime. Once in the morning and one at noon   nicotine (NICODERM CQ - DOSED IN MG/24 HOURS) 14 mg/24hr patch Place 1 patch (14 mg total) onto the skin daily.   OLANZapine (ZYPREXA) 10 MG tablet Take 10 mg by mouth at bedtime. At bedtime   OLANZapine (ZYPREXA) 5 MG tablet Take 5 mg by mouth in the morning and at bedtime. Once in the morning and once at noon   simvastatin (ZOCOR) 20 MG tablet Take 1 tablet (20 mg total) by mouth at bedtime.   divalproex (DEPAKOTE) 500 MG DR tablet Take 1 tablet (500 mg total) by mouth 2 (two) times daily at 8 am and 4 pm. (Patient not taking: Reported on 04/25/2022)   empagliflozin (JARDIANCE) 10 MG TABS tablet Take 10 mg by mouth daily. (Patient not taking: Reported on 04/25/2022)   FLUoxetine (PROZAC) 20 MG capsule Take 1 capsule (20 mg total) by mouth daily. (Patient not taking: Reported on 04/25/2022)   icosapent  Ethyl (VASCEPA) 1 g capsule Take 2 capsules (2 g total) by mouth 2 (two) times daily. (Patient not taking: Reported on 04/25/2022)   tamsulosin (FLOMAX) 0.4 MG CAPS capsule Take 1 capsule (0.4 mg total) by mouth daily. (Patient not taking: Reported on 01/14/2022)   traZODone (DESYREL) 50 MG tablet Take 1 tablet (50 mg total) by mouth 3 (three) times daily as needed for sleep (Anxiety). (Patient not taking: Reported on 04/25/2022)   No facility-administered encounter medications on file as of 04/25/2022.    Allergies (verified) Patient has no known allergies.   History: Past Medical History:  Diagnosis Date   Anemia    pt denies    Anxiety    Cataract    Depression    Diabetes mellitus without complication (HCC)    type 2   Dyspnea    Hyperlipidemia    PONV (postoperative nausea and vomiting)    Past Surgical History:  Procedure Laterality Date   BRONCHIAL BIOPSY  02/16/2021   Procedure: BRONCHIAL BIOPSIES;  Surgeon: Garner Nash, DO;  Location: [facility];  Service: Pulmonary;;   BRONCHIAL BRUSHINGS  02/16/2021   Procedure: BRONCHIAL BRUSHINGS;  Surgeon: Garner Nash, DO;  Location: [facility];  Service: Pulmonary;;   BRONCHIAL NEEDLE ASPIRATION BIOPSY  02/16/2021   Procedure: BRONCHIAL NEEDLE ASPIRATION BIOPSIES;  Surgeon: Garner Nash, DO;  Location: [facility];  Service: Pulmonary;;   BRONCHIAL WASHINGS  02/16/2021   Procedure: BRONCHIAL WASHINGS;  Surgeon: Garner Nash, DO;  Location: [facility];  Service: Pulmonary;;   CATARACT EXTRACTION     bilateral   CHOLECYSTECTOMY N/A 02/10/2017   Procedure: LAPAROSCOPIC CHOLECYSTECTOMY WITH INTRAOPERATIVE CHOLANGIOGRAM;  Surgeon: Alphonsa Overall, MD;  Location: [facility];  Service: General;  Laterality: N/A;   COLONOSCOPY     FIDUCIAL MARKER PLACEMENT  02/16/2021   Procedure: FIDUCIAL MARKER PLACEMENT;  Surgeon: Garner Nash, DO;  Location: [facility];  Service: Pulmonary;;   VIDEO BRONCHOSCOPY WITH ENDOBRONCHIAL NAVIGATION Right 02/16/2021   Procedure: VIDEO BRONCHOSCOPY WITH ENDOBRONCHIAL NAVIGATION;  Surgeon: Garner Nash, DO;  Location: [facility];  Service: Pulmonary;  Laterality: Right;   Family History  Problem Relation Age of Onset   Liver cancer Mother    Depression Father    Drug abuse Father    Social History   Socioeconomic History   Marital status: Married    Spouse name: Not on file   Number of children: Not on file   Years of education: Not on file   Highest education level: Not on file  Occupational History   Not on file  Tobacco Use   Smoking status: Every Day    Packs/day: 1.00    Years: 43.00    Total pack years:  43.00    Types: Cigarettes    Passive exposure: Current   Smokeless tobacco: Never   Tobacco comments:    10-20 cigarettes smoked daily 02/17/2021  Vaping Use   Vaping Use: Never used  Substance and Sexual Activity   Alcohol use: No   Drug use: No   Sexual activity: Yes    Partners: Female    Birth control/protection: None  Other Topics Concern   Not on file  Social History Narrative   Patient is currently a smoker and states he is not ready to quit   Social Determinants of Health   Financial Resource Strain: Low Risk  (04/25/2022)   Overall Financial Resource Strain (CARDIA)    Difficulty of Paying Living Expenses: Not hard  at all  Food Insecurity: No Food Insecurity (04/25/2022)   Hunger Vital Sign    Worried About Running Out of Food in the Last Year: Never true    Ran Out of Food in the Last Year: Never true  Transportation Needs: No Transportation Needs (04/25/2022)   PRAPARE - Hydrologist (Medical): No    Lack of Transportation (Non-Medical): No  Physical Activity: Insufficiently Active (04/25/2022)   Exercise Vital Sign    Days of Exercise per Week: 2 days    Minutes of Exercise per Session: 20 min  Stress: No Stress Concern Present (04/25/2022)   Guy    Feeling of Stress : Not at all  Social Connections: Moderately Integrated (04/25/2022)   Social Connection and Isolation Panel [NHANES]    Frequency of Communication with Friends and Family: Three times a week    Frequency of Social Gatherings with Friends and Family: Three times a week    Attends Religious Services: More than 4 times per year    Active Member of Clubs or Organizations: No    Attends Archivist Meetings: Never    Marital Status: Married    Tobacco Counseling Ready to quit: Not Answered Counseling given: Not Answered Tobacco comments: 10-20 cigarettes smoked daily 02/17/2021   Clinical  Intake:  Pre-visit preparation completed: Yes  Pain : No/denies pain     Nutritional Risks: None Diabetes: No  How often do you need to have someone help you when you read instructions, pamphlets, or other written materials from your doctor or pharmacy?: 1 - Never What is the last grade level you completed in school?: college  Diabetic?yes  Nutrition Risk Assessment:  Has the patient had any N/V/D within the last 2 months?  No  Does the patient have any non-healing wounds?  No  Has the patient had any unintentional weight loss or weight gain?  No   Diabetes:  Is the patient diabetic?  Yes  If diabetic, was a CBG obtained today?  No  Did the patient bring in their glucometer from home?  No  How often do you monitor your CBG's? Once week.   Financial Strains and Diabetes Management:  Are you having any financial strains with the device, your supplies or your medication? No .  Does the patient want to be seen by Chronic Care Management for management of their diabetes?  No  Would the patient like to be referred to a Nutritionist or for Diabetic Management?  No   Diabetic Exams:  Diabetic Eye Exam: Completed 10/2021 Diabetic Foot Exam: Overdue, Pt has been advised about the importance in completing this exam. Pt is scheduled for diabetic foot exam on next office visit .  Interpreter Needed?: No  Information entered by :: L.Wilson,LPN   Activities of Daily Living    04/25/2022    3:01 PM 12/31/2021    3:00 PM  In your present state of health, do you have any difficulty performing the following activities:  Hearing? 0   Vision? 0   Difficulty concentrating or making decisions? 0   Walking or climbing stairs? 0   Dressing or bathing? 0   Doing errands, shopping? 0   Preparing Food and eating ? N   Using the Toilet? N   In the past six months, have you accidently leaked urine? N   Do you have problems with loss of bowel control? N   Managing your  Medications? N    Managing your Finances? N   Housekeeping or managing your Housekeeping? N      Information is confidential and restricted. Go to Review Flowsheets to unlock data.    Patient Care Team: [name], MD as PCP - General (Internal Medicine) Alphonsa Overall, MD as Consulting Physician (General Surgery)  Indicate any recent Medical Services you may have received from other than [facility] providers in the past year (date may be approximate).     Assessment:   This is a routine wellness examination for Hrishikesh.  Hearing/Vision screen Vision Screening - Comments:: Annual eye exams wears reading glasses   Dietary issues and exercise activities discussed: Current Exercise Habits: Home exercise routine, Type of exercise: walking, Time (Minutes): 20, Frequency (Times/Week): 2, Weekly Exercise (Minutes/Week): 40, Intensity: Mild, Exercise limited by: None identified   Goals Addressed   None    Depression Screen    04/25/2022    3:01 PM 04/25/2022    2:59 PM 12/03/2020    2:16 PM  PHQ 2/9 Scores  PHQ - 2 Score 2 2 0    Fall Risk    04/25/2022    3:01 PM 03/03/2021    1:27 PM 12/03/2020    2:16 PM  [place] in the past year? 0 0 0  Number falls in past yr: 0    Injury with Fall? 0    Follow up Falls evaluation completed      [place]:  Any stairs in or around the home? Yes  If so, are there any without handrails? No  Home free of loose throw rugs in walkways, pet beds, electrical cords, etc? Yes  Adequate lighting in your home to reduce risk of falls? Yes   ASSISTIVE DEVICES UTILIZED TO PREVENT FALLS:  Life alert? No  Use of a cane, walker or w/c? No  Grab bars in the bathroom? No  Shower chair or bench in shower? No  Elevated toilet seat or a handicapped toilet? No     Cognitive Function:  Normal cognitive status assessed by telephone conversation  by this Nurse Health Advisor. No abnormalities found.         Immunizations Immunization History  Administered Date(s) Administered   Influenza-Unspecified 05/13/2020, 05/26/2020, 07/13/2021   PFIZER(Purple Top)SARS-COV-2 Vaccination 11/08/2019, 12/04/2019, 06/10/2020   PNEUMOCOCCAL CONJUGATE-20 11/11/2021   Pfizer Covid-19 Vaccine Bivalent Booster 59yr & up 07/13/2021   Pneumococcal-Unspecified 11/11/2020    TDAP status: Due, Education has been provided regarding the importance of this vaccine. Advised may receive this vaccine at local pharmacy or Health Dept. Aware to provide a copy of the vaccination record if obtained from local pharmacy or Health Dept. Verbalized acceptance and understanding.  Flu Vaccine status: Up to date  Pneumococcal vaccine status: Up to date  Covid-19 vaccine status: Completed vaccines  Qualifies for Shingles Vaccine? Yes   Zostavax completed No   Shingrix Completed?: No.    Education has been provided regarding the importance of this vaccine. Patient has been advised to call insurance company to determine out of pocket expense if they have not yet received this vaccine. Advised may also receive vaccine at local pharmacy or Health Dept. Verbalized acceptance and understanding.  Screening Tests Health Maintenance  Topic Date Due   Hepatitis C Screening  Never done   TETANUS/TDAP  Never done   Zoster Vaccines- Shingrix (1 of 2) Never done   OPHTHALMOLOGY EXAM  11/09/2021   COVID-19  Vaccine (5 - Pfizer series) 11/10/2021   INFLUENZA VACCINE  04/12/2022   HEMOGLOBIN A1C  07/02/2022   Diabetic kidney evaluation - Urine ACR  11/12/2022   FOOT EXAM  11/12/2022   Diabetic kidney evaluation - GFR measurement  01/14/2023   COLONOSCOPY (Pts 45-32yr Insurance coverage will need to be confirmed)  02/02/2028   Pneumonia Vaccine 66 Years old  Completed   HPV VACCINES  Aged Out    Health Maintenance  Health Maintenance Due  Topic Date Due   Hepatitis C Screening  Never done   TETANUS/TDAP  Never done   Zoster  Vaccines- Shingrix (1 of 2) Never done   OPHTHALMOLOGY EXAM  11/09/2021   COVID-19 Vaccine (5 - Pfizer series) 11/10/2021   INFLUENZA VACCINE  04/12/2022    Colorectal cancer screening: Type of screening: Colonoscopy. Completed 02/01/2018. Repeat every 10 years  Lung Cancer Screening: (Low Dose CT Chest recommended if Age 68-80years, 30 pack-year currently smoking OR have quit w/in 15years.) does not qualify.   Lung Cancer Screening Referral: n/a  Additional Screening:  Hepatitis C Screening: does not qualify;   Vision Screening: Recommended annual ophthalmology exams for early detection of glaucoma and other disorders of the eye. Is the patient up to date with their annual eye exam?  Yes  Who is the provider or what is the name of the office in which the patient attends annual eye exams? Dr.Romey  If pt is not established with a provider, would they like to be referred to a provider to establish care? No .   Dental Screening: Recommended annual dental exams for proper oral hygiene  Community Resource Referral / Chronic Care Management: CRR required this visit?  No   CCM required this visit?  No      Plan:     I have personally reviewed and noted the following in the patient's chart:   Medical and social history Use of alcohol, tobacco or illicit drugs  Current medications and supplements including opioid prescriptions. Patient is not currently taking opioid prescriptions. Functional ability and status Nutritional status Physical activity Advanced directives List of other physicians Hospitalizations, surgeries, and ER visits in previous 12 months Vitals Screenings to include cognitive, depression, and falls Referrals and appointments  In addition, I have reviewed and discussed with patient certain preventive protocols, quality metrics, and best practice recommendations. A written personalized care plan for preventive services as well as general preventive health  recommendations were provided to patient.     LDaphane Shepherd LPN   [id number]  Nurse Notes: none

## 2022-04-25 NOTE — Patient Instructions (Signed)
Jeremy Shannon , Thank you for taking time to come for your Medicare Wellness Visit. I appreciate your ongoing commitment to your health goals. Please review the following plan we discussed and let me know if I can assist you in the future.   Screening recommendations/referrals: Colonoscopy: 02/01/2018 Recommended yearly ophthalmology/optometry visit for glaucoma screening and checkup Recommended yearly dental visit for hygiene and checkup  Vaccinations: Influenza vaccine: completed  Pneumococcal vaccine: completed  Tdap vaccine: due  Shingles vaccine: will consider     Advanced directives: yes   Conditions/risks identified: none   Next appointment: none   Preventive Care 68 Years and Older, Male Preventive care refers to lifestyle choices and visits with your health care provider that can promote health and wellness. What does preventive care include? A yearly physical exam. This is also called an annual well check. Dental exams once or twice a year. Routine eye exams. Ask your health care provider how often you should have your eyes checked. Personal lifestyle choices, including: Daily care of your teeth and gums. Regular physical activity. Eating a healthy diet. Avoiding tobacco and drug use. Limiting alcohol use. Practicing safe sex. Taking low doses of aspirin every day. Taking vitamin and mineral supplements as recommended by your health care provider. What happens during an annual well check? The services and screenings done by your health care provider during your annual well check will depend on your age, overall health, lifestyle risk factors, and family history of disease. Counseling  Your health care provider may ask you questions about your: Alcohol use. Tobacco use. Drug use. Emotional well-being. Home and relationship well-being. Sexual activity. Eating habits. History of falls. Memory and ability to understand (cognition). Work and work  Statistician. Screening  You may have the following tests or measurements: Height, weight, and BMI. Blood pressure. Lipid and cholesterol levels. These may be checked every 5 years, or more frequently if you are over 62 years old. Skin check. Lung cancer screening. You may have this screening every year starting at age 61 if you have a 30-pack-year history of smoking and currently smoke or have quit within the past 15 years. Fecal occult blood test (FOBT) of the stool. You may have this test every year starting at age 39. Flexible sigmoidoscopy or colonoscopy. You may have a sigmoidoscopy every 5 years or a colonoscopy every 10 years starting at age 74. Prostate cancer screening. Recommendations will vary depending on your family history and other risks. Hepatitis C blood test. Hepatitis B blood test. Sexually transmitted disease (STD) testing. Diabetes screening. This is done by checking your blood sugar (glucose) after you have not eaten for a while (fasting). You may have this done every 1-3 years. Abdominal aortic aneurysm (AAA) screening. You may need this if you are a current or former smoker. Osteoporosis. You may be screened starting at age 22 if you are at high risk. Talk with your health care provider about your test results, treatment options, and if necessary, the need for more tests. Vaccines  Your health care provider may recommend certain vaccines, such as: Influenza vaccine. This is recommended every year. Tetanus, diphtheria, and acellular pertussis (Tdap, Td) vaccine. You may need a Td booster every 10 years. Zoster vaccine. You may need this after age 77. Pneumococcal 13-valent conjugate (PCV13) vaccine. One dose is recommended after age 51. Pneumococcal polysaccharide (PPSV23) vaccine. One dose is recommended after age 92. Talk to your health care provider about which screenings and vaccines you need and how often  you need them. This information is not intended to replace  advice given to you by your health care provider. Make sure you discuss any questions you have with your health care provider. Document Released: 09/25/2015 Document Revised: 05/18/2016 Document Reviewed: 06/30/2015 Elsevier Interactive Patient Education  2017 [place] Prevention in the Home Falls can cause injuries. They can happen to people of all ages. There are many things you can do to make your home safe and to help prevent falls. What can I do on the outside of my home? Regularly fix the edges of walkways and driveways and fix any cracks. Remove anything that might make you trip as you walk through a door, such as a raised step or threshold. Trim any bushes or trees on the path to your home. Use bright outdoor lighting. Clear any walking paths of anything that might make someone trip, such as rocks or tools. Regularly check to see if handrails are loose or broken. Make sure that both sides of any steps have handrails. Any raised decks and porches should have guardrails on the edges. Have any leaves, snow, or ice cleared regularly. Use sand or salt on walking paths during winter. Clean up any spills in your garage right away. This includes oil or grease spills. What can I do in the bathroom? Use night lights. Install grab bars by the toilet and in the tub and shower. Do not use towel bars as grab bars. Use non-skid mats or decals in the tub or shower. If you need to sit down in the shower, use a plastic, non-slip stool. Keep the floor dry. Clean up any water that spills on the floor as soon as it happens. Remove soap buildup in the tub or shower regularly. Attach bath mats securely with double-sided non-slip rug tape. Do not have throw rugs and other things on the floor that can make you trip. What can I do in the bedroom? Use night lights. Make sure that you have a light by your bed that is easy to reach. Do not use any sheets or blankets that are too big for your bed.  They should not hang down onto the floor. Have a firm chair that has side arms. You can use this for support while you get dressed. Do not have throw rugs and other things on the floor that can make you trip. What can I do in the kitchen? Clean up any spills right away. Avoid walking on wet floors. Keep items that you use a lot in easy-to-reach places. If you need to reach something above you, use a strong step stool that has a grab bar. Keep electrical cords out of the way. Do not use floor polish or wax that makes floors slippery. If you must use wax, use non-skid floor wax. Do not have throw rugs and other things on the floor that can make you trip. What can I do with my stairs? Do not leave any items on the stairs. Make sure that there are handrails on both sides of the stairs and use them. Fix handrails that are broken or loose. Make sure that handrails are as long as the stairways. Check any carpeting to make sure that it is firmly attached to the stairs. Fix any carpet that is loose or worn. Avoid having throw rugs at the top or bottom of the stairs. If you do have throw rugs, attach them to the floor with carpet tape. Make sure that you have a light  switch at the top of the stairs and the bottom of the stairs. If you do not have them, ask someone to add them for you. What else can I do to help prevent falls? Wear shoes that: Do not have high heels. Have rubber bottoms. Are comfortable and fit you well. Are closed at the toe. Do not wear sandals. If you use a stepladder: Make sure that it is fully opened. Do not climb a closed stepladder. Make sure that both sides of the stepladder are locked into place. Ask someone to hold it for you, if possible. Clearly mark and make sure that you can see: Any grab bars or handrails. First and last steps. Where the edge of each step is. Use tools that help you move around (mobility aids) if they are needed. These  include: Canes. Walkers. Scooters. Crutches. Turn on the lights when you go into a dark area. Replace any light bulbs as soon as they burn out. Set up your furniture so you have a clear path. Avoid moving your furniture around. If any of your floors are uneven, fix them. If there are any pets around you, be aware of where they are. Review your medicines with your doctor. Some medicines can make you feel dizzy. This can increase your chance of falling. Ask your doctor what other things that you can do to help prevent falls. This information is not intended to replace advice given to you by your health care provider. Make sure you discuss any questions you have with your health care provider. Document Released: 06/25/2009 Document Revised: 02/04/2016 Document Reviewed: 10/03/2014 Elsevier Interactive Patient Education  2017 [employer].

## 2022-04-26 ENCOUNTER — Encounter: Payer: Self-pay | Admitting: Internal Medicine

## 2022-04-26 ENCOUNTER — Ambulatory Visit (INDEPENDENT_AMBULATORY_CARE_PROVIDER_SITE_OTHER): Payer: Medicare Other | Admitting: Internal Medicine

## 2022-04-26 VITALS — BP 114/84 | HR 83 | Temp 98.2°F | Resp 16 | Ht 68.0 in | Wt 212.0 lb

## 2022-04-26 DIAGNOSIS — Z1159 Encounter for screening for other viral diseases: Secondary | ICD-10-CM

## 2022-04-26 DIAGNOSIS — G629 Polyneuropathy, unspecified: Secondary | ICD-10-CM

## 2022-04-26 DIAGNOSIS — I10 Essential (primary) hypertension: Secondary | ICD-10-CM | POA: Diagnosis not present

## 2022-04-26 DIAGNOSIS — E118 Type 2 diabetes mellitus with unspecified complications: Secondary | ICD-10-CM | POA: Diagnosis not present

## 2022-04-26 DIAGNOSIS — L859 Epidermal thickening, unspecified: Secondary | ICD-10-CM | POA: Diagnosis not present

## 2022-04-26 LAB — BASIC METABOLIC PANEL
BUN: 21 mg/dL (ref 6–23)
CO2: 28 mEq/L (ref 19–32)
Calcium: 9.5 mg/dL (ref 8.4–10.5)
Chloride: 105 mEq/L (ref 96–112)
Creatinine, Ser: 1.15 mg/dL (ref 0.40–1.50)
GFR: 65.48 mL/min (ref >60.00)
Glucose, Bld: 86 mg/dL (ref 70–99)
Potassium: 4.8 mEq/L (ref 3.5–5.1)
Sodium: 140 mEq/L (ref 135–145)

## 2022-04-26 LAB — HEMOGLOBIN A1C: Hgb A1c MFr Bld: 6.3 % (ref 4.6–6.5)

## 2022-04-26 NOTE — Patient Instructions (Signed)
                                                                    [email or web address]                       

## 2022-04-26 NOTE — Progress Notes (Signed)
Subjective:  Patient ID: Jeremy Shannon, male    DOB: February 12, 1956  Age: 66 y.o. MRN: [id number]  CC: Hypertension   HPI Jeremy Shannon presents for f/up -  His wife did most of the talking today.  In general she thinks he is better.  She is concerned that he may have Parkinson's disease because he has shuffling of the feet, memory loss, and numbness.  She is also concerned that he has scratches on his head.  She also complains that he has crusty areas on his feet and wants to have this evaluated and possibly treated.  Outpatient Medications Prior to Visit  Medication Sig Dispense Refill   doxepin (SINEQUAN) 150 MG capsule Take 1 capsule (150 mg total) by mouth at bedtime. 30 capsule 3   FLUoxetine (PROZAC) 20 MG tablet Take 20 mg by mouth daily. Morning     OLANZapine (ZYPREXA) 10 MG tablet Take 10 mg by mouth at bedtime. At bedtime     OLANZapine (ZYPREXA) 5 MG tablet Take 5 mg by mouth in the morning and at bedtime. Once in the morning and once at noon     simvastatin (ZOCOR) 20 MG tablet Take 1 tablet (20 mg total) by mouth at bedtime. 90 tablet 1   divalproex (DEPAKOTE) 500 MG DR tablet Take 1 tablet (500 mg total) by mouth 2 (two) times daily at 8 am and 4 pm. 60 tablet 3   empagliflozin (JARDIANCE) 10 MG TABS tablet Take 10 mg by mouth daily.     FLUoxetine (PROZAC) 20 MG capsule Take 1 capsule (20 mg total) by mouth daily. 30 capsule 3   icosapent Ethyl (VASCEPA) 1 g capsule Take 2 capsules (2 g total) by mouth 2 (two) times daily. 360 capsule 1   lamoTRIgine (LAMICTAL) 25 MG tablet Take 25 mg by mouth in the morning and at bedtime. Once in the morning and one at noon     nicotine (NICODERM CQ - DOSED IN MG/24 HOURS) 14 mg/24hr patch Place 1 patch (14 mg total) onto the skin daily. 28 patch 0   tamsulosin (FLOMAX) 0.4 MG CAPS capsule Take 1 capsule (0.4 mg total) by mouth daily. 30 capsule 0   traZODone (DESYREL) 50 MG tablet Take 1 tablet (50 mg total) by mouth 3 (three) times  daily as needed for sleep (Anxiety). 90 tablet 3   No facility-administered medications prior to visit.    ROS Review of Systems  Constitutional:  Positive for fatigue. Negative for chills, diaphoresis, fever and unexpected weight change.  HENT: Negative.    Eyes: Negative.   Respiratory:  Negative for cough, chest tightness, shortness of breath and wheezing.   Cardiovascular:  Negative for chest pain, palpitations and leg swelling.  Gastrointestinal:  Negative for abdominal pain, constipation, diarrhea, nausea and vomiting.  Endocrine: Negative.   Genitourinary: Negative.   Musculoskeletal: Negative.   Skin: Negative.   Neurological:  Positive for numbness. Negative for dizziness, weakness and light-headedness.  Hematological:  Negative for adenopathy. Does not bruise/bleed easily.  Psychiatric/Behavioral:  Positive for decreased concentration and dysphoric mood. Negative for behavioral problems, self-injury, sleep disturbance and suicidal ideas. The patient is not nervous/anxious.     Objective:  BP 114/84 (BP Location: Left Arm, Patient Position: Sitting, Cuff Size: Large)   Pulse 83   Temp 98.2 F (36.8 C) (Oral)   Resp 16   Ht '5\' 8"'$  (1.727 m)   Wt 212 lb (96.2 kg)   SpO2 90%   BMI  32.23 kg/m   BP Readings from Last 3 Encounters:  04/26/22 114/84  01/17/22 95/69  12/31/21 123/88    Wt Readings from Last 3 Encounters:  04/26/22 212 lb (96.2 kg)  12/29/21 209 lb (94.8 kg)  11/11/21 209 lb (94.8 kg)    Physical Exam Vitals reviewed.  Constitutional:      Appearance: He is ill-appearing.     Comments: There are healed, scabbed excoriations on the scalp.  HENT:     Mouth/Throat:     Mouth: Mucous membranes are moist.  Eyes:     General: No scleral icterus.    Conjunctiva/sclera: Conjunctivae normal.  Cardiovascular:     Rate and Rhythm: Normal rate and regular rhythm.     Heart sounds: No murmur heard. Pulmonary:     Effort: Pulmonary effort is normal.      Breath sounds: No stridor. No wheezing, rhonchi or rales.  Abdominal:     General: Abdomen is flat.     Palpations: There is no mass.     Tenderness: There is no abdominal tenderness. There is no guarding.     Hernia: No hernia is present.  Musculoskeletal:        General: Normal range of motion.     Cervical back: Neck supple.     Right lower leg: No edema.     Left lower leg: No edema.  Skin:    General: Skin is warm and dry.     Findings: Lesion present. No rash.     Comments: There is hyperkeratosis on his toes.  Neurological:     General: No focal deficit present.     Mental Status: He is alert.     Lab Results  Component Value Date   WBC 10.2 01/13/2022   HGB 16.9 01/13/2022   HCT 48.4 01/13/2022   PLT 263 01/13/2022   GLUCOSE 86 04/26/2022   CHOL 149 11/11/2021   TRIG 217.0 (H) 11/11/2021   HDL 47.40 11/11/2021   LDLDIRECT 70.0 11/11/2021   ALT 22 01/13/2022   AST 17 01/13/2022   NA 140 04/26/2022   K 4.8 04/26/2022   CL 105 04/26/2022   CREATININE 1.15 04/26/2022   BUN 21 04/26/2022   CO2 28 04/26/2022   TSH 3.09 11/11/2021   PSA 0.42 11/11/2021   HGBA1C 6.3 04/26/2022   MICROALBUR 1.1 11/11/2021    No results found.  Assessment & Plan:   Author was seen today for hypertension.  Diagnoses and all orders for this visit:  Primary hypertension- His blood pressure is well controlled. -     Basic metabolic panel; Future -     Basic metabolic panel  Need for hepatitis C screening test -     Hepatitis C antibody; Future -     Hepatitis C antibody  Type II diabetes mellitus with complication ([place])- His blood sugar is adequately well controlled. -     Basic metabolic panel; Future -     Hemoglobin A1c; Future -     Hemoglobin A1c -     Basic metabolic panel  Neuropathy -     Ambulatory referral to Neurology  Hyperkeratosis of sole -     Ambulatory referral to Podiatry   I have discontinued [name]. Husain's icosapent Ethyl, empagliflozin,  tamsulosin, nicotine, traZODone, divalproex, and lamoTRIgine. I am also having him maintain his simvastatin, doxepin, OLANZapine, OLANZapine, and FLUoxetine.  No orders of the defined types were placed in this encounter.    Follow-up: Return  in about 6 months (around 10/27/2022).  Scarlette Calico, MD

## 2022-04-27 LAB — HEPATITIS C ANTIBODY: Hepatitis C Ab: NONREACTIVE

## 2022-05-02 MED ORDER — BOOSTRIX 5-2.5-18.5 LF-MCG/0.5 IM SUSP: 0.5000 mL | Freq: Once | INTRAMUSCULAR | 0 refills | Status: AC

## 2022-05-02 MED ORDER — SHINGRIX 50 MCG/0.5ML IM SUSR: 0.5000 mL | Freq: Once | INTRAMUSCULAR | 1 refills | Status: AC

## 2022-05-10 ENCOUNTER — Ambulatory Visit: Payer: Medicare Other | Admitting: Podiatry

## 2022-05-11 ENCOUNTER — Telehealth: Payer: Self-pay

## 2022-05-11 NOTE — Telephone Encounter (Signed)
I dont see this as an option to prescribe per Epic, sorry  I would suggest other OTC nicotine replacment

## 2022-05-11 NOTE — Telephone Encounter (Signed)
Pt wife is requesting a Rx for: Rugby Nicotine 21 mg step one patches.  Pt has been getting them OTC but now pharmacy says a Rx is needed.  Pharmacy: [facility] [place], [place] [address] AT [place] 04/26/22 ROV 10/27/22

## 2022-05-12 NOTE — Telephone Encounter (Signed)
LVM to discuss.  Pt should explore other OTC options as "Rugby Nicotine patch" is not listed in out system to prescribe.

## 2022-05-23 ENCOUNTER — Ambulatory Visit (INDEPENDENT_AMBULATORY_CARE_PROVIDER_SITE_OTHER): Payer: Self-pay | Admitting: Podiatry

## 2022-05-23 DIAGNOSIS — Z91199 Patient's noncompliance with other medical treatment and regimen due to unspecified reason: Secondary | ICD-10-CM

## 2022-05-23 NOTE — Progress Notes (Signed)
Patient was no-show for appointment today 

## 2022-06-07 ENCOUNTER — Ambulatory Visit (INDEPENDENT_AMBULATORY_CARE_PROVIDER_SITE_OTHER): Payer: Medicare Other | Admitting: Podiatry

## 2022-06-07 DIAGNOSIS — B353 Tinea pedis: Secondary | ICD-10-CM

## 2022-06-07 DIAGNOSIS — L280 Lichen simplex chronicus: Secondary | ICD-10-CM | POA: Diagnosis not present

## 2022-06-07 MED ORDER — CLOTRIMAZOLE-BETAMETHASONE 1-0.05 % EX CREA: 1.0000 | TOPICAL_CREAM | Freq: Two times a day (BID) | CUTANEOUS | 0 refills | Status: DC

## 2022-06-08 IMAGING — CT CT HEAD W/O CM
4 series · 16 of 37 positions shown, 18 images · non-contrast
Comparison: Head CT 03/26/2021 and prior cervical spine MRI
03/14/2021

CLINICAL DATA: Found down.



[Series 3: head wo · axial · 0.46mm/px · z∈[+924,+1030]mm · 4 of 36 slices shown (1 of 2)]
[im 8/36  brain]
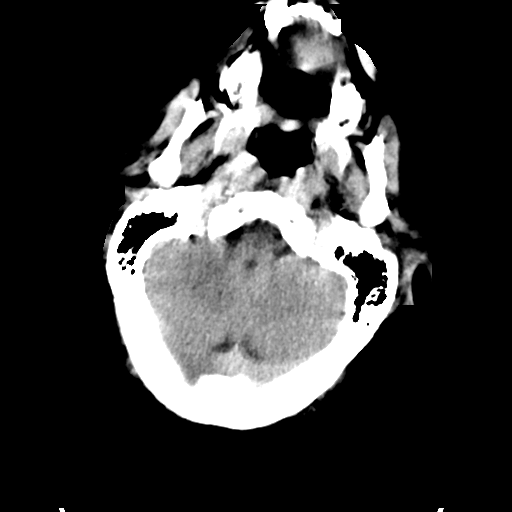
[im 15/36  brain]
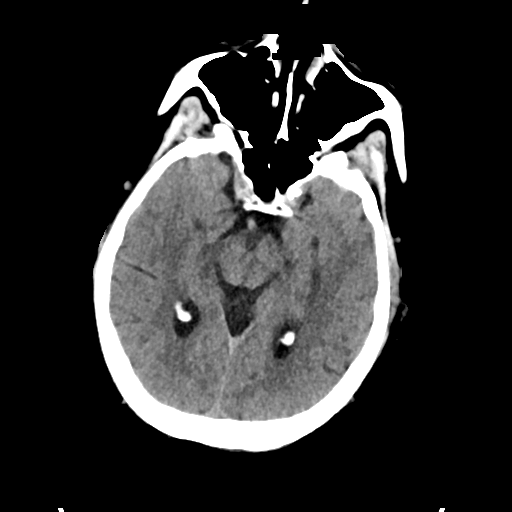
[im 22/36  brain]
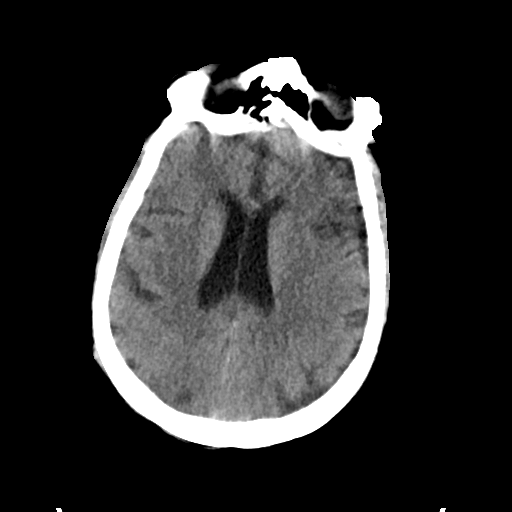
[im 29/36  brain]
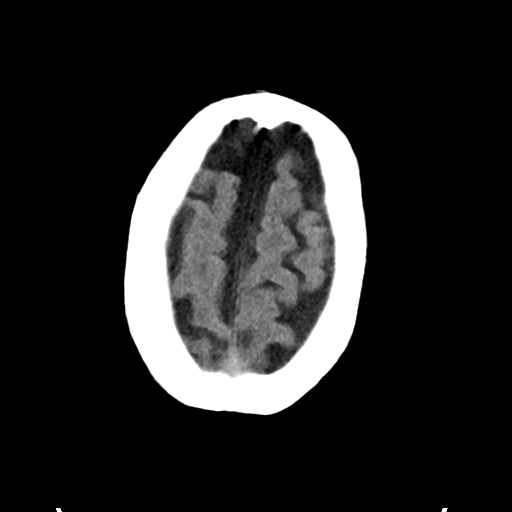

[Series 4: head wo · axial · 0.56mm/px · z∈[+914,+1034]mm · 5 of 36 slices shown, 7 images (2 of 2)]
[im 6/36  brain]
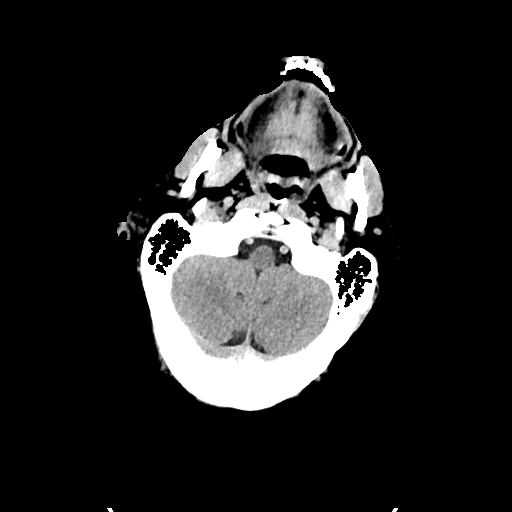
[im 6/36  bone]
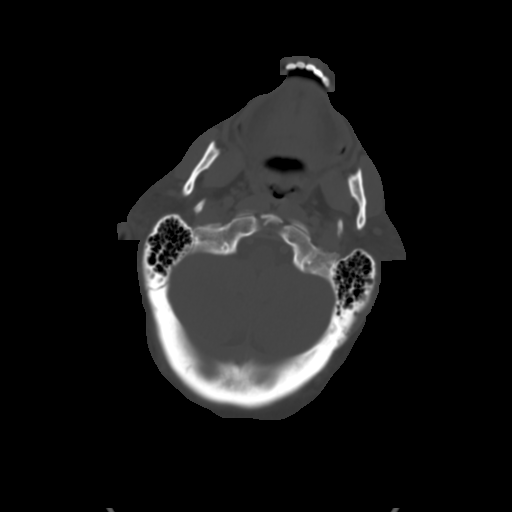
[im 12/36  brain]
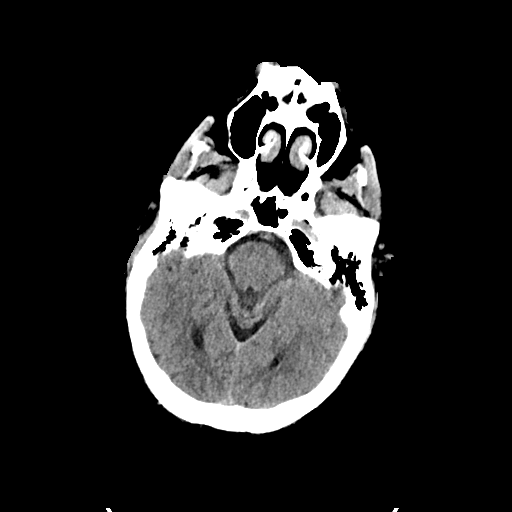
[im 18/36  brain]
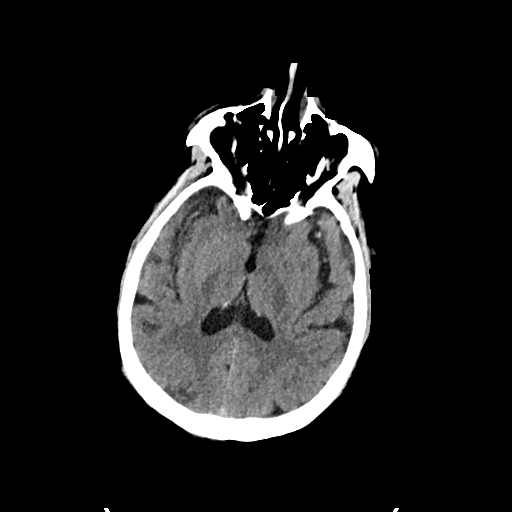
[im 24/36  brain]
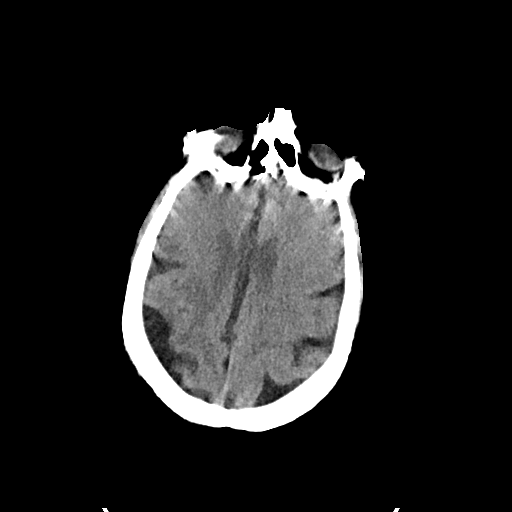
[im 30/36  brain]
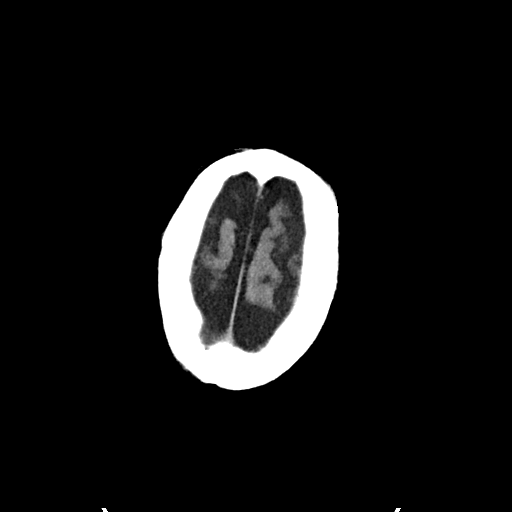
[im 30/36  bone]
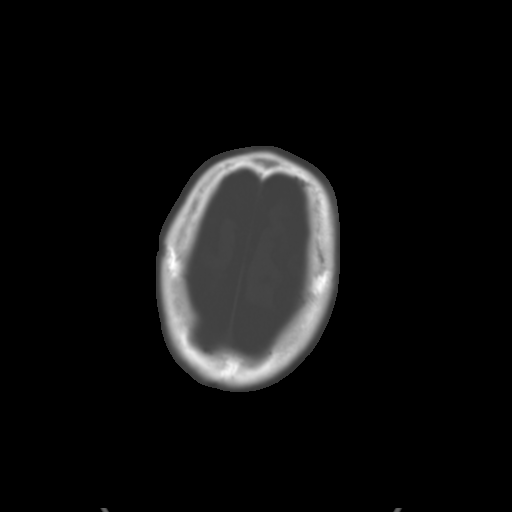

[Series 5: head bone · axial · 0.56mm/px · z∈[+900,+966]mm · 4 of 89 slices shown]
[im 6/89  bone]
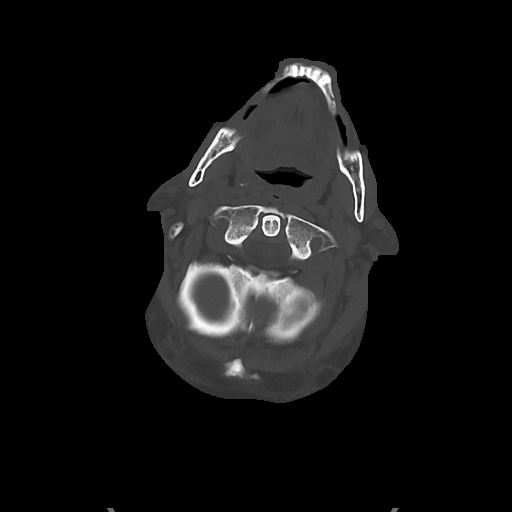
[im 17/89  bone]
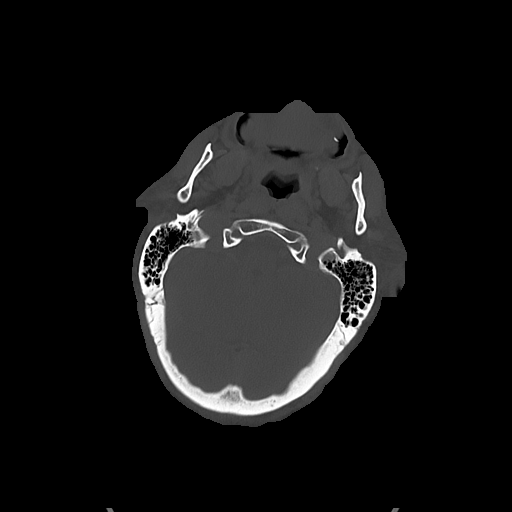
[im 28/89  bone]
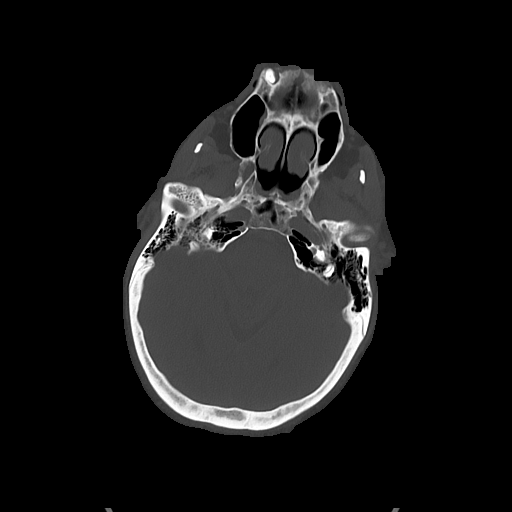
[im 39/89  bone]
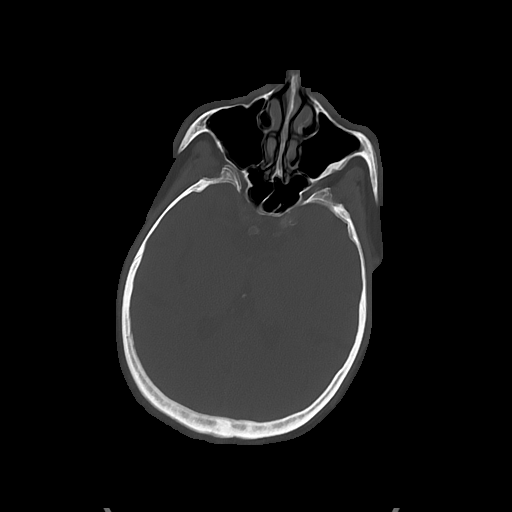

[Series 7: sag soft · sagittal · 0.40mm/px · 3 of 61 slices shown]
[im 21/61  brain]
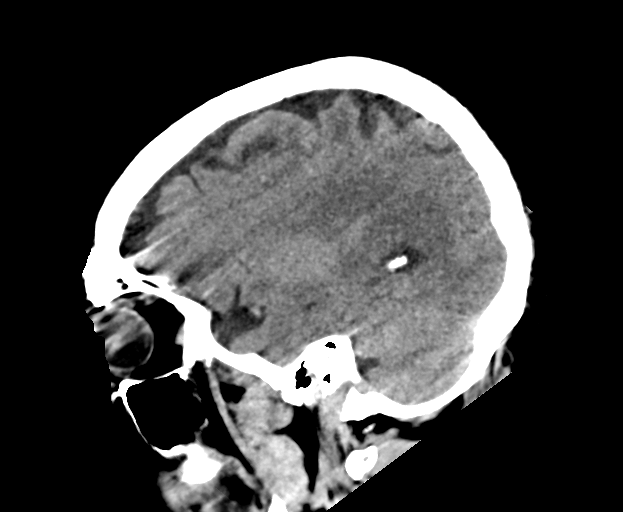
[im 31/61  brain]
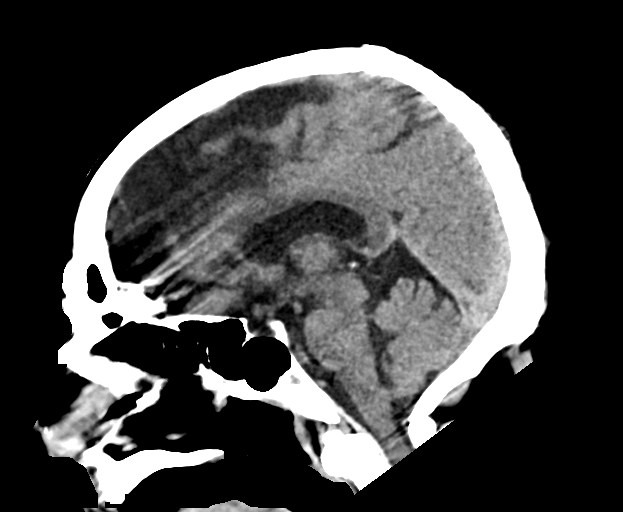
[im 41/61  brain]
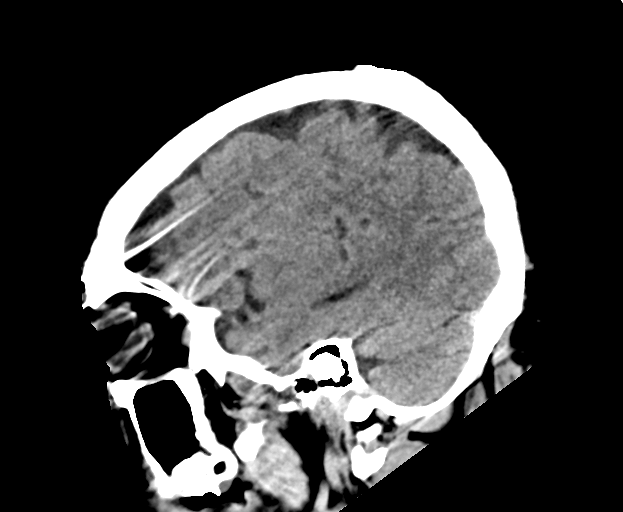

[16 of 37 positions shown; findings below may reference images not displayed]

FINDINGS: CT HEAD FINDINGS

Brain: Stable severe age advanced cerebral atrophy. The ventricles
are in the midline without mass effect or shift. They are normal in
size and configuration. No CT findings to suggest an acute
hemispheric infarction or intracranial hemorrhage. No mass lesions.
The brainstem and cerebellum are grossly normal.

Vascular: Stable mild vascular calcifications but no aneurysm or
hyperdense vessels.

Skull: No acute skull fracture or bone lesions.

Sinuses/Orbits: The paranasal sinuses and mastoid air cells are
grossly clear. The globes are intact.

Other: No scalp lesions or scalp hematoma.

CT CERVICAL SPINE FINDINGS

Alignment: Normal

Skull base and vertebrae: No acute fracture. No primary bone lesion
or focal pathologic process.

Soft tissues and spinal canal: No prevertebral fluid or swelling. No
visible canal hematoma.

Disc levels: Stable degenerative cervical spondylosis with
multilevel disc disease and facet disease. The spinal canal is
fairly generous in is no significant spinal stenosis. Mild
multilevel foraminal stenosis is noted.

There is a large left-sided bone spur at C5-6 with mass effect on
the left side of the thecal sac and likely effecting the left C6
nerve root. This appears stable when compared to the prior MR
examination.

Upper chest: The visualized lung apices are grossly clear.

Other: No neck mass or adenopathy.
IMPRESSION: 1. Stable severe age advanced cerebral atrophy but no acute
intracranial findings.
2. Stable degenerative cervical spondylosis with multilevel disc
disease and facet disease.
3. No acute cervical spine fracture or subluxation.

## 2022-06-08 IMAGING — CT CT CERVICAL SPINE W/O CM
3 series · 15 of 33 positions shown, 18 images · non-contrast
Comparison: Head CT 03/26/2021 and prior cervical spine MRI
03/14/2021

CLINICAL DATA: Found down.



[Series 5: c spine soft · axial · 0.35mm/px · z∈[+794,+954]mm · 7 of 96 slices shown, 9 images]
[im 8/96  soft-tissue]
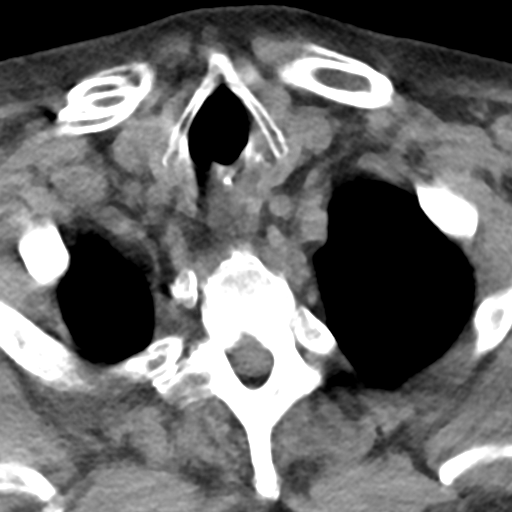
[im 8/96  bone]
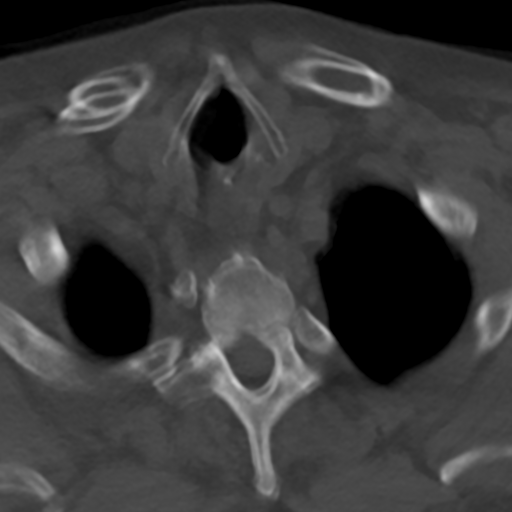
[im 22/96  bone]
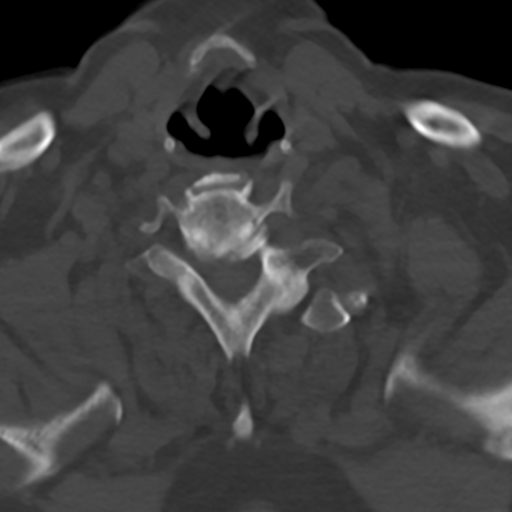
[im 37/96  bone]
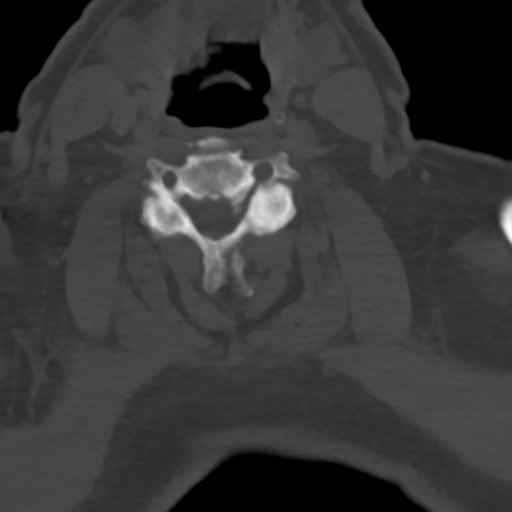
[im 52/96  bone]
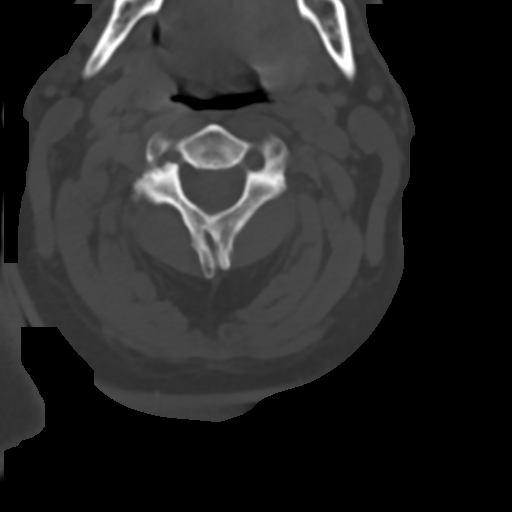
[im 59/96  soft-tissue]
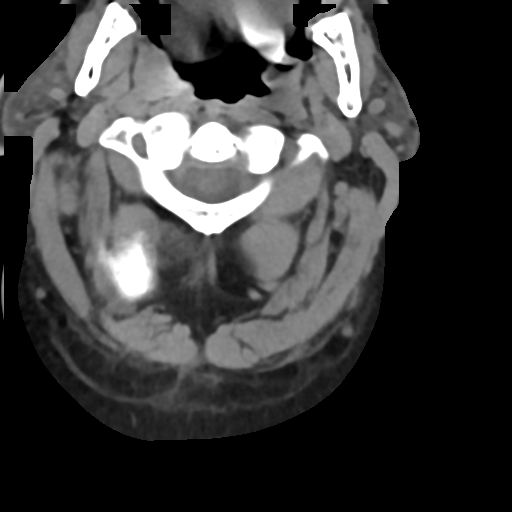
[im 59/96  bone]
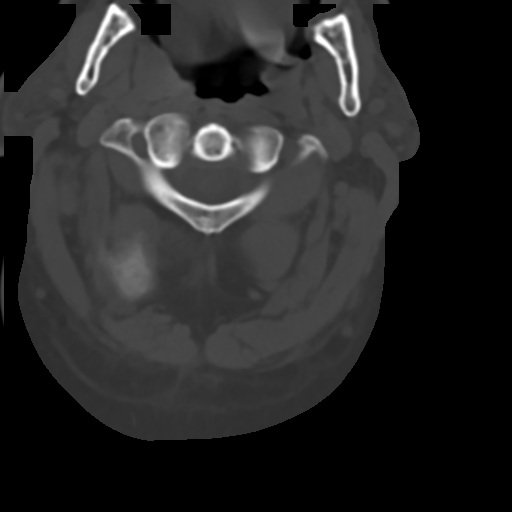
[im 74/96  bone]
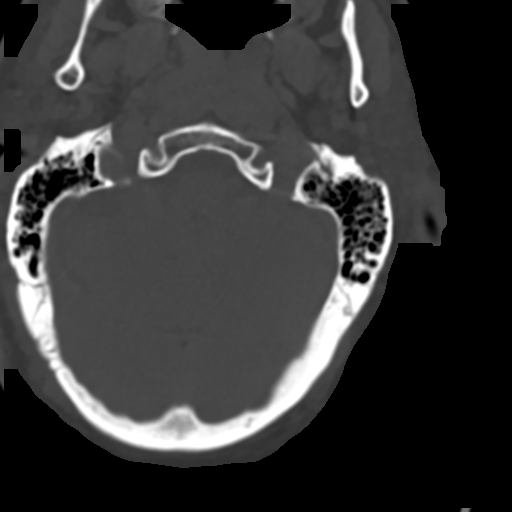
[im 88/96  bone]
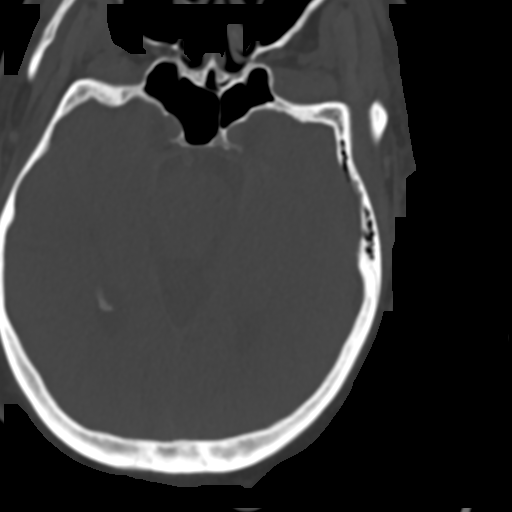

[Series 8: sag bone · sagittal · 0.34mm/px · 5 of 49 slices shown, 6 images]
[im 17/49  bone]
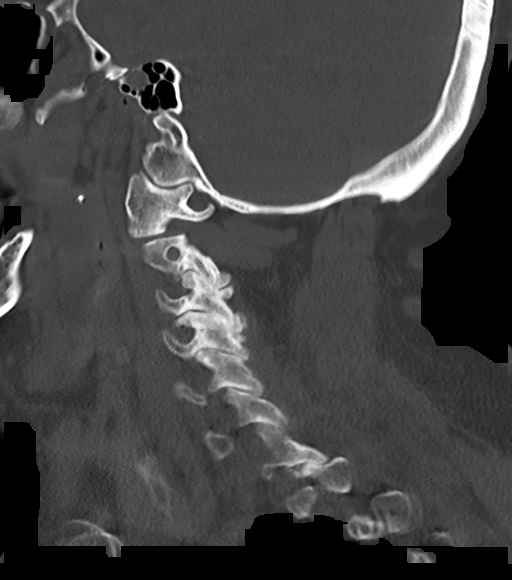
[im 21/49  bone]
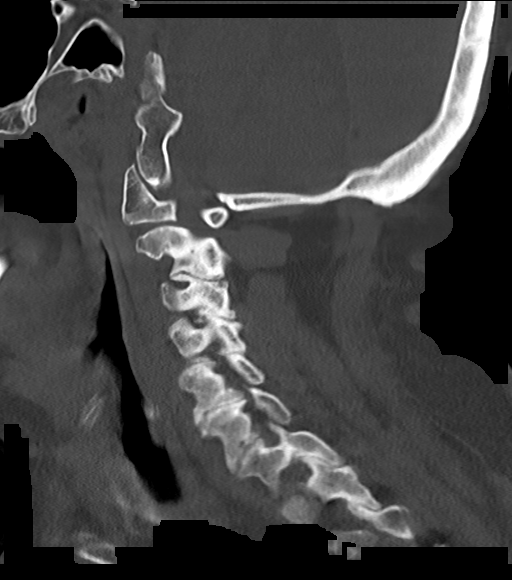
[im 25/49  soft-tissue]
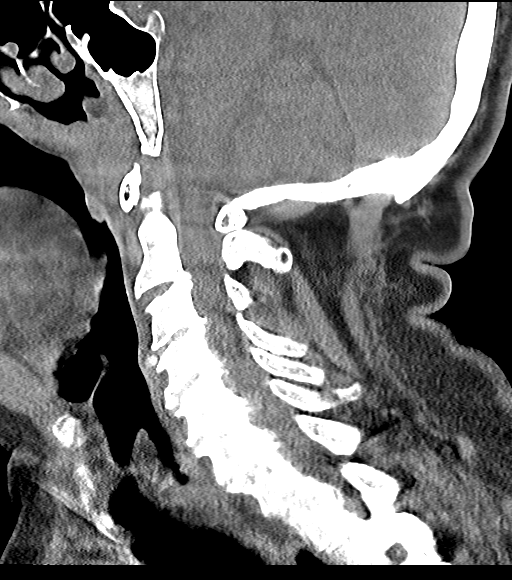
[im 25/49  bone]
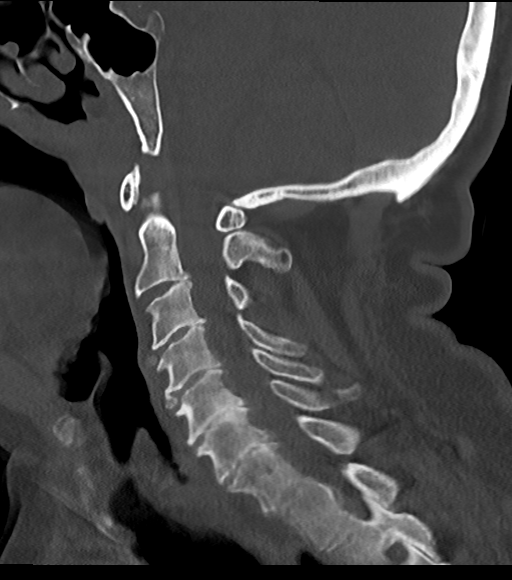
[im 29/49  bone]
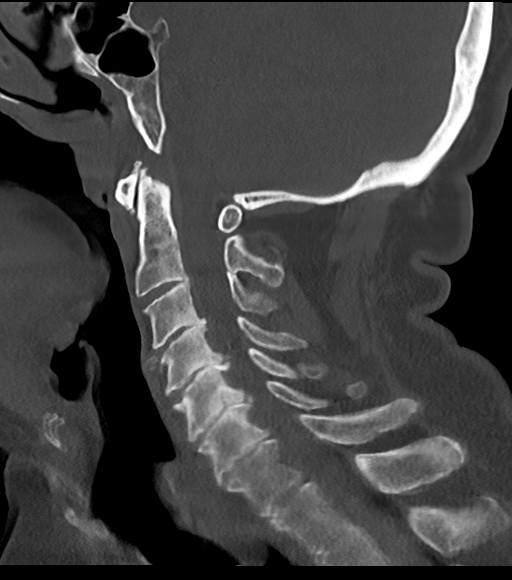
[im 33/49  bone]
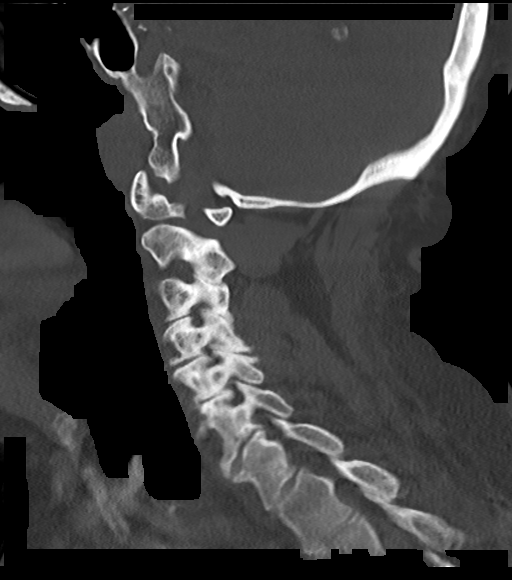

[Series 9: cor bone · coronal · 0.40mm/px · 3 of 76 slices shown]
[im 16/76  bone]
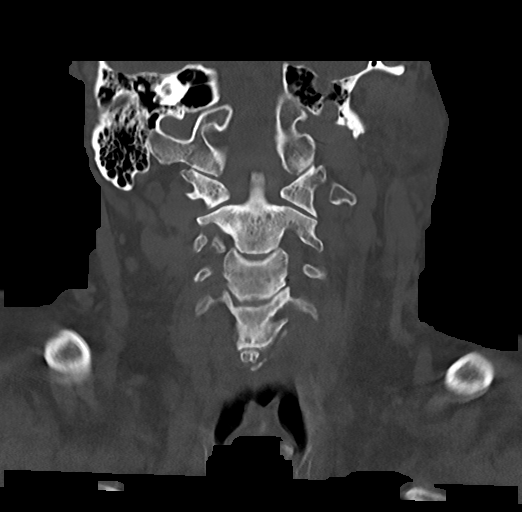
[im 31/76  bone]
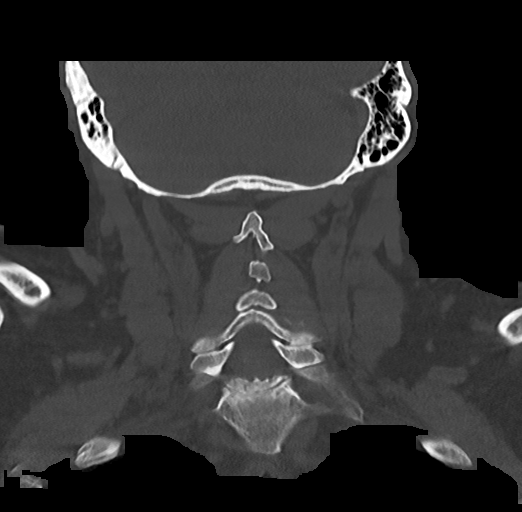
[im 46/76  bone]
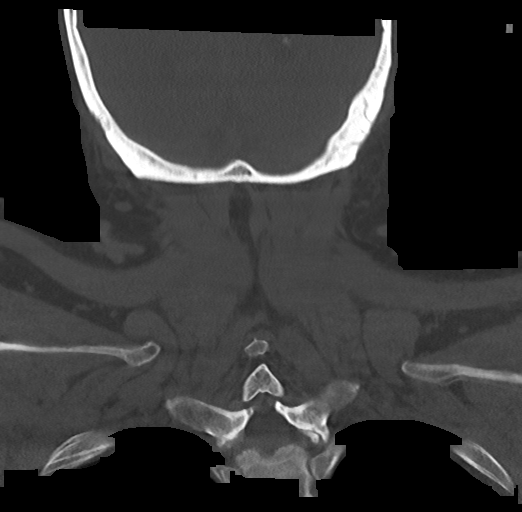

[15 of 33 positions shown; findings below may reference images not displayed]

FINDINGS: CT HEAD FINDINGS

Brain: Stable severe age advanced cerebral atrophy. The ventricles
are in the midline without mass effect or shift. They are normal in
size and configuration. No CT findings to suggest an acute
hemispheric infarction or intracranial hemorrhage. No mass lesions.
The brainstem and cerebellum are grossly normal.

Vascular: Stable mild vascular calcifications but no aneurysm or
hyperdense vessels.

Skull: No acute skull fracture or bone lesions.

Sinuses/Orbits: The paranasal sinuses and mastoid air cells are
grossly clear. The globes are intact.

Other: No scalp lesions or scalp hematoma.

CT CERVICAL SPINE FINDINGS

Alignment: Normal

Skull base and vertebrae: No acute fracture. No primary bone lesion
or focal pathologic process.

Soft tissues and spinal canal: No prevertebral fluid or swelling. No
visible canal hematoma.

Disc levels: Stable degenerative cervical spondylosis with
multilevel disc disease and facet disease. The spinal canal is
fairly generous in is no significant spinal stenosis. Mild
multilevel foraminal stenosis is noted.

There is a large left-sided bone spur at C5-6 with mass effect on
the left side of the thecal sac and likely effecting the left C6
nerve root. This appears stable when compared to the prior MR
examination.

Upper chest: The visualized lung apices are grossly clear.

Other: No neck mass or adenopathy.
IMPRESSION: 1. Stable severe age advanced cerebral atrophy but no acute
intracranial findings.
2. Stable degenerative cervical spondylosis with multilevel disc
disease and facet disease.
3. No acute cervical spine fracture or subluxation.

## 2022-06-09 NOTE — Progress Notes (Signed)
  Subjective:  Patient ID: Jeremy Shannon, male    DOB: July 07, 1956,  MRN: [id number]  Chief Complaint  Patient presents with   dermatitis    New Patient - dry, crusty skin between his toes. Not itchy or painful. Patient is also a diabetic. He has numbness in his feet. His wife made the appointment for him    66 y.o. male presents with the above complaint. History confirmed with patient.   Objective:  Physical Exam: warm, good capillary refill, no trophic changes or ulcerative lesions, normal DP and PT pulses, normal sensory exam, and  tinea pedis noted w/ lichen simplex on dorsal toes  Assessment:   1. Lichen simplex chronicus   2. Tinea pedis of both feet      Plan:  Patient was evaluated and treated and all questions answered.  Discussed the etiology and treatment options for tinea pedis.  Discussed topical and oral treatment.  Recommended topical treatment with lotrisone cream.  This was sent to the patient's pharmacy.  Also discussed appropriate foot hygiene, use of antifungal spray such as Tinactin in shoes, as well as cleaning her foot surfaces such as showers and bathroom floors with bleach.   Return if symptoms worsen or fail to improve.

## 2022-06-13 DIAGNOSIS — E1165 Type 2 diabetes mellitus with hyperglycemia: Secondary | ICD-10-CM | POA: Diagnosis not present

## 2022-06-13 DIAGNOSIS — Z87891 Personal history of nicotine dependence: Secondary | ICD-10-CM | POA: Diagnosis not present

## 2022-06-13 DIAGNOSIS — M545 Low back pain, unspecified: Secondary | ICD-10-CM | POA: Diagnosis not present

## 2022-06-13 DIAGNOSIS — Z1211 Encounter for screening for malignant neoplasm of colon: Secondary | ICD-10-CM | POA: Diagnosis not present

## 2022-06-13 DIAGNOSIS — F17210 Nicotine dependence, cigarettes, uncomplicated: Secondary | ICD-10-CM | POA: Diagnosis not present

## 2022-06-13 DIAGNOSIS — R5383 Other fatigue: Secondary | ICD-10-CM | POA: Diagnosis not present

## 2022-06-13 DIAGNOSIS — Z760 Encounter for issue of repeat prescription: Secondary | ICD-10-CM | POA: Diagnosis not present

## 2022-06-13 DIAGNOSIS — Z1159 Encounter for screening for other viral diseases: Secondary | ICD-10-CM | POA: Diagnosis not present

## 2022-06-13 DIAGNOSIS — Z0000 Encounter for general adult medical examination without abnormal findings: Secondary | ICD-10-CM | POA: Diagnosis not present

## 2022-06-13 DIAGNOSIS — R030 Elevated blood-pressure reading, without diagnosis of hypertension: Secondary | ICD-10-CM | POA: Diagnosis not present

## 2022-06-13 DIAGNOSIS — E559 Vitamin D deficiency, unspecified: Secondary | ICD-10-CM | POA: Diagnosis not present

## 2022-06-14 ENCOUNTER — Encounter: Payer: Self-pay | Admitting: Internal Medicine

## 2022-06-14 ENCOUNTER — Ambulatory Visit (INDEPENDENT_AMBULATORY_CARE_PROVIDER_SITE_OTHER): Payer: Medicare Other | Admitting: Internal Medicine

## 2022-06-14 ENCOUNTER — Ambulatory Visit (INDEPENDENT_AMBULATORY_CARE_PROVIDER_SITE_OTHER): Payer: Medicare Other

## 2022-06-14 VITALS — BP 126/72 | HR 86 | Temp 98.2°F | Ht 68.0 in | Wt 222.0 lb

## 2022-06-14 DIAGNOSIS — Z23 Encounter for immunization: Secondary | ICD-10-CM | POA: Diagnosis not present

## 2022-06-14 DIAGNOSIS — R9431 Abnormal electrocardiogram [ECG] [EKG]: Secondary | ICD-10-CM | POA: Diagnosis not present

## 2022-06-14 DIAGNOSIS — M545 Low back pain, unspecified: Secondary | ICD-10-CM | POA: Diagnosis not present

## 2022-06-14 DIAGNOSIS — F17200 Nicotine dependence, unspecified, uncomplicated: Secondary | ICD-10-CM

## 2022-06-14 DIAGNOSIS — E785 Hyperlipidemia, unspecified: Secondary | ICD-10-CM | POA: Diagnosis not present

## 2022-06-14 DIAGNOSIS — I10 Essential (primary) hypertension: Secondary | ICD-10-CM | POA: Diagnosis not present

## 2022-06-14 DIAGNOSIS — E118 Type 2 diabetes mellitus with unspecified complications: Secondary | ICD-10-CM

## 2022-06-14 MED ORDER — SIMVASTATIN 40 MG PO TABS
40.0000 mg | ORAL_TABLET | Freq: Every day | ORAL | 3 refills | Status: DC
Start: 2022-06-14 — End: 2022-12-15

## 2022-06-14 MED ORDER — TRAMADOL HCL 50 MG PO TABS: 50.0000 mg | ORAL_TABLET | Freq: Four times a day (QID) | ORAL | 0 refills | Status: DC | PRN

## 2022-06-14 NOTE — Assessment & Plan Note (Signed)
Lab Results  Component Value Date   HGBA1C 6.3 04/26/2022   Stable, pt to continue current medical treatment  - diet, wt control

## 2022-06-14 NOTE — Patient Instructions (Addendum)
Please take all new medication as prescribed - the tramadol as needed for pain  Ok to increase the simvastatin to 40 mg per day  Please stop smoking  Please continue all other medications as before, and refills have been done if requested.  Please have the pharmacy call with any other refills you may need.  Please continue your efforts at being more active, low cholesterol diet, and weight control.  You are otherwise up to date with prevention measures today.  Please keep your appointments with your specialists as you may have planned  You will be contacted regarding the referral for: Cardiac CT score  Please go to the XRAY Department in the first floor for the x-ray testing  You will be contacted by phone if any changes need to be made immediately.  Otherwise, you will receive a letter about your results with an explanation, but please check with MyChart first.  Please remember to sign up for MyChart if you have not done so, as this will be important to you in the future with finding out test results, communicating by private email, and scheduling acute appointments online when needed.

## 2022-06-14 NOTE — Assessment & Plan Note (Addendum)
Most recent LDL 84 - uncontrolled, goal ldl < 70, for increased simvastatin 40 mg qd; also for cardiac CT score

## 2022-06-14 NOTE — Assessment & Plan Note (Signed)
BP Readings from Last 3 Encounters:  06/14/22 126/72  04/26/22 114/84  01/17/22 95/69   Stable, pt to continue medical treatment - diet,wt control, excercise

## 2022-06-14 NOTE — Assessment & Plan Note (Signed)
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 ddd - for films, check UA , tramadol prn,  to f/u any worsening symptoms or concerns

## 2022-06-14 NOTE — Assessment & Plan Note (Signed)
Pt counsled to quit, pt not ready °

## 2022-06-14 NOTE — Progress Notes (Signed)
Patient ID: VINT POLA, male   DOB: June 12, 1956, 66 y.o.   MRN: [id number]        Chief Complaint: follow up low back pain, cor artery calcifications, hld, smoking       HPI:  Jeremy Shannon is a 66 y.o. male here with c/o 10 days onset lower back pain, primiarly midline, also pain to right lower lumbar area, wife with him concerned about UTI but Denies urinary symptoms such as dysuria, frequency, urgency, flank pain, hematuria or n/v, fever, chills.  Seen at [facility] yesterday and tx with mobic and flexeril prn but no help so quit this   also  Did have prior CT chest with mention of coronary calcificaitons.  Pt denies chest pain, increased sob or doe, wheezing, orthopnea, PND, increased LE swelling, palpitations, dizziness or syncope.  Goal ldl < 70, last ldl 84.  Pt still smoking, not ready to quit       Wt Readings from Last 3 Encounters:  06/14/22 222 lb (100.7 kg)  04/26/22 212 lb (96.2 kg)  12/29/21 209 lb (94.8 kg)   BP Readings from Last 3 Encounters:  06/14/22 126/72  04/26/22 114/84  01/17/22 95/69         Past Medical History:  Diagnosis Date   Anemia    pt denies   Anxiety    Cataract    Depression    Diabetes mellitus without complication ([place])    type 2   Dyspnea    Hyperlipidemia    PONV (postoperative nausea and vomiting)    Past Surgical History:  Procedure Laterality Date   BRONCHIAL BIOPSY  02/16/2021   Procedure: BRONCHIAL BIOPSIES;  Surgeon: Garner Nash, DO;  Location: [facility];  Service: Pulmonary;;   BRONCHIAL BRUSHINGS  02/16/2021   Procedure: BRONCHIAL BRUSHINGS;  Surgeon: Garner Nash, DO;  Location: [facility];  Service: Pulmonary;;   BRONCHIAL NEEDLE ASPIRATION BIOPSY  02/16/2021   Procedure: BRONCHIAL NEEDLE ASPIRATION BIOPSIES;  Surgeon: Garner Nash, DO;  Location: [facility];  Service: Pulmonary;;   BRONCHIAL WASHINGS  02/16/2021   Procedure: BRONCHIAL WASHINGS;  Surgeon: Garner Nash, DO;  Location: [facility];  Service:  Pulmonary;;   CATARACT EXTRACTION     bilateral   CHOLECYSTECTOMY N/A 02/10/2017   Procedure: LAPAROSCOPIC CHOLECYSTECTOMY WITH INTRAOPERATIVE CHOLANGIOGRAM;  Surgeon: Alphonsa Overall, MD;  Location: [facility];  Service: General;  Laterality: N/A;   COLONOSCOPY     FIDUCIAL MARKER PLACEMENT  02/16/2021   Procedure: FIDUCIAL MARKER PLACEMENT;  Surgeon: Garner Nash, DO;  Location: [facility];  Service: Pulmonary;;   VIDEO BRONCHOSCOPY WITH ENDOBRONCHIAL NAVIGATION Right 02/16/2021   Procedure: VIDEO BRONCHOSCOPY WITH ENDOBRONCHIAL NAVIGATION;  Surgeon: Garner Nash, DO;  Location: [place];  Service: Pulmonary;  Laterality: Right;    reports that he has been smoking cigarettes. He has a 43.00 pack-year smoking history. He has been exposed to tobacco smoke. He has never used smokeless tobacco. He reports that he does not drink alcohol and does not use drugs. family history includes Depression in his father; Drug abuse in his father; Liver cancer in his mother. No Known Allergies Current Outpatient Medications on File Prior to Visit  Medication Sig Dispense Refill   clotrimazole-betamethasone (LOTRISONE) cream Apply 1 Application topically 2 (two) times daily. 30 g 0   doxepin (SINEQUAN) 150 MG capsule Take 1 capsule (150 mg total) by mouth at bedtime. 30 capsule 3   FLUoxetine (PROZAC) 20 MG tablet Take 20 mg  by mouth daily. Morning     OLANZapine (ZYPREXA) 10 MG tablet Take 10 mg by mouth at bedtime. At bedtime     OLANZapine (ZYPREXA) 5 MG tablet Take 5 mg by mouth in the morning and at bedtime. Once in the morning and once at noon     No current facility-administered medications on file prior to visit.        ROS:  All others reviewed and negative.  Objective        PE:  BP 126/72 (BP Location: Right Arm, Patient Position: Sitting, Cuff Size: Large)   Pulse 86   Temp 98.2 F (36.8 C) (Oral)   Ht '5\' 8"'$  (1.727 m)   Wt 222 lb (100.7 kg)   SpO2 93%   BMI 33.75 kg/m                  Constitutional: Pt appears in NAD               HENT: Head: NCAT.                Right Ear: External ear normal.                 Left Ear: External ear normal.                Eyes: . Pupils are equal, round, and reactive to light. Conjunctivae and EOM are normal               Nose: without d/c or deformity               Neck: Neck supple. Gross normal ROM               Cardiovascular: Normal rate and regular rhythm.                 Pulmonary/Chest: Effort normal and breath sounds without rales or wheezing.                Abd:  Soft, NT, ND, + BS, no organomegaly              Spine nontender in midline, has mild right lumbar paravertebral spasm               Neurological: Pt is alert. At baseline orientation, motor grossly intact               Skin: Skin is warm. No rashes, no other new lesions, LE edema - none               Psychiatric: Pt behavior is normal without agitation   Micro: none  Cardiac tracings I have personally interpreted today:  none  Pertinent Radiological findings (summarize): none   Lab Results  Component Value Date   WBC 10.2 01/13/2022   HGB 16.9 01/13/2022   HCT 48.4 01/13/2022   PLT 263 01/13/2022   GLUCOSE 86 04/26/2022   CHOL 149 11/11/2021   TRIG 217.0 (H) 11/11/2021   HDL 47.40 11/11/2021   LDLDIRECT 70.0 11/11/2021   ALT 22 01/13/2022   AST 17 01/13/2022   NA 140 04/26/2022   K 4.8 04/26/2022   CL 105 04/26/2022   CREATININE 1.15 04/26/2022   BUN 21 04/26/2022   CO2 28 04/26/2022   TSH 3.09 11/11/2021   PSA 0.42 11/11/2021   HGBA1C 6.3 04/26/2022   MICROALBUR 1.1 11/11/2021   Assessment/Plan:  Jeremy Shannon is a 66 y.o. White or Caucasian [1] male  with  has a past medical history of Anemia, Anxiety, Cataract, Depression, Diabetes mellitus without complication ([place]), Dyspnea, Hyperlipidemia, and PONV (postoperative nausea and vomiting).  Type II diabetes mellitus with complication ([facility]) Lab Results  Component Value Date   HGBA1C 6.3  04/26/2022   Stable, pt to continue current medical treatment  - diet, wt control   Hyperlipidemia with target LDL less than 100 Most recent LDL 84 - uncontrolled, goal ldl < 70, for increased simvastatin 40 mg qd; also for cardiac CT score  HTN (hypertension) BP Readings from Last 3 Encounters:  06/14/22 126/72  04/26/22 114/84  01/17/22 95/69   Stable, pt to continue medical treatment - diet,wt control, excercise   Smoker Pt counsled to quit, pt not ready  Acute midline low back pain without sciatica Most likely flare of undelrying lumbar djd. ddd - for films, check UA , tramadol prn,  to f/u any worsening symptoms or concerns  Followup: Return if symptoms worsen or fail to improve.  Cathlean Cower, MD 06/14/2022 9:10 PM [facility]

## 2022-06-21 ENCOUNTER — Ambulatory Visit (INDEPENDENT_AMBULATORY_CARE_PROVIDER_SITE_OTHER): Payer: Medicare Other | Admitting: Emergency Medicine

## 2022-06-21 ENCOUNTER — Encounter: Payer: Self-pay | Admitting: Emergency Medicine

## 2022-06-21 VITALS — BP 118/68 | HR 91 | Temp 97.7°F | Ht 68.0 in | Wt 221.2 lb

## 2022-06-21 DIAGNOSIS — G44011 Episodic cluster headache, intractable: Secondary | ICD-10-CM | POA: Insufficient documentation

## 2022-06-21 MED ORDER — SUMATRIPTAN SUCCINATE 100 MG PO TABS: 100.0000 mg | ORAL_TABLET | Freq: Once | ORAL | 1 refills | Status: DC

## 2022-06-21 NOTE — Assessment & Plan Note (Signed)
Clinically stable.  No red flag signs or symptoms. Behaving like typical cluster headache. Normal neurological exam. Imitrex 100 mg prescribed. ED precautions given. Advised to contact the office if no better or worse during the next several days.

## 2022-06-21 NOTE — Progress Notes (Signed)
Jeremy Shannon 66 y.o.   Chief Complaint  Patient presents with   Migraine    Patient has cluster headaches     HISTORY OF PRESENT ILLNESS: Acute problem visit today.  Patient of Dr. Scarlette Calico. This is a 66 y.o. male complaining of typical cluster headache that he normally gets once a year. States Imitrex always helps.  Needs prescription. No other associated symptoms.  Right-sided headache mostly periorbital with slight nausea but no vomiting. No other complaints or medical concerns today.  Migraine  Associated symptoms include nausea. Pertinent negatives include no coughing, fever, seizures, sore throat or vomiting.     Prior to Admission medications   Medication Sig Start Date End Date Taking? Authorizing Provider  clotrimazole-betamethasone (LOTRISONE) cream Apply 1 Application topically 2 (two) times daily. 06/07/22  Yes McDonald, Adam R, DPM  doxepin (SINEQUAN) 150 MG capsule Take 1 capsule (150 mg total) by mouth at bedtime. 01/12/22  Yes Parks Ranger, DO  FLUoxetine (PROZAC) 20 MG capsule Take 20 mg by mouth daily. 06/16/22  Yes [provider]  FLUoxetine (PROZAC) 20 MG tablet Take 20 mg by mouth daily. Morning   Yes [provider]  lamoTRIgine (LAMICTAL) 25 MG tablet Take 50 mg by mouth daily. 06/16/22  Yes [provider]  OLANZapine (ZYPREXA) 10 MG tablet Take 10 mg by mouth at bedtime. At bedtime   Yes [provider]  OLANZapine (ZYPREXA) 5 MG tablet Take 5 mg by mouth in the morning and at bedtime. Once in the morning and once at noon   Yes [provider]  simvastatin (ZOCOR) 40 MG tablet Take 1 tablet (40 mg total) by mouth at bedtime. 06/14/22  Yes Biagio Borg, MD  SUMAtriptan (IMITREX) 100 MG tablet Take 1 tablet (100 mg total) by mouth once for 1 dose. May repeat in 2 hours if headache persists or recurs.  Do not exceed 200 mg in 24 hours. 06/21/22 06/21/22 Yes Xzander Gilham, Ines Bloomer, MD  traMADol (ULTRAM)  50 MG tablet Take 1 tablet (50 mg total) by mouth every 6 (six) hours as needed. 06/14/22  Yes Biagio Borg, MD    No Known Allergies  Patient Active Problem List   Diagnosis Date Noted   Smoker 06/14/2022   Acute midline low back pain without sciatica 06/14/2022   Hyperkeratosis of sole 04/26/2022   Benzodiazepine dependence ([place]) 01/14/2022   Major depressive disorder, recurrent severe without psychotic features ([place]) 12/31/2021   Need for hepatitis C screening test 11/11/2021   Tachycardia 11/11/2021   HTN (hypertension) 03/27/2021   Parkinsonian features 03/27/2021   Need for DTP vaccine 11/15/2020   Pure hyperglyceridemia 11/12/2020   Neuropathy 11/11/2020   Type II diabetes mellitus with complication ([place]) 88/41/6606   Hyperlipidemia with target LDL less than 100 11/11/2020   Encounter for general adult medical examination with abnormal findings 11/11/2020   Loud snoring 11/11/2020   Simple chronic bronchitis ([place]) 11/11/2020   Smokers' cough ([place]) 12/17/2007   COLONIC POLYPS 05/07/2007    Past Medical History:  Diagnosis Date   Anemia    pt denies   Anxiety    Cataract    Depression    Diabetes mellitus without complication ([place])    type 2   Dyspnea    Hyperlipidemia    PONV (postoperative nausea and vomiting)     Past Surgical History:  Procedure Laterality Date   BRONCHIAL BIOPSY  02/16/2021   Procedure: BRONCHIAL BIOPSIES;  Surgeon: Garner Nash,  DO;  Location: [facility];  Service: Pulmonary;;   BRONCHIAL BRUSHINGS  02/16/2021   Procedure: BRONCHIAL BRUSHINGS;  Surgeon: Garner Nash, DO;  Location: [facility];  Service: Pulmonary;;   BRONCHIAL NEEDLE ASPIRATION BIOPSY  02/16/2021   Procedure: BRONCHIAL NEEDLE ASPIRATION BIOPSIES;  Surgeon: Garner Nash, DO;  Location: [facility];  Service: Pulmonary;;   BRONCHIAL WASHINGS  02/16/2021   Procedure: BRONCHIAL WASHINGS;  Surgeon: Garner Nash, DO;  Location: [facility];  Service: Pulmonary;;    CATARACT EXTRACTION     bilateral   CHOLECYSTECTOMY N/A 02/10/2017   Procedure: LAPAROSCOPIC CHOLECYSTECTOMY WITH INTRAOPERATIVE CHOLANGIOGRAM;  Surgeon: Alphonsa Overall, MD;  Location: [facility];  Service: General;  Laterality: N/A;   COLONOSCOPY     FIDUCIAL MARKER PLACEMENT  02/16/2021   Procedure: FIDUCIAL MARKER PLACEMENT;  Surgeon: Garner Nash, DO;  Location: [facility];  Service: Pulmonary;;   VIDEO BRONCHOSCOPY WITH ENDOBRONCHIAL NAVIGATION Right 02/16/2021   Procedure: VIDEO BRONCHOSCOPY WITH ENDOBRONCHIAL NAVIGATION;  Surgeon: Garner Nash, DO;  Location: [facility];  Service: Pulmonary;  Laterality: Right;    Social History   Socioeconomic History   Marital status: Married    Spouse name: Not on file   Number of children: Not on file   Years of education: Not on file   Highest education level: Not on file  Occupational History   Not on file  Tobacco Use   Smoking status: Every Day    Packs/day: 1.00    Years: 43.00    Total pack years: 43.00    Types: Cigarettes    Passive exposure: Current   Smokeless tobacco: Never   Tobacco comments:    10-20 cigarettes smoked daily 02/17/2021  Vaping Use   Vaping Use: Never used  Substance and Sexual Activity   Alcohol use: No   Drug use: No   Sexual activity: Yes    Partners: Female    Birth control/protection: None  Other Topics Concern   Not on file  Social History Narrative   Patient is currently a smoker and states he is not ready to quit   Social Determinants of Health   Financial Resource Strain: Low Risk  (04/25/2022)   Overall Financial Resource Strain (CARDIA)    Difficulty of Paying Living Expenses: Not hard at all  Food Insecurity: No Food Insecurity (04/25/2022)   Hunger Vital Sign    Worried About Running Out of Food in the Last Year: Never true    [place] in the Last Year: Never true  Transportation Needs: No Transportation Needs (04/25/2022)   PRAPARE - Radiographer, therapeutic (Medical): No    Lack of Transportation (Non-Medical): No  Physical Activity: Insufficiently Active (04/25/2022)   Exercise Vital Sign    Days of Exercise per Week: 2 days    Minutes of Exercise per Session: 20 min  Stress: No Stress Concern Present (04/25/2022)   [place]    Feeling of Stress : Not at all  Social Connections: Moderately Integrated (04/25/2022)   Social Connection and Isolation Panel [NHANES]    Frequency of Communication with Friends and Family: Three times a week    Frequency of Social Gatherings with Friends and Family: Three times a week    Attends Religious Services: More than 4 times per year    Active Member of Clubs or Organizations: No    Attends Archivist Meetings: Never  Marital Status: Married  Human resources officer Violence: Not At Risk (04/25/2022)   Humiliation, Afraid, Rape, and Kick questionnaire    Fear of Current or Ex-Partner: No    Emotionally Abused: No    Physically Abused: No    Sexually Abused: No    Family History  Problem Relation Age of Onset   Liver cancer Mother    Depression Father    Drug abuse Father      Review of Systems  Constitutional: Negative.  Negative for chills and fever.  HENT: Negative.  Negative for congestion and sore throat.   Respiratory: Negative.  Negative for cough and shortness of breath.   Cardiovascular: Negative.  Negative for chest pain and palpitations.  Gastrointestinal:  Positive for nausea. Negative for vomiting.  Genitourinary: Negative.   Skin: Negative.  Negative for rash.  Neurological:  Positive for headaches. Negative for sensory change, speech change, focal weakness, seizures and loss of consciousness.  All other systems reviewed and are negative.  Today's Vitals   06/21/22 1544  BP: 118/68  Pulse: 91  Temp: 97.7 F (36.5 C)  TempSrc: Oral  SpO2: 93%  Weight: 221 lb 4 oz (100.4 kg)  Height: '5\' 8"'$   (1.727 m)   Body mass index is 33.64 kg/m.   Physical Exam Vitals reviewed.  Constitutional:      Appearance: Normal appearance.  HENT:     Head: Normocephalic.     Mouth/Throat:     Mouth: Mucous membranes are moist.     Pharynx: Oropharynx is clear.  Eyes:     Extraocular Movements: Extraocular movements intact.     Conjunctiva/sclera: Conjunctivae normal.     Pupils: Pupils are equal, round, and reactive to light.  Cardiovascular:     Rate and Rhythm: Normal rate and regular rhythm.     Pulses: Normal pulses.     Heart sounds: Normal heart sounds.  Pulmonary:     Effort: Pulmonary effort is normal.     Breath sounds: Normal breath sounds.  Musculoskeletal:     Cervical back: No tenderness.  Lymphadenopathy:     Cervical: No cervical adenopathy.  Skin:    General: Skin is warm and dry.  Neurological:     General: No focal deficit present.     Mental Status: He is alert and oriented to person, place, and time.     Cranial Nerves: No cranial nerve deficit.     Sensory: No sensory deficit.     Motor: No weakness.     Gait: Gait normal.  Psychiatric:        Mood and Affect: Mood normal.        Behavior: Behavior normal.      ASSESSMENT & PLAN: A total of 35 minutes was spent with the patient and counseling/coordination of care regarding preparing for this visit, review of available medical records, review of multiple chronic medical problems and their management, review of all medications, diagnosis of cluster headache and management, prognosis, documentation, need for follow-up.  Problem List Items Addressed This Visit       Nervous and Auditory   Intractable episodic cluster headache - Primary    Clinically stable.  No red flag signs or symptoms. Behaving like typical cluster headache. Normal neurological exam. Imitrex 100 mg prescribed. ED precautions given. Advised to contact the office if no better or worse during the next several days.      Relevant  Medications   FLUoxetine (PROZAC) 20 MG capsule   lamoTRIgine (LAMICTAL)  25 MG tablet   SUMAtriptan (IMITREX) 100 MG tablet   Patient Instructions  Cluster Headache Cluster headaches hurt a lot. They normally happen on one side of your head, but they may switch sides. Often, cluster headaches: Cause a lot of pain. Happen for weeks to months. Last from 15 minutes to 3 hours. Happen at the same time each day. Happen at night. Happen many times a day. Happen more often in the fall and springtime. What are the causes? The exact cause is not known. They are not usually caused by foods, changes in body chemicals (hormonal changes), or stress. What increases the risk? Being a male between the ages of 32-69 years old. Smoking or using products that contain nicotine or tobacco. Having elevated levels of body chemical called histamine. This can happen in people who have allergies. Taking certain medicines that cause blood vessels to expand. Having a parent or brother or sister who has cluster headaches. What are the signs or symptoms? Very bad pain on one side of the head that begins behind or around your eye but may spread to your face, head, and neck. Feeling like you may vomit (nauseous). Being sensitive to light. Runny nose and stuffy nose. Swelling of the forehead or face on the affected side. Eye problems. This might include a droopy or swollen eyelid, eye redness, or tearing on the affected side. Feeling restless or upset. Pale skin or a flushed face. How is this treated? Medicines. Oxygen that is breathed in through a mask. Follow these instructions at home: Headache diary Keep a headache diary as told by your doctor. Doing this can help you and your doctor figure out what triggers your headaches. In your headache diary, include information about: The time of day that your headache started and what you were doing when it began. How long your headache lasted. Where your pain  started and whether it moved to other areas. The type of pain. Your level of pain. Use a pain scale and rate the pain with a number from 1 (mild) up to 10 (very bad). The treatment that you used, and any change in symptoms after treatment.  Medicines Take over-the-counter and prescription medicines only as told by your health care provider. Ask your doctor if the medicine prescribed to you: Requires you to avoid driving or using machinery. Can cause trouble pooping (constipation). You may need to take these actions to prevent or treat trouble pooping: Drink enough fluid to keep your pee (urine) pale yellow. Take over-the-counter or prescription medicines. Eat foods that are high in fiber. These include beans, whole grains, and fresh fruits and vegetables. Limit foods that are high in fat and processed sugars. These include fried or sweet foods. Lifestyle  Go to bed at the same time each night. Get the same amount of sleep every night. Get 7-9 hours of sleep each night, or the amount recommended by your doctor. Limit or manage stress. Exercise regularly. Exercise for at least 30 minutes, 5 times each week. Eat a healthy diet. Avoid any foods that you know may trigger your headaches. Do not drink alcohol. Do not use any products that contain nicotine or tobacco, such as cigarettes, e-cigarettes, and chewing tobacco. If you need help quitting, ask your doctor. General instructions Use oxygen as told by your doctor. Keep all follow-up visits as told by your health care provider. This is important. Contact a doctor if: Your headaches: Change. Get worse. Happen more often. Your medicines or oxygen are not  helping. Get help right away if: You faint. You get weak or lose feeling (have numbness) on one side of your body or face. You see two of everything (double vision). You feel you may vomit or you vomit and it does stop after many hours. You have trouble with your balance or with  walking. You have trouble talking. You have neck pain or stiffness and you have a fever. Summary Cluster headaches hurt a lot. Keep a headache diary. Do not drink alcohol. Medicines and oxygen may help you feel better. This information is not intended to replace advice given to you by your health care provider. Make sure you discuss any questions you have with your health care provider. Document Revised: 05/01/2020 Document Reviewed: 10/03/2019 Elsevier Patient Education  [place], MD [facility]

## 2022-06-21 NOTE — Patient Instructions (Signed)
Cluster Headache Cluster headaches hurt a lot. They normally happen on one side of your head, but they may switch sides. Often, cluster headaches: Cause a lot of pain. Happen for weeks to months. Last from 15 minutes to 3 hours. Happen at the same time each day. Happen at night. Happen many times a day. Happen more often in the fall and springtime. What are the causes? The exact cause is not known. They are not usually caused by foods, changes in body chemicals (hormonal changes), or stress. What increases the risk? Being a male between the ages of 60-2 years old. Smoking or using products that contain nicotine or tobacco. Having elevated levels of body chemical called histamine. This can happen in people who have allergies. Taking certain medicines that cause blood vessels to expand. Having a parent or brother or sister who has cluster headaches. What are the signs or symptoms? Very bad pain on one side of the head that begins behind or around your eye but may spread to your face, head, and neck. Feeling like you may vomit (nauseous). Being sensitive to light. Runny nose and stuffy nose. Swelling of the forehead or face on the affected side. Eye problems. This might include a droopy or swollen eyelid, eye redness, or tearing on the affected side. Feeling restless or upset. Pale skin or a flushed face. How is this treated? Medicines. Oxygen that is breathed in through a mask. Follow these instructions at home: Headache diary Keep a headache diary as told by your doctor. Doing this can help you and your doctor figure out what triggers your headaches. In your headache diary, include information about: The time of day that your headache started and what you were doing when it began. How long your headache lasted. Where your pain started and whether it moved to other areas. The type of pain. Your level of pain. Use a pain scale and rate the pain with a number from 1 (mild) up to 10  (very bad). The treatment that you used, and any change in symptoms after treatment.  Medicines Take over-the-counter and prescription medicines only as told by your health care provider. Ask your doctor if the medicine prescribed to you: Requires you to avoid driving or using machinery. Can cause trouble pooping (constipation). You may need to take these actions to prevent or treat trouble pooping: Drink enough fluid to keep your pee (urine) pale yellow. Take over-the-counter or prescription medicines. Eat foods that are high in fiber. These include beans, whole grains, and fresh fruits and vegetables. Limit foods that are high in fat and processed sugars. These include fried or sweet foods. Lifestyle  Go to bed at the same time each night. Get the same amount of sleep every night. Get 7-9 hours of sleep each night, or the amount recommended by your doctor. Limit or manage stress. Exercise regularly. Exercise for at least 30 minutes, 5 times each week. Eat a healthy diet. Avoid any foods that you know may trigger your headaches. Do not drink alcohol. Do not use any products that contain nicotine or tobacco, such as cigarettes, e-cigarettes, and chewing tobacco. If you need help quitting, ask your doctor. General instructions Use oxygen as told by your doctor. Keep all follow-up visits as told by your health care provider. This is important. Contact a doctor if: Your headaches: Change. Get worse. Happen more often. Your medicines or oxygen are not helping. Get help right away if: You faint. You get weak or lose feeling (have  numbness) on one side of your body or face. You see two of everything (double vision). You feel you may vomit or you vomit and it does stop after many hours. You have trouble with your balance or with walking. You have trouble talking. You have neck pain or stiffness and you have a fever. Summary Cluster headaches hurt a lot. Keep a headache diary. Do not  drink alcohol. Medicines and oxygen may help you feel better. This information is not intended to replace advice given to you by your health care provider. Make sure you discuss any questions you have with your health care provider. Document Revised: 05/01/2020 Document Reviewed: 10/03/2019 Elsevier Patient Education  [place].

## 2022-06-28 ENCOUNTER — Other Ambulatory Visit (HOSPITAL_BASED_OUTPATIENT_CLINIC_OR_DEPARTMENT_OTHER): Payer: Self-pay

## 2022-06-28 MED ORDER — COMIRNATY 30 MCG/0.3ML IM SUSY
PREFILLED_SYRINGE | INTRAMUSCULAR | 0 refills | Status: DC
Start: 2022-06-28 — End: 2022-12-15
  Filled 2022-06-28: qty 0.3, 1d supply, fill #0

## 2022-07-18 DIAGNOSIS — M4802 Spinal stenosis, cervical region: Secondary | ICD-10-CM | POA: Diagnosis not present

## 2022-07-18 DIAGNOSIS — R413 Other amnesia: Secondary | ICD-10-CM | POA: Diagnosis not present

## 2022-07-18 DIAGNOSIS — G629 Polyneuropathy, unspecified: Secondary | ICD-10-CM | POA: Diagnosis not present

## 2022-07-18 DIAGNOSIS — G44019 Episodic cluster headache, not intractable: Secondary | ICD-10-CM | POA: Diagnosis not present

## 2022-07-22 ENCOUNTER — Ambulatory Visit (HOSPITAL_BASED_OUTPATIENT_CLINIC_OR_DEPARTMENT_OTHER)
Admission: RE | Admit: 2022-07-22 | Discharge: 2022-07-22 | Disposition: A | Discharge status: Home / Self Care | Payer: Medicare Other | Source: Ambulatory Visit | Attending: Internal Medicine | Admitting: Internal Medicine

## 2022-07-22 DIAGNOSIS — I10 Essential (primary) hypertension: Secondary | ICD-10-CM | POA: Insufficient documentation

## 2022-07-22 DIAGNOSIS — R9431 Abnormal electrocardiogram [ECG] [EKG]: Secondary | ICD-10-CM | POA: Insufficient documentation

## 2022-07-22 DIAGNOSIS — E118 Type 2 diabetes mellitus with unspecified complications: Secondary | ICD-10-CM | POA: Insufficient documentation

## 2022-07-22 DIAGNOSIS — F17200 Nicotine dependence, unspecified, uncomplicated: Secondary | ICD-10-CM | POA: Insufficient documentation

## 2022-07-27 ENCOUNTER — Telehealth: Payer: Self-pay | Admitting: Internal Medicine

## 2022-07-27 NOTE — Telephone Encounter (Signed)
Patient called back about message below:   Jeremy Shannon, [facility] 07/26/2022  4:08 PM EST     Left message for a call back regarding results   Biagio Borg, MD 07/25/2022 12:59 PM EST     The test results show that your current treatment is OK, except the Cardiac CT score does show mild coronary artery disease.   Please start Aspirin 81 mg (such as OTC Ecotrin 81 mg per day), and continue the simvastatin to reduce the risk of getting worse.   Staff to please inform pt to start asa 81 mg qd   I let him know what Dr Judi Cong message said and he didn't have any further questions and said he will go get some aspirin '81mg'$  to take

## 2022-08-12 ENCOUNTER — Telehealth: Payer: Self-pay | Admitting: Internal Medicine

## 2022-08-12 NOTE — Telephone Encounter (Signed)
Left message for patient to call back to schedule Medicare Annual Wellness Visit   No hx of AWV eligible as of 11/11/18  Please schedule at anytime with [facility] Advisor if patient calls the office back.     Any questions, please call me at [phone]

## 2022-09-20 DIAGNOSIS — G3184 Mild cognitive impairment, so stated: Secondary | ICD-10-CM | POA: Diagnosis not present

## 2022-09-26 ENCOUNTER — Other Ambulatory Visit: Payer: Self-pay

## 2022-10-13 ENCOUNTER — Other Ambulatory Visit: Payer: Self-pay

## 2022-10-13 DIAGNOSIS — F17210 Nicotine dependence, cigarettes, uncomplicated: Secondary | ICD-10-CM

## 2022-10-13 DIAGNOSIS — Z87891 Personal history of nicotine dependence: Secondary | ICD-10-CM

## 2022-10-27 ENCOUNTER — Ambulatory Visit: Payer: Medicare Other | Admitting: Internal Medicine

## 2022-10-31 ENCOUNTER — Ambulatory Visit (HOSPITAL_COMMUNITY): Payer: Medicare Other

## 2022-11-02 ENCOUNTER — Ambulatory Visit (HOSPITAL_COMMUNITY): Payer: Medicare Other

## 2022-11-11 ENCOUNTER — Ambulatory Visit (HOSPITAL_COMMUNITY)
Admission: RE | Admit: 2022-11-11 | Discharge: 2022-11-11 | Disposition: A | Discharge status: Home / Self Care | Payer: Medicare Other | Source: Ambulatory Visit | Attending: Acute Care | Admitting: Acute Care

## 2022-11-11 DIAGNOSIS — Z87891 Personal history of nicotine dependence: Secondary | ICD-10-CM

## 2022-11-11 DIAGNOSIS — F17210 Nicotine dependence, cigarettes, uncomplicated: Secondary | ICD-10-CM

## 2022-11-14 ENCOUNTER — Other Ambulatory Visit: Payer: Self-pay | Admitting: Acute Care

## 2022-11-14 DIAGNOSIS — Z122 Encounter for screening for malignant neoplasm of respiratory organs: Secondary | ICD-10-CM

## 2022-11-14 DIAGNOSIS — F17210 Nicotine dependence, cigarettes, uncomplicated: Secondary | ICD-10-CM

## 2022-11-14 DIAGNOSIS — Z87891 Personal history of nicotine dependence: Secondary | ICD-10-CM

## 2022-11-16 DIAGNOSIS — M4802 Spinal stenosis, cervical region: Secondary | ICD-10-CM | POA: Diagnosis not present

## 2022-11-16 DIAGNOSIS — R413 Other amnesia: Secondary | ICD-10-CM | POA: Diagnosis not present

## 2022-11-17 DIAGNOSIS — R413 Other amnesia: Secondary | ICD-10-CM | POA: Diagnosis not present

## 2022-11-21 ENCOUNTER — Ambulatory Visit: Payer: Medicare Other | Admitting: Internal Medicine

## 2022-11-29 ENCOUNTER — Ambulatory Visit: Payer: Medicare Other | Admitting: Internal Medicine

## 2022-11-30 LAB — HM DIABETES EYE EXAM

## 2022-12-01 DIAGNOSIS — G3184 Mild cognitive impairment, so stated: Secondary | ICD-10-CM | POA: Insufficient documentation

## 2022-12-01 DIAGNOSIS — F3110 Bipolar disorder, current episode manic without psychotic features, unspecified: Secondary | ICD-10-CM | POA: Insufficient documentation

## 2022-12-08 ENCOUNTER — Ambulatory Visit: Payer: Medicare Other | Admitting: Internal Medicine

## 2022-12-08 DIAGNOSIS — F4011 Social phobia, generalized: Secondary | ICD-10-CM | POA: Insufficient documentation

## 2022-12-08 DIAGNOSIS — F13129 Sedative, hypnotic or anxiolytic abuse with intoxication, unspecified: Secondary | ICD-10-CM | POA: Insufficient documentation

## 2022-12-08 DIAGNOSIS — F3181 Bipolar II disorder: Secondary | ICD-10-CM | POA: Insufficient documentation

## 2022-12-08 DIAGNOSIS — Z79899 Other long term (current) drug therapy: Secondary | ICD-10-CM | POA: Insufficient documentation

## 2022-12-15 ENCOUNTER — Encounter: Payer: Self-pay | Admitting: Internal Medicine

## 2022-12-15 ENCOUNTER — Ambulatory Visit (INDEPENDENT_AMBULATORY_CARE_PROVIDER_SITE_OTHER): Payer: Medicare Other | Admitting: Internal Medicine

## 2022-12-15 VITALS — BP 124/86 | HR 85 | Temp 98.0°F | Resp 16 | Ht 68.0 in | Wt 225.0 lb

## 2022-12-15 DIAGNOSIS — L859 Epidermal thickening, unspecified: Secondary | ICD-10-CM | POA: Insufficient documentation

## 2022-12-15 DIAGNOSIS — I10 Essential (primary) hypertension: Secondary | ICD-10-CM

## 2022-12-15 DIAGNOSIS — E785 Hyperlipidemia, unspecified: Secondary | ICD-10-CM

## 2022-12-15 DIAGNOSIS — Z0001 Encounter for general adult medical examination with abnormal findings: Secondary | ICD-10-CM | POA: Diagnosis not present

## 2022-12-15 DIAGNOSIS — J431 Panlobular emphysema: Secondary | ICD-10-CM

## 2022-12-15 DIAGNOSIS — E118 Type 2 diabetes mellitus with unspecified complications: Secondary | ICD-10-CM | POA: Diagnosis not present

## 2022-12-15 DIAGNOSIS — I700 Atherosclerosis of aorta: Secondary | ICD-10-CM

## 2022-12-15 DIAGNOSIS — E781 Pure hyperglyceridemia: Secondary | ICD-10-CM | POA: Diagnosis not present

## 2022-12-15 DIAGNOSIS — Z125 Encounter for screening for malignant neoplasm of prostate: Secondary | ICD-10-CM | POA: Diagnosis not present

## 2022-12-15 DIAGNOSIS — Z23 Encounter for immunization: Secondary | ICD-10-CM

## 2022-12-15 LAB — BASIC METABOLIC PANEL
BUN: 16 mg/dL (ref 6–23)
CO2: 30 mEq/L (ref 19–32)
Calcium: 9 mg/dL (ref 8.4–10.5)
Chloride: 102 mEq/L (ref 96–112)
Creatinine, Ser: 0.98 mg/dL (ref 0.40–1.50)
GFR: 78.98 mL/min (ref >60.00)
Glucose, Bld: 92 mg/dL (ref 70–99)
Potassium: 4.5 mEq/L (ref 3.5–5.1)
Sodium: 138 mEq/L (ref 135–145)

## 2022-12-15 LAB — CBC WITH DIFFERENTIAL/PLATELET
Basophils Absolute: 0.1 K/uL (ref 0.0–0.1)
Basophils Relative: 0.7 % (ref 0.0–3.0)
Eosinophils Absolute: 0.1 K/uL (ref 0.0–0.7)
Eosinophils Relative: 0.9 % (ref 0.0–5.0)
HCT: 46.8 % (ref 39.0–52.0)
Hemoglobin: 15.9 g/dL (ref 13.0–17.0)
Lymphocytes Relative: 28.8 % (ref 12.0–46.0)
Lymphs Abs: 2.6 K/uL (ref 0.7–4.0)
MCHC: 34 g/dL (ref 30.0–36.0)
MCV: 93.3 fl (ref 78.0–100.0)
Monocytes Absolute: 0.6 K/uL (ref 0.1–1.0)
Monocytes Relative: 6.5 % (ref 3.0–12.0)
Neutro Abs: 5.7 K/uL (ref 1.4–7.7)
Neutrophils Relative %: 63.1 % (ref 43.0–77.0)
Platelets: 325 K/uL (ref 150.0–400.0)
RBC: 5.01 Mil/uL (ref 4.22–5.81)
RDW: 13.2 % (ref 11.5–15.5)
WBC: 9.1 K/uL (ref 4.0–10.5)

## 2022-12-15 LAB — URINALYSIS, ROUTINE W REFLEX MICROSCOPIC
Bilirubin Urine: NEGATIVE
Hgb urine dipstick: NEGATIVE
Ketones, ur: NEGATIVE
Leukocytes,Ua: NEGATIVE
Nitrite: NEGATIVE
RBC / HPF: NONE SEEN (ref 0–?)
Specific Gravity, Urine: 1.02 (ref 1.000–1.030)
Total Protein, Urine: NEGATIVE
Urine Glucose: NEGATIVE
Urobilinogen, UA: 0.2 (ref 0.0–1.0)
pH: 6 (ref 5.0–8.0)

## 2022-12-15 LAB — HEPATIC FUNCTION PANEL
ALT: 17 U/L (ref 0–53)
AST: 14 U/L (ref 0–37)
Albumin: 4.1 g/dL (ref 3.5–5.2)
Alkaline Phosphatase: 76 U/L (ref 39–117)
Bilirubin, Direct: 0.1 mg/dL (ref 0.0–0.3)
Total Bilirubin: 0.3 mg/dL (ref 0.2–1.2)
Total Protein: 6.7 g/dL (ref 6.0–8.3)

## 2022-12-15 LAB — LIPID PANEL
Cholesterol: 155 mg/dL (ref 0–200)
HDL: 40.4 mg/dL (ref >39.00)
NonHDL: 114.22
Total CHOL/HDL Ratio: 4
Triglycerides: 267 mg/dL — ABNORMAL HIGH (ref 0.0–149.0)
VLDL: 53.4 mg/dL — ABNORMAL HIGH (ref 0.0–40.0)

## 2022-12-15 LAB — PSA: PSA: 0.49 ng/mL (ref 0.10–4.00)

## 2022-12-15 LAB — MICROALBUMIN / CREATININE URINE RATIO
Creatinine,U: 107.8 mg/dL
Microalb Creat Ratio: 0.6 mg/g (ref 0.0–30.0)
Microalb, Ur: <0.7 mg/dL (ref 0.0–1.9)

## 2022-12-15 LAB — TSH: TSH: 2.41 u[IU]/mL (ref 0.35–5.50)

## 2022-12-15 LAB — LDL CHOLESTEROL, DIRECT: Direct LDL: 81 mg/dL

## 2022-12-15 LAB — HEMOGLOBIN A1C: Hgb A1c MFr Bld: 6.8 % — ABNORMAL HIGH (ref 4.6–6.5)

## 2022-12-15 MED ORDER — SIMVASTATIN 40 MG PO TABS
40.0000 mg | ORAL_TABLET | Freq: Every day | ORAL | 1 refills | Status: DC
Start: 2022-12-15 — End: 2023-08-03

## 2022-12-15 NOTE — Progress Notes (Signed)
Subjective:  Patient ID: Jeremy Shannon, male    DOB: 17-Feb-1956  Age: 67 y.o. MRN: [id number]  CC: Annual Exam, Diabetes, Hypertension, and Hyperlipidemia   HPI Jeremy Shannon presents for a CPX and f/up ---   He complains of weight gain but tells me his blood sugar is well-controlled.  He has chronic, unchanged shortness of breath but denies cough, hemoptysis, chest pain, diaphoresis, or syncope.   Outpatient Medications Prior to Visit  Medication Sig Dispense Refill   clotrimazole-betamethasone (LOTRISONE) cream Apply 1 Application topically 2 (two) times daily. 30 g 0   doxepin (SINEQUAN) 150 MG capsule Take 1 capsule (150 mg total) by mouth at bedtime. 30 capsule 3   FLUoxetine (PROZAC) 20 MG capsule Take 20 mg by mouth daily.     lamoTRIgine (LAMICTAL) 25 MG tablet Take 50 mg by mouth daily.     OLANZapine (ZYPREXA) 10 MG tablet Take 10 mg by mouth at bedtime. At bedtime     OLANZapine (ZYPREXA) 5 MG tablet Take 5 mg by mouth in the morning and at bedtime. Once in the morning and once at noon     SUMAtriptan (IMITREX) 100 MG tablet Take 1 tablet (100 mg total) by mouth once for 1 dose. May repeat in 2 hours if headache persists or recurs.  Do not exceed 200 mg in 24 hours. 10 tablet 1   COVID-19 mRNA vaccine 2023-2024 (COMIRNATY) syringe Inject into the muscle. 0.3 mL 0   FLUoxetine (PROZAC) 20 MG tablet Take 20 mg by mouth daily. Morning     simvastatin (ZOCOR) 40 MG tablet Take 1 tablet (40 mg total) by mouth at bedtime. 90 tablet 3   traMADol (ULTRAM) 50 MG tablet Take 1 tablet (50 mg total) by mouth every 6 (six) hours as needed. 30 tablet 0   No facility-administered medications prior to visit.    ROS Review of Systems  Constitutional:  Positive for unexpected weight change (wt gain). Negative for diaphoresis and fatigue.  HENT: Negative.    Respiratory:  Positive for shortness of breath. Negative for cough, chest tightness and wheezing.   Cardiovascular:   Negative for chest pain, palpitations and leg swelling.  Gastrointestinal: Negative.  Negative for abdominal pain, constipation, diarrhea, nausea and vomiting.  Endocrine: Negative for polydipsia, polyphagia and polyuria.  Genitourinary: Negative.  Negative for difficulty urinating.  Musculoskeletal: Negative.   Skin: Negative.   Neurological:  Negative for dizziness, weakness, light-headedness and headaches.  Hematological:  Negative for adenopathy. Does not bruise/bleed easily.  Psychiatric/Behavioral: Negative.      Objective:  BP 124/86 (BP Location: Left Arm, Patient Position: Sitting, Cuff Size: Large)   Pulse 85   Temp 98 F (36.7 C) (Oral)   Resp 16   Ht 5\' 8"  (1.727 m)   Wt 225 lb (102.1 kg)   SpO2 91%   BMI 34.21 kg/m   BP Readings from Last 3 Encounters:  12/15/22 124/86  06/21/22 118/68  06/14/22 126/72    Wt Readings from Last 3 Encounters:  12/15/22 225 lb (102.1 kg)  06/21/22 221 lb 4 oz (100.4 kg)  06/14/22 222 lb (100.7 kg)    Physical Exam Vitals reviewed. Exam conducted with a chaperone present (his wife).  Constitutional:      General: He is not in acute distress.    Appearance: He is ill-appearing. He is not toxic-appearing or diaphoretic.  HENT:     Nose: Nose normal.  Eyes:  General: No scleral icterus.    Conjunctiva/sclera: Conjunctivae normal.  Cardiovascular:     Rate and Rhythm: Normal rate and regular rhythm.     Heart sounds: No murmur heard.    No friction rub. No gallop.     Comments: EKG- NSR, 67 bpm NS T wave changes No LVH or Q waves Pulmonary:     Effort: Pulmonary effort is normal.     Breath sounds: No stridor. Examination of the right-lower field reveals rales. Rales present. No decreased breath sounds, wheezing or rhonchi.  Abdominal:     General: Abdomen is flat.     Palpations: There is no mass.     Tenderness: There is no abdominal tenderness. There is no guarding or rebound.     Hernia: No hernia is present.  There is no hernia in the left inguinal area or right inguinal area.  Genitourinary:    Penis: Normal and circumcised.      Testes: Normal.     Epididymis:     Right: Normal.     Left: Normal.     Prostate: Normal. Not enlarged, not tender and no nodules present.     Rectum: Normal. Guaiac result negative. No mass, tenderness, anal fissure, external hemorrhoid or internal hemorrhoid. Normal anal tone.  Musculoskeletal:     Cervical back: Neck supple.     Right lower leg: No edema.     Left lower leg: No edema.  Lymphadenopathy:     Cervical: No cervical adenopathy.     Lower Body: No right inguinal adenopathy. No left inguinal adenopathy.  Skin:    General: Skin is warm and dry.  Neurological:     General: No focal deficit present.     Mental Status: He is alert. Mental status is at baseline.     Lab Results  Component Value Date   WBC 9.1 12/15/2022   HGB 15.9 12/15/2022   HCT 46.8 12/15/2022   PLT 325.0 12/15/2022   GLUCOSE 92 12/15/2022   CHOL 155 12/15/2022   TRIG 267.0 (H) 12/15/2022   HDL 40.40 12/15/2022   LDLDIRECT 81.0 12/15/2022   ALT 17 12/15/2022   AST 14 12/15/2022   NA 138 12/15/2022   K 4.5 12/15/2022   CL 102 12/15/2022   CREATININE 0.98 12/15/2022   BUN 16 12/15/2022   CO2 30 12/15/2022   TSH 2.41 12/15/2022   PSA 0.49 12/15/2022   HGBA1C 6.8 (H) 12/15/2022   MICROALBUR <0.7 12/15/2022    CT CHEST LUNG CA SCREEN LOW DOSE W/O CM  Result Date: 11/13/2022 CLINICAL DATA:  Lung cancer screening. Current smoker with 32 pack-year history. EXAM: CT CHEST WITHOUT CONTRAST LOW-DOSE FOR LUNG CANCER SCREENING TECHNIQUE: Multidetector CT imaging of the chest was performed following the standard protocol without IV contrast. RADIATION DOSE REDUCTION: This exam was performed according to the departmental dose-optimization program which includes automated exposure control, adjustment of the mA and/or kV according to patient size and/or use of iterative  reconstruction technique. COMPARISON:  10/05/2021 FINDINGS: Cardiovascular: Normal heart size. Mild aortic atherosclerosis and coronary artery calcifications. No pericardial effusion. Mediastinum/Nodes: No enlarged mediastinal, hilar, or axillary lymph nodes. Thyroid gland, trachea, and esophagus demonstrate no significant findings. Lungs/Pleura: Centrilobular emphysema. No pleural fluid or airspace disease. Scarring noted within the right lower lobe. No suspicious pulmonary nodule or mass. Upper Abdomen: No acute abnormality.  Cholecystectomy. Musculoskeletal: No chest wall mass or suspicious bone lesions identified. IMPRESSION: 1. Lung-RADS 1, negative. Continue annual screening with low-dose chest  CT without contrast in 12 months. 2. Coronary artery calcifications. 3. Aortic Atherosclerosis (ICD10-I70.0) and Emphysema (ICD10-J43.9). Electronically Signed   By: Signa Kellaylor  Stroud M.D.   On: 11/13/2022 07:37   Assessment & Plan:   Primary hypertension- His blood pressure is adequately well-controlled. -     CBC with Differential/Platelet; Future -     TSH; Future -     Urinalysis, Routine w reflex microscopic; Future -     Basic metabolic panel; Future -     EKG 12-Lead  Hyperlipidemia with target LDL less than 100 - LDL goal achieved. Doing well on the statin  -     Lipid panel; Future -     Hepatic function panel; Future -     TSH; Future -     Simvastatin; Take 1 tablet (40 mg total) by mouth at bedtime.  Dispense: 90 tablet; Refill: 1  Pure hyperglyceridemia -     Lipid panel; Future -     Hepatic function panel; Future  Type II diabetes mellitus with complication- His blood sugar is adequately well-controlled. -     Hemoglobin A1c; Future -     Microalbumin / creatinine urine ratio; Future -     Basic metabolic panel; Future -     HM Diabetes Foot Exam  Prostate cancer screening -     PSA; Future  Encounter for general adult medical examination with abnormal findings- Exam completed,  labs reviewed, vaccines reviewed and updated, cancer screenings are up-to-date, patient education was given.  Hyperkeratosis of forefoot- I encouraged better foot hygiene.  Panlobular emphysema- He is trying to quit smoking.  Need for prophylactic vaccination with combined diphtheria-tetanus-pertussis (DTP) vaccine -     Boostrix; Inject 0.5 mLs into the muscle once for 1 dose.  Dispense: 0.5 mL; Refill: 0  Need for varicella vaccine -     Shingrix; Inject 0.5 mLs into the muscle once for 1 dose.  Dispense: 0.5 mL; Refill: 1  Atherosclerosis of aorta- Risk factor modifications addressed.  Other orders -     LDL cholesterol, direct     Follow-up: Return in about 6 months (around 06/16/2023).  Sanda Lingerhomas Joneisha Miles, MD

## 2022-12-15 NOTE — Patient Instructions (Signed)
Health Maintenance, Male Adopting a healthy lifestyle and getting preventive care are important in promoting health and wellness. Ask your health care provider about: The right schedule for you to have regular tests and exams. Things you can do on your own to prevent diseases and keep yourself healthy. What should I know about diet, weight, and exercise? Eat a healthy diet  Eat a diet that includes plenty of vegetables, fruits, low-fat dairy products, and lean protein. Do not eat a lot of foods that are high in solid fats, added sugars, or sodium. Maintain a healthy weight Body mass index (BMI) is a measurement that can be used to identify possible weight problems. It estimates body fat based on height and weight. Your health care provider can help determine your BMI and help you achieve or maintain a healthy weight. Get regular exercise Get regular exercise. This is one of the most important things you can do for your health. Most adults should: Exercise for at least 150 minutes each week. The exercise should increase your heart rate and make you sweat (moderate-intensity exercise). Do strengthening exercises at least twice a week. This is in addition to the moderate-intensity exercise. Spend less time sitting. Even light physical activity can be beneficial. Watch cholesterol and blood lipids Have your blood tested for lipids and cholesterol at 69 years of age, then have this test every 5 years. You may need to have your cholesterol levels checked more often if: Your lipid or cholesterol levels are high. You are older than 69 years of age. You are at high risk for heart disease. What should I know about cancer screening? Many types of cancers can be detected early and may often be prevented. Depending on your health history and family history, you may need to have cancer screening at various ages. This may include screening for: Colorectal cancer. Prostate cancer. Skin cancer. Lung  cancer. What should I know about heart disease, diabetes, and high blood pressure? Blood pressure and heart disease High blood pressure causes heart disease and increases the risk of stroke. This is more likely to develop in people who have high blood pressure readings or are overweight. Talk with your health care provider about your target blood pressure readings. Have your blood pressure checked: Every 3-5 years if you are 18-39 years of age. Every year if you are 40 years old or older. If you are between the ages of 65 and 75 and are a current or former smoker, ask your health care provider if you should have a one-time screening for abdominal aortic aneurysm (AAA). Diabetes Have regular diabetes screenings. This checks your fasting blood sugar level. Have the screening done: Once every three years after age 45 if you are at a normal weight and have a low risk for diabetes. More often and at a younger age if you are overweight or have a high risk for diabetes. What should I know about preventing infection? Hepatitis B If you have a higher risk for hepatitis B, you should be screened for this virus. Talk with your health care provider to find out if you are at risk for hepatitis B infection. Hepatitis C Blood testing is recommended for: Everyone born from 1945 through 1965. Anyone with known risk factors for hepatitis C. Sexually transmitted infections (STIs) You should be screened each year for STIs, including gonorrhea and chlamydia, if: You are sexually active and are younger than 69 years of age. You are older than 69 years of age and your   health care provider tells you that you are at risk for this type of infection. Your sexual activity has changed since you were last screened, and you are at increased risk for chlamydia or gonorrhea. Ask your health care provider if you are at risk. Ask your health care provider about whether you are at high risk for HIV. Your health care provider  may recommend a prescription medicine to help prevent HIV infection. If you choose to take medicine to prevent HIV, you should first get tested for HIV. You should then be tested every 3 months for as long as you are taking the medicine. Follow these instructions at home: Alcohol use Do not drink alcohol if your health care provider tells you not to drink. If you drink alcohol: Limit how much you have to 0-2 drinks a day. Know how much alcohol is in your drink. In the [place] one drink equals one 12 oz bottle of beer (355 mL), one 5 oz glass of wine (148 mL), or one 1 oz glass of hard liquor (44 mL). Lifestyle Do not use any products that contain nicotine or tobacco. These products include cigarettes, chewing tobacco, and vaping devices, such as e-cigarettes. If you need help quitting, ask your health care provider. Do not use street drugs. Do not share needles. Ask your health care provider for help if you need support or information about quitting drugs. General instructions Schedule regular health, dental, and eye exams. Stay current with your vaccines. Tell your health care provider if: You often feel depressed. You have ever been abused or do not feel safe at home. Summary Adopting a healthy lifestyle and getting preventive care are important in promoting health and wellness. Follow your health care provider's instructions about healthy diet, exercising, and getting tested or screened for diseases. Follow your health care provider's instructions on monitoring your cholesterol and blood pressure. This information is not intended to replace advice given to you by your health care provider. Make sure you discuss any questions you have with your health care provider. Document Revised: 01/18/2021 Document Reviewed: 01/18/2021 Elsevier Patient Education  2023 [employer]  

## 2022-12-16 DIAGNOSIS — Z23 Encounter for immunization: Secondary | ICD-10-CM | POA: Insufficient documentation

## 2022-12-16 DIAGNOSIS — I700 Atherosclerosis of aorta: Secondary | ICD-10-CM | POA: Insufficient documentation

## 2022-12-16 MED ORDER — SHINGRIX 50 MCG/0.5ML IM SUSR
0.5000 mL | Freq: Once | INTRAMUSCULAR | 1 refills | Status: AC
Start: 2022-12-16 — End: 2022-12-16

## 2022-12-16 MED ORDER — BOOSTRIX 5-2.5-18.5 LF-MCG/0.5 IM SUSP
0.5000 mL | Freq: Once | INTRAMUSCULAR | 0 refills | Status: AC
Start: 2022-12-16 — End: 2022-12-16

## 2022-12-26 DIAGNOSIS — G4733 Obstructive sleep apnea (adult) (pediatric): Secondary | ICD-10-CM | POA: Diagnosis not present

## 2023-03-22 DIAGNOSIS — G4733 Obstructive sleep apnea (adult) (pediatric): Secondary | ICD-10-CM | POA: Diagnosis not present

## 2023-03-22 DIAGNOSIS — R413 Other amnesia: Secondary | ICD-10-CM | POA: Diagnosis not present

## 2023-04-14 DIAGNOSIS — Z79899 Other long term (current) drug therapy: Secondary | ICD-10-CM | POA: Diagnosis not present

## 2023-04-25 ENCOUNTER — Other Ambulatory Visit: Payer: Self-pay | Admitting: Family Medicine

## 2023-04-25 ENCOUNTER — Ambulatory Visit (INDEPENDENT_AMBULATORY_CARE_PROVIDER_SITE_OTHER): Payer: Medicare Other | Admitting: Family Medicine

## 2023-04-25 ENCOUNTER — Encounter: Payer: Self-pay | Admitting: Family Medicine

## 2023-04-25 VITALS — BP 114/82 | HR 88 | Temp 98.0°F | Ht 68.0 in | Wt 230.0 lb

## 2023-04-25 DIAGNOSIS — R053 Chronic cough: Secondary | ICD-10-CM

## 2023-04-25 DIAGNOSIS — R140 Abdominal distension (gaseous): Secondary | ICD-10-CM | POA: Diagnosis not present

## 2023-04-25 DIAGNOSIS — R142 Eructation: Secondary | ICD-10-CM

## 2023-04-25 DIAGNOSIS — F17200 Nicotine dependence, unspecified, uncomplicated: Secondary | ICD-10-CM

## 2023-04-25 MED ORDER — PANTOPRAZOLE SODIUM 40 MG PO TBEC
40.0000 mg | DELAYED_RELEASE_TABLET | Freq: Every day | ORAL | 1 refills | Status: DC
Start: 2023-04-25 — End: 2023-04-25

## 2023-04-25 NOTE — Progress Notes (Unsigned)
Subjective:     Patient ID: Jeremy Shannon, male    DOB: [birth year], 67 y.o.   MRN: [id number]  Chief Complaint  Patient presents with   Gastroesophageal Reflux    Every time he eats something he gets bloated feeling and has to try and burp to get rid of it    HPI  Discussed the use of AI scribe software for clinical note transcription with the patient, who gave verbal consent to proceed.  History of Present Illness         C/o abdominal bloating and belching x several weeks. His wife is with him. Reports patient is eating and laying down. He often gets up at night to eat and lays back down. States he overeats and eats quickly.  Denies abdominal pain, diarrhea or constipation.   No fever, chills, chest pain, palpitations, shortness of breath.   Smoking 1 1/2 ppd  Hx of cholecystectomy  Health Maintenance Due  Topic Date Due   DTaP/Tdap/Td (1 - Tdap) Never done   Zoster Vaccines- Shingrix (1 of 2) Never done   COVID-19 Vaccine (6 - 2023-24 season) 08/23/2022   INFLUENZA VACCINE  04/13/2023   Medicare Annual Wellness (AWV)  06/14/2023    Past Medical History:  Diagnosis Date   Anemia    pt denies   Anxiety    Cataract    Depression    Diabetes mellitus without complication (HCC)    type 2   Dyspnea    Hyperlipidemia    PONV (postoperative nausea and vomiting)     Past Surgical History:  Procedure Laterality Date   BRONCHIAL BIOPSY  02/16/2021   Procedure: BRONCHIAL BIOPSIES;  Surgeon: Josephine Igo, DO;  Location: [facility];  Service: Pulmonary;;   BRONCHIAL BRUSHINGS  02/16/2021   Procedure: BRONCHIAL BRUSHINGS;  Surgeon: Josephine Igo, DO;  Location: [facility];  Service: Pulmonary;;   BRONCHIAL NEEDLE ASPIRATION BIOPSY  02/16/2021   Procedure: BRONCHIAL NEEDLE ASPIRATION BIOPSIES;  Surgeon: Josephine Igo, DO;  Location: [facility];  Service: Pulmonary;;   BRONCHIAL WASHINGS  02/16/2021   Procedure: BRONCHIAL WASHINGS;  Surgeon: Josephine Igo, DO;  Location: [facility];  Service: Pulmonary;;   CATARACT EXTRACTION     bilateral   CHOLECYSTECTOMY N/A 02/10/2017   Procedure: LAPAROSCOPIC CHOLECYSTECTOMY WITH INTRAOPERATIVE CHOLANGIOGRAM;  Surgeon: Ovidio Kin, MD;  Location: [facility];  Service: General;  Laterality: N/A;   COLONOSCOPY     FIDUCIAL MARKER PLACEMENT  02/16/2021   Procedure: FIDUCIAL MARKER PLACEMENT;  Surgeon: Josephine Igo, DO;  Location: [facility];  Service: Pulmonary;;   VIDEO BRONCHOSCOPY WITH ENDOBRONCHIAL NAVIGATION Right 02/16/2021   Procedure: VIDEO BRONCHOSCOPY WITH ENDOBRONCHIAL NAVIGATION;  Surgeon: Josephine Igo, DO;  Location: [facility];  Service: Pulmonary;  Laterality: Right;    Family History  Problem Relation Age of Onset   Liver cancer Mother    Depression Father    Drug abuse Father     Social History   Socioeconomic History   Marital status: Married    Spouse name: Not on file   Number of children: Not on file   Years of education: Not on file   Highest education level: Not on file  Occupational History   Not on file  Tobacco Use   Smoking status: Every Day    Current packs/day: 1.00    Average packs/day: 1 pack/day for 43.0 years (43.0 ttl pk-yrs)    Types: Cigarettes    Passive exposure: Current  Smokeless tobacco: Never   Tobacco comments:    10-20 cigarettes smoked daily 02/17/2021  Vaping Use   Vaping status: Never Used  Substance and Sexual Activity   Alcohol use: No   Drug use: No   Sexual activity: Yes    Partners: Female    Birth control/protection: None  Other Topics Concern   Not on file  Social History Narrative   Patient is currently a smoker and states he is not ready to quit   Social Determinants of Health   Financial Resource Strain: Low Risk  (04/25/2022)   Overall Financial Resource Strain (CARDIA)    Difficulty of Paying Living Expenses: Not hard at all  Food Insecurity: No Food Insecurity (04/25/2022)   Hunger Vital Sign    Worried About  Running Out of Food in the Last Year: Never true    Ran Out of Food in the Last Year: Never true  Transportation Needs: No Transportation Needs (04/25/2022)   PRAPARE - Administrator, Civil Service (Medical): No    Lack of Transportation (Non-Medical): No  Physical Activity: Insufficiently Active (04/25/2022)   Exercise Vital Sign    Days of Exercise per Week: 2 days    Minutes of Exercise per Session: 20 min  Stress: No Stress Concern Present (04/25/2022)   [employer] of Occupational Health - Occupational Stress Questionnaire    Feeling of Stress : Not at all  Social Connections: Moderately Integrated (04/25/2022)   Social Connection and Isolation Panel [NHANES]    Frequency of Communication with Friends and Family: Three times a week    Frequency of Social Gatherings with Friends and Family: Three times a week    Attends Religious Services: More than 4 times per year    Active Member of Clubs or Organizations: No    Attends Banker Meetings: Never    Marital Status: Married  Catering manager Violence: Not At Risk (04/25/2022)   Humiliation, Afraid, Rape, and Kick questionnaire    Fear of Current or Ex-Partner: No    Emotionally Abused: No    Physically Abused: No    Sexually Abused: No    Outpatient Medications Prior to Visit  Medication Sig Dispense Refill   clotrimazole-betamethasone (LOTRISONE) cream Apply 1 Application topically 2 (two) times daily. 30 g 0   doxepin (SINEQUAN) 150 MG capsule Take 1 capsule (150 mg total) by mouth at bedtime. 30 capsule 3   FLUoxetine (PROZAC) 20 MG capsule Take 20 mg by mouth daily.     lamoTRIgine (LAMICTAL) 25 MG tablet Take 50 mg by mouth daily.     OLANZapine (ZYPREXA) 10 MG tablet Take 10 mg by mouth at bedtime. At bedtime     OLANZapine (ZYPREXA) 5 MG tablet Take 5 mg by mouth in the morning and at bedtime. Once in the morning and once at noon     simvastatin (ZOCOR) 40 MG tablet Take 1 tablet (40 mg total)  by mouth at bedtime. 90 tablet 1   SUMAtriptan (IMITREX) 100 MG tablet Take 1 tablet (100 mg total) by mouth once for 1 dose. May repeat in 2 hours if headache persists or recurs.  Do not exceed 200 mg in 24 hours. 10 tablet 1   No facility-administered medications prior to visit.    No Known Allergies  ROS     Objective:    Physical Exam Constitutional:      General: He is not in acute distress.    Appearance: He is  not ill-appearing.  HENT:     Mouth/Throat:     Mouth: Mucous membranes are moist.     Pharynx: Oropharynx is clear.  Eyes:     Extraocular Movements: Extraocular movements intact.     Conjunctiva/sclera: Conjunctivae normal.  Cardiovascular:     Rate and Rhythm: Normal rate and regular rhythm.  Pulmonary:     Effort: Pulmonary effort is normal.     Breath sounds: Normal breath sounds.  Abdominal:     General: Bowel sounds are normal. There is no distension.     Palpations: Abdomen is soft.     Tenderness: There is no abdominal tenderness. There is no guarding or rebound.  Musculoskeletal:     Cervical back: Normal range of motion and neck supple.     Right lower leg: No edema.     Left lower leg: No edema.  Skin:    General: Skin is warm and dry.  Neurological:     General: No focal deficit present.     Mental Status: He is alert and oriented to person, place, and time.     Cranial Nerves: No cranial nerve deficit.     Motor: No weakness.     Coordination: Coordination normal.  Psychiatric:        Mood and Affect: Mood normal.        Behavior: Behavior normal.        Thought Content: Thought content normal.      BP 114/82 (BP Location: Left Arm, Patient Position: Sitting, Cuff Size: Large)   Pulse 88   Temp 98 F (36.7 C) (Temporal)   Ht 5\' 8"  (1.727 m)   Wt 230 lb (104.3 kg)   SpO2 95%   BMI 34.97 kg/m  Wt Readings from Last 3 Encounters:  04/25/23 230 lb (104.3 kg)  12/15/22 225 lb (102.1 kg)  06/21/22 221 lb 4 oz (100.4 kg)        Assessment & Plan:   Problem List Items Addressed This Visit       Other   Smoker   Other Visit Diagnoses     Abdominal bloating    -  Primary   Chronic cough       Belching          No red flag symptoms.  Abdominal exam benign.  Prescribed pantoprazole.  Counseling on lifestyle modifications to decrease bloating.  Encouraged him to cut back on smoking.  Wife has more questions regarding chronic health conditions.  Encouraged them to follow up with PCP and neurologist.    I am having Jeremy Spearman. Shannon maintain his doxepin, OLANZapine, OLANZapine, clotrimazole-betamethasone, FLUoxetine, lamoTRIgine, SUMAtriptan, and simvastatin.  Meds ordered this encounter  Medications   DISCONTD: pantoprazole (PROTONIX) 40 MG tablet    Sig: Take 1 tablet (40 mg total) by mouth daily.    Dispense:  30 tablet    Refill:  1    Order Specific Question:   Supervising Provider    Answer:   Hillard Danker A [[id number]]

## 2023-04-25 NOTE — Patient Instructions (Signed)
Start acid medication.   Avoid eating fast and overeating.   Avoid eating and laying down for at least 2-3 hours.   Limit carbonated beverages.   Follow up with Dr. Yetta Barre if no improvement in the next 4-6 weeks.    Gastroesophageal Reflux Disease, Adult Gastroesophageal reflux (GER) happens when acid from the stomach flows up into the tube that connects the mouth and the stomach (esophagus). Normally, food travels down the esophagus and stays in the stomach to be digested. However, when a person has GER, food and stomach acid sometimes move back up into the esophagus. If this becomes a more serious problem, the person may be diagnosed with a disease called gastroesophageal reflux disease (GERD). GERD occurs when the reflux: Happens often. Causes frequent or severe symptoms. Causes problems such as damage to the esophagus. When stomach acid comes in contact with the esophagus, the acid may cause inflammation in the esophagus. Over time, GERD may create small holes (ulcers) in the lining of the esophagus. What are the causes? This condition is caused by a problem with the muscle between the esophagus and the stomach (lower esophageal sphincter, or LES). Normally, the LES muscle closes after food passes through the esophagus to the stomach. When the LES is weakened or abnormal, it does not close properly, and that allows food and stomach acid to go back up into the esophagus. The LES can be weakened by certain dietary substances, medicines, and medical conditions, including: Tobacco use. Pregnancy. Having a hiatal hernia. Alcohol use. Certain foods and beverages, such as coffee, chocolate, onions, and peppermint. What increases the risk? You are more likely to develop this condition if you: Have an increased body weight. Have a connective tissue disorder. Take NSAIDs, such as ibuprofen. What are the signs or symptoms? Symptoms of this condition include: Heartburn. Difficult or painful  swallowing and the feeling of having a lump in the throat. A bitter taste in the mouth. Bad breath and having a large amount of saliva. Having an upset or bloated stomach and belching. Chest pain. Different conditions can cause chest pain. Make sure you see your health care provider if you experience chest pain. Shortness of breath or wheezing. Ongoing (chronic) cough or a nighttime cough. Wearing away of tooth enamel. Weight loss. How is this diagnosed? This condition may be diagnosed based on a medical history and a physical exam. To determine if you have mild or severe GERD, your health care provider may also monitor how you respond to treatment. You may also have tests, including: A test to examine your stomach and esophagus with a small camera (endoscopy). A test that measures the acidity level in your esophagus. A test that measures how much pressure is on your esophagus. A barium swallow or modified barium swallow test to show the shape, size, and functioning of your esophagus. How is this treated? Treatment for this condition may vary depending on how severe your symptoms are. Your health care provider may recommend: Changes to your diet. Medicine. Surgery. The goal of treatment is to help relieve your symptoms and to prevent complications. Follow these instructions at home: Eating and drinking  Follow a diet as recommended by your health care provider. This may involve avoiding foods and drinks such as: Coffee and tea, with or without caffeine. Drinks that contain alcohol. Energy drinks and sports drinks. Carbonated drinks or sodas. Chocolate and cocoa. Peppermint and mint flavorings. Garlic and onions. Horseradish. Spicy and acidic foods, including peppers, chili powder, curry powder,  vinegar, hot sauces, and barbecue sauce. Citrus fruit juices and citrus fruits, such as oranges, lemons, and limes. Tomato-based foods, such as red sauce, chili, salsa, and pizza with red  sauce. Fried and fatty foods, such as donuts, french fries, potato chips, and high-fat dressings. High-fat meats, such as hot dogs and fatty cuts of red and white meats, such as rib eye steak, sausage, ham, and bacon. High-fat dairy items, such as whole milk, butter, and cream cheese. Eat small, frequent meals instead of large meals. Avoid drinking large amounts of liquid with your meals. Avoid eating meals during the 2-3 hours before bedtime. Avoid lying down right after you eat. Do not exercise right after you eat. Lifestyle  Do not use any products that contain nicotine or tobacco. These products include cigarettes, chewing tobacco, and vaping devices, such as e-cigarettes. If you need help quitting, ask your health care provider. Try to reduce your stress by using methods such as yoga or meditation. If you need help reducing stress, ask your health care provider. If you are overweight, reduce your weight to an amount that is healthy for you. Ask your health care provider for guidance about a safe weight loss goal. General instructions Pay attention to any changes in your symptoms. Take over-the-counter and prescription medicines only as told by your health care provider. Do not take aspirin, ibuprofen, or other NSAIDs unless your health care provider told you to take these medicines. Wear loose-fitting clothing. Do not wear anything tight around your waist that causes pressure on your abdomen. Raise (elevate) the head of your bed about 6 inches (15 cm). You can use a wedge to do this. Avoid bending over if this makes your symptoms worse. Keep all follow-up visits. This is important. Contact a health care provider if: You have: New symptoms. Unexplained weight loss. Difficulty swallowing or it hurts to swallow. Wheezing or a persistent cough. A hoarse voice. Your symptoms do not improve with treatment. Get help right away if: You have sudden pain in your arms, neck, jaw, teeth, or  back. You suddenly feel sweaty, dizzy, or light-headed. You have chest pain or shortness of breath. You vomit and the vomit is green, yellow, or black, or it looks like blood or coffee grounds. You faint. You have stool that is red, bloody, or black. You cannot swallow, drink, or eat. These symptoms may represent a serious problem that is an emergency. Do not wait to see if the symptoms will go away. Get medical help right away. Call your local emergency services (911 in the [place] Do not drive yourself to the hospital. Summary Gastroesophageal reflux happens when acid from the stomach flows up into the esophagus. GERD is a disease in which the reflux happens often, causes frequent or severe symptoms, or causes problems such as damage to the esophagus. Treatment for this condition may vary depending on how severe your symptoms are. Your health care provider may recommend diet and lifestyle changes, medicine, or surgery. Contact a health care provider if you have new or worsening symptoms. Take over-the-counter and prescription medicines only as told by your health care provider. Do not take aspirin, ibuprofen, or other NSAIDs unless your health care provider told you to do so. Keep all follow-up visits as told by your health care provider. This is important. This information is not intended to replace advice given to you by your health care provider. Make sure you discuss any questions you have with your health care provider. Document Revised: 03/07/2020  Document Reviewed: 03/09/2020 Elsevier Patient Education  2024 [employer].

## 2023-04-27 ENCOUNTER — Telehealth: Payer: Self-pay | Admitting: Internal Medicine

## 2023-04-27 NOTE — Telephone Encounter (Signed)
LM on VM per DPR with Vickie's recommendations. Advised to call the office for any questions

## 2023-04-27 NOTE — Telephone Encounter (Signed)
Patient's wife called and said the patient was seen 04/25/23 by Hetty Blend. Patient was prescribed pantoprazole and he took it for two days. He stopped taking it because it was making him dizzy. They would like to know what they should do since he can't take that medication. Best callback is [phone].

## 2023-04-27 NOTE — Telephone Encounter (Signed)
Please advise, pt took Protonix but made him dizzy. He stopped taking it and is wondering if there is an alternative?

## 2023-04-29 DIAGNOSIS — K219 Gastro-esophageal reflux disease without esophagitis: Secondary | ICD-10-CM | POA: Diagnosis not present

## 2023-05-03 DIAGNOSIS — R140 Abdominal distension (gaseous): Secondary | ICD-10-CM | POA: Diagnosis not present

## 2023-05-03 DIAGNOSIS — R1013 Epigastric pain: Secondary | ICD-10-CM | POA: Diagnosis not present

## 2023-05-05 DIAGNOSIS — R1013 Epigastric pain: Secondary | ICD-10-CM | POA: Diagnosis not present

## 2023-05-05 DIAGNOSIS — R140 Abdominal distension (gaseous): Secondary | ICD-10-CM | POA: Diagnosis not present

## 2023-05-12 DIAGNOSIS — B3781 Candidal esophagitis: Secondary | ICD-10-CM | POA: Diagnosis not present

## 2023-05-12 DIAGNOSIS — R140 Abdominal distension (gaseous): Secondary | ICD-10-CM | POA: Diagnosis not present

## 2023-05-12 DIAGNOSIS — R1013 Epigastric pain: Secondary | ICD-10-CM | POA: Diagnosis not present

## 2023-05-12 DIAGNOSIS — F17210 Nicotine dependence, cigarettes, uncomplicated: Secondary | ICD-10-CM | POA: Diagnosis not present

## 2023-05-12 DIAGNOSIS — Z9049 Acquired absence of other specified parts of digestive tract: Secondary | ICD-10-CM | POA: Diagnosis not present

## 2023-05-12 DIAGNOSIS — K2289 Other specified disease of esophagus: Secondary | ICD-10-CM | POA: Diagnosis not present

## 2023-05-12 DIAGNOSIS — Z79899 Other long term (current) drug therapy: Secondary | ICD-10-CM | POA: Diagnosis not present

## 2023-06-01 ENCOUNTER — Ambulatory Visit: Payer: Medicare Other | Admitting: Internal Medicine

## 2023-06-08 ENCOUNTER — Other Ambulatory Visit (HOSPITAL_BASED_OUTPATIENT_CLINIC_OR_DEPARTMENT_OTHER): Payer: Self-pay

## 2023-06-08 MED ORDER — COMIRNATY 30 MCG/0.3ML IM SUSY
0.3000 mL | PREFILLED_SYRINGE | INTRAMUSCULAR | 0 refills | Status: DC
  Filled 2023-06-08: qty 0.3, 1d supply, fill #0

## 2023-07-07 ENCOUNTER — Encounter: Payer: Self-pay | Admitting: Family Medicine

## 2023-07-07 ENCOUNTER — Ambulatory Visit (INDEPENDENT_AMBULATORY_CARE_PROVIDER_SITE_OTHER): Payer: Medicare Other | Admitting: Family Medicine

## 2023-07-07 ENCOUNTER — Ambulatory Visit (INDEPENDENT_AMBULATORY_CARE_PROVIDER_SITE_OTHER): Payer: Medicare Other

## 2023-07-07 VITALS — BP 132/84 | HR 94 | Temp 97.6°F | Ht 68.0 in | Wt 226.0 lb

## 2023-07-07 DIAGNOSIS — F17200 Nicotine dependence, unspecified, uncomplicated: Secondary | ICD-10-CM

## 2023-07-07 DIAGNOSIS — B3781 Candidal esophagitis: Secondary | ICD-10-CM | POA: Diagnosis not present

## 2023-07-07 DIAGNOSIS — R059 Cough, unspecified: Secondary | ICD-10-CM | POA: Diagnosis not present

## 2023-07-07 DIAGNOSIS — R918 Other nonspecific abnormal finding of lung field: Secondary | ICD-10-CM | POA: Diagnosis not present

## 2023-07-07 DIAGNOSIS — R051 Acute cough: Secondary | ICD-10-CM | POA: Diagnosis not present

## 2023-07-07 MED ORDER — FLUCONAZOLE 200 MG PO TABS
200.0000 mg | ORAL_TABLET | Freq: Every day | ORAL | 0 refills | Status: DC
Start: 2023-07-07 — End: 2023-08-03

## 2023-07-07 NOTE — Patient Instructions (Addendum)
Stop wearing the patch since you are still smoking.   Continue omeprazole as prescribed.   Prop yourself up when laying down.   Use your CPAP.   Cut back on caffeine since this can make acid reflux worse.   I will refill the antifungal to see if this helps and get a chest X ray to rule out anything else in the lungs.

## 2023-07-07 NOTE — Progress Notes (Unsigned)
Subjective:  Jeremy Shannon is a 67 y.o. male who presents for an 8 day hx of cough.   Wife reports patient is smoking, drinking a lot of coffee and not using CPAP.   He is also wearing a nicotine patch.   EGD recently done and showed candida esophagitis.   Completed course of fluconazole and was feeling back to baseline but after completing the course, the cough returned.   ROS as in subjective.   Objective: Vitals:   07/07/23 1337  BP: 132/84  Pulse: 94  Temp: 97.6 F (36.4 C)  SpO2: 97%    General appearance: Alert, WD/WN, no distress                             Skin: warm, no rash                           Head: no sinus tenderness                            Eyes: conjunctiva normal, corneas clear, PERRLA                            Ears: pearly TMs, external ear canals normal                          Nose: septum midline, no drainage              Mouth/throat: MMM, tongue normal, mild pharyngeal erythema                           Neck: supple, no adenopathy, no thyromegaly, nontender                          Heart: RRR                         Lungs: CTA bilaterally, no wheezes, rales, or rhonchi      Assessment: Acute cough - Plan: DG Chest 2 View, fluconazole (DIFLUCAN) 200 MG tablet  Smoker - Plan: DG Chest 2 View  Candida esophagitis (HCC) - Plan: fluconazole (DIFLUCAN) 200 MG tablet   Plan: Chest X ray ordered.  Reviewed notes and results from GI.  2 wk refill of fluconazole since symptoms seemed to resolve when on this medication.  Advised him to remove nicotine patch since he is smoking and not ready to stop.  Cut back on coffee, caffeine and avoid eating after laying down.  Continue omeprazole.  Follow up with PCP in 4 wks and GI if not improving.

## 2023-07-26 ENCOUNTER — Other Ambulatory Visit (HOSPITAL_BASED_OUTPATIENT_CLINIC_OR_DEPARTMENT_OTHER): Payer: Self-pay

## 2023-07-26 MED ORDER — FLUAD 0.5 ML IM SUSY
PREFILLED_SYRINGE | INTRAMUSCULAR | 0 refills | Status: DC
  Filled 2023-07-26: qty 0.5, 1d supply, fill #0

## 2023-08-03 ENCOUNTER — Ambulatory Visit (INDEPENDENT_AMBULATORY_CARE_PROVIDER_SITE_OTHER): Payer: Medicare Other | Admitting: Internal Medicine

## 2023-08-03 ENCOUNTER — Encounter: Payer: Self-pay | Admitting: Internal Medicine

## 2023-08-03 VITALS — BP 132/78 | HR 98 | Temp 97.9°F | Resp 16 | Ht 69.0 in | Wt 233.0 lb

## 2023-08-03 DIAGNOSIS — E785 Hyperlipidemia, unspecified: Secondary | ICD-10-CM

## 2023-08-03 DIAGNOSIS — E781 Pure hyperglyceridemia: Secondary | ICD-10-CM | POA: Diagnosis not present

## 2023-08-03 DIAGNOSIS — I10 Essential (primary) hypertension: Secondary | ICD-10-CM

## 2023-08-03 DIAGNOSIS — E118 Type 2 diabetes mellitus with unspecified complications: Secondary | ICD-10-CM | POA: Diagnosis not present

## 2023-08-03 LAB — CK: Total CK: 110 U/L (ref 7–232)

## 2023-08-03 LAB — TRIGLYCERIDES: Triglycerides: 394 mg/dL — ABNORMAL HIGH (ref 0.0–149.0)

## 2023-08-03 LAB — BASIC METABOLIC PANEL
BUN: 16 mg/dL (ref 6–23)
CO2: 26 meq/L (ref 19–32)
Calcium: 9 mg/dL (ref 8.4–10.5)
Chloride: 103 meq/L (ref 96–112)
Creatinine, Ser: 1.07 mg/dL (ref 0.40–1.50)
GFR: 70.76 mL/min (ref >60.00)
Glucose, Bld: 156 mg/dL — ABNORMAL HIGH (ref 70–99)
Potassium: 3.8 meq/L (ref 3.5–5.1)
Sodium: 136 meq/L (ref 135–145)

## 2023-08-03 LAB — HEPATIC FUNCTION PANEL
ALT: 19 U/L (ref 0–53)
AST: 14 U/L (ref 0–37)
Albumin: 4 g/dL (ref 3.5–5.2)
Alkaline Phosphatase: 72 U/L (ref 39–117)
Bilirubin, Direct: 0 mg/dL (ref 0.0–0.3)
Total Bilirubin: 0.4 mg/dL (ref 0.2–1.2)
Total Protein: 7 g/dL (ref 6.0–8.3)

## 2023-08-03 LAB — CBC WITH DIFFERENTIAL/PLATELET
Basophils Absolute: 0 10*3/uL (ref 0.0–0.1)
Basophils Relative: 0.5 % (ref 0.0–3.0)
Eosinophils Absolute: 0 10*3/uL (ref 0.0–0.7)
Eosinophils Relative: 0.4 % (ref 0.0–5.0)
HCT: 46.9 % (ref 39.0–52.0)
Hemoglobin: 15.7 g/dL (ref 13.0–17.0)
Lymphocytes Relative: 22.4 % (ref 12.0–46.0)
Lymphs Abs: 2 10*3/uL (ref 0.7–4.0)
MCHC: 33.4 g/dL (ref 30.0–36.0)
MCV: 94.5 fL (ref 78.0–100.0)
Monocytes Absolute: 0.4 10*3/uL (ref 0.1–1.0)
Monocytes Relative: 4.5 % (ref 3.0–12.0)
Neutro Abs: 6.5 10*3/uL (ref 1.4–7.7)
Neutrophils Relative %: 72.2 % (ref 43.0–77.0)
Platelets: 348 10*3/uL (ref 150.0–400.0)
RBC: 4.96 Mil/uL (ref 4.22–5.81)
RDW: 13.7 % (ref 11.5–15.5)
WBC: 9 10*3/uL (ref 4.0–10.5)

## 2023-08-03 LAB — HEMOGLOBIN A1C: Hgb A1c MFr Bld: 6.9 % — ABNORMAL HIGH (ref 4.6–6.5)

## 2023-08-03 MED ORDER — SIMVASTATIN 40 MG PO TABS: 40.0000 mg | ORAL_TABLET | Freq: Every day | ORAL | 1 refills | Status: DC

## 2023-08-03 MED ORDER — OMEGA-3-ACID ETHYL ESTERS 1 G PO CAPS: 2.0000 g | ORAL_CAPSULE | Freq: Two times a day (BID) | ORAL | 1 refills | Status: DC

## 2023-08-03 NOTE — Progress Notes (Signed)
Subjective:  Patient ID: Jeremy Shannon, male    DOB: 05-Aug-1956  Age: 67 y.o. MRN: [id number]  CC: Hypertension, Hyperlipidemia, and Diabetes   HPI Jeremy Shannon presents for f/up ---  Discussed the use of AI scribe software for clinical note transcription with the patient, who gave verbal consent to proceed.  History of Present Illness   The patient, with a history of esophageal issues, diabetes, and hypertension, presents with a persistent cough that is reportedly not as severe as before. The cough is described as originating from the esophagus and is particularly noticeable at night during sleep. Occasionally, the cough is productive of clear phlegm. The patient denies chest pain, shortness of breath, and wheezing. He has been through two rounds of antibiotics for this issue.  The patient also reports belching and a tendency to eat late at night, which was previously identified as a potential cause of his symptoms. Despite advice to change dietary habits, the patient has struggled to maintain these changes. The patient denies any difficulty swallowing or any symptoms of heartburn or indigestion.  The patient has a history of smoking and has tried to quit using a patch, but without success. He continues to smoke outside, particularly when the weather is damp. The patient also has a history of bronchitis for which he used inhalers a few years ago, but he does not feel the need for an inhaler currently.  The patient's diabetes is reportedly well-controlled, with recent tests showing levels around 109. He has been trying to increase his water intake and has been working two days a week, although he reports needing about two days to recover after each workday. The patient has also been trying to maintain a regular exercise routine, including walking in water, but has recently gained weight.       Outpatient Medications Prior to Visit  Medication Sig Dispense Refill   doxepin (SINEQUAN)  150 MG capsule Take 1 capsule (150 mg total) by mouth at bedtime. 30 capsule 3   FLUoxetine (PROZAC) 20 MG capsule Take 20 mg by mouth daily.     lamoTRIgine (LAMICTAL) 25 MG tablet Take 50 mg by mouth daily.     OLANZapine (ZYPREXA) 10 MG tablet Take 10 mg by mouth at bedtime. At bedtime     OLANZapine (ZYPREXA) 5 MG tablet Take 5 mg by mouth in the morning and at bedtime. Once in the morning and once at noon     pantoprazole (PROTONIX) 40 MG tablet TAKE 1 TABLET(40 MG) BY MOUTH DAILY 90 tablet 0   clotrimazole-betamethasone (LOTRISONE) cream Apply 1 Application topically 2 (two) times daily. 30 g 0   fluconazole (DIFLUCAN) 200 MG tablet Take 1 tablet (200 mg total) by mouth daily. 14 tablet 0   influenza vaccine adjuvanted (FLUAD) 0.5 ML injection Inject into the muscle. 0.5 mL 0   simvastatin (ZOCOR) 40 MG tablet Take 1 tablet (40 mg total) by mouth at bedtime. 90 tablet 1   SUMAtriptan (IMITREX) 100 MG tablet Take 1 tablet (100 mg total) by mouth once for 1 dose. May repeat in 2 hours if headache persists or recurs.  Do not exceed 200 mg in 24 hours. 10 tablet 1   No facility-administered medications prior to visit.    ROS Review of Systems  Constitutional:  Negative for diaphoresis, fatigue and fever.  HENT: Negative.  Negative for trouble swallowing.   Respiratory:  Positive for cough. Negative for chest tightness, shortness of breath and wheezing.  Cardiovascular:  Negative for chest pain, palpitations and leg swelling.  Gastrointestinal:  Negative for abdominal pain, diarrhea, nausea and vomiting.  Genitourinary: Negative.  Negative for difficulty urinating.  Musculoskeletal: Negative.  Negative for arthralgias and myalgias.  Skin: Negative.   Neurological:  Negative for dizziness and weakness.  Hematological:  Negative for adenopathy. Does not bruise/bleed easily.  Psychiatric/Behavioral:  Positive for confusion and decreased concentration. Negative for sleep disturbance. The  patient is nervous/anxious.     Objective:  BP 132/78   Pulse 98   Temp 97.9 F (36.6 C) (Oral)   Resp 16   Ht 5\' 9"  (1.753 m)   Wt 233 lb (105.7 kg)   SpO2 94%   BMI 34.41 kg/m   BP Readings from Last 3 Encounters:  08/03/23 132/78  07/07/23 132/84  04/25/23 114/82    Wt Readings from Last 3 Encounters:  08/03/23 233 lb (105.7 kg)  07/07/23 226 lb (102.5 kg)  04/25/23 230 lb (104.3 kg)    Physical Exam Vitals reviewed.  Constitutional:      General: He is not in acute distress.    Appearance: He is ill-appearing. He is not toxic-appearing or diaphoretic.  HENT:     Mouth/Throat:     Mouth: Mucous membranes are moist.  Eyes:     General: No scleral icterus.    Conjunctiva/sclera: Conjunctivae normal.  Cardiovascular:     Rate and Rhythm: Normal rate and regular rhythm.     Heart sounds: No murmur heard.    No friction rub. No gallop.     Comments: EKG- NSR, 91 bpm No LVH, Q waves, or ST/T waves  Pulmonary:     Effort: Pulmonary effort is normal.     Breath sounds: No stridor. No wheezing, rhonchi or rales.  Abdominal:     General: Abdomen is flat.     Palpations: There is no mass.     Tenderness: There is no abdominal tenderness. There is no guarding.     Hernia: No hernia is present.  Musculoskeletal:        General: Normal range of motion.     Cervical back: Neck supple.     Right lower leg: No edema.     Left lower leg: No edema.  Lymphadenopathy:     Cervical: No cervical adenopathy.  Skin:    General: Skin is warm and dry.     Coloration: Skin is not pale.     Findings: No rash.  Neurological:     General: No focal deficit present.     Mental Status: He is alert. Mental status is at baseline.  Psychiatric:        Mood and Affect: Mood normal.        Behavior: Behavior normal.     Lab Results  Component Value Date   WBC 9.0 08/03/2023   HGB 15.7 08/03/2023   HCT 46.9 08/03/2023   PLT 348.0 08/03/2023   GLUCOSE 156 (H) 08/03/2023    CHOL 155 12/15/2022   TRIG 394.0 (H) 08/03/2023   HDL 40.40 12/15/2022   LDLDIRECT 81.0 12/15/2022   ALT 19 08/03/2023   AST 14 08/03/2023   NA 136 08/03/2023   K 3.8 08/03/2023   CL 103 08/03/2023   CREATININE 1.07 08/03/2023   BUN 16 08/03/2023   CO2 26 08/03/2023   TSH 2.41 12/15/2022   PSA 0.49 12/15/2022   HGBA1C 6.9 (H) 08/03/2023   MICROALBUR <0.7 12/15/2022    CT CHEST LUNG CA  SCREEN LOW DOSE W/O CM  Result Date: 11/13/2022 CLINICAL DATA:  Lung cancer screening. Current smoker with 32 pack-year history. EXAM: CT CHEST WITHOUT CONTRAST LOW-DOSE FOR LUNG CANCER SCREENING TECHNIQUE: Multidetector CT imaging of the chest was performed following the standard protocol without IV contrast. RADIATION DOSE REDUCTION: This exam was performed according to the departmental dose-optimization program which includes automated exposure control, adjustment of the mA and/or kV according to patient size and/or use of iterative reconstruction technique. COMPARISON:  10/05/2021 FINDINGS: Cardiovascular: Normal heart size. Mild aortic atherosclerosis and coronary artery calcifications. No pericardial effusion. Mediastinum/Nodes: No enlarged mediastinal, hilar, or axillary lymph nodes. Thyroid gland, trachea, and esophagus demonstrate no significant findings. Lungs/Pleura: Centrilobular emphysema. No pleural fluid or airspace disease. Scarring noted within the right lower lobe. No suspicious pulmonary nodule or mass. Upper Abdomen: No acute abnormality.  Cholecystectomy. Musculoskeletal: No chest wall mass or suspicious bone lesions identified. IMPRESSION: 1. Lung-RADS 1, negative. Continue annual screening with low-dose chest CT without contrast in 12 months. 2. Coronary artery calcifications. 3. Aortic Atherosclerosis (ICD10-I70.0) and Emphysema (ICD10-J43.9). Electronically Signed   By: Signa Kell M.D.   On: 11/13/2022 07:37  DG Chest 2 View  Result Date: 07/07/2023 CLINICAL DATA:  Cough for 8 days.  EXAM: CHEST - 2 VIEW COMPARISON:  February 16, 2021. FINDINGS: The heart size and mediastinal contours are within normal limits. Mild right basilar subsegmental atelectasis or scarring is noted. Left lung is clear. The visualized skeletal structures are unremarkable. IMPRESSION: Mild right basilar subsegmental atelectasis or scarring. Electronically Signed   By: Lupita Raider M.D.   On: 07/07/2023 15:50     Assessment & Plan:   Type II diabetes mellitus with complication (HCC)- His blood sugar is well controlled. -     Basic metabolic panel; Future -     Hemoglobin A1c; Future  Primary hypertension- EKG is negative for LVH. BP is well controlled. -     CBC with Differential/Platelet; Future -     Basic metabolic panel; Future -     EKG 12-Lead -     Hepatic function panel; Future  Pure hyperglyceridemia -     Triglycerides; Future -     Omega-3-acid Ethyl Esters; Take 2 capsules (2 g total) by mouth 2 (two) times daily.  Dispense: 360 capsule; Refill: 1  Hyperlipidemia with target LDL less than 100 - LDL goal achieved. Doing well on the statin  -     Hepatic function panel; Future -     CK; Future -     Simvastatin; Take 1 tablet (40 mg total) by mouth at bedtime.  Dispense: 90 tablet; Refill: 1     Follow-up: Return in about 6 months (around 01/31/2024).  Sanda Linger, MD

## 2023-08-03 NOTE — Patient Instructions (Signed)

## 2023-08-14 NOTE — Telephone Encounter (Signed)
I tried scheduling pt appointment but it is only landing  on Monday appointments but the wife states he can't do Mondays because he have to work.

## 2023-08-14 NOTE — Telephone Encounter (Signed)
  I tried scheduling pt appointment but it is only landing  on Monday appointments but the wife states he can't do Mondays because he have to work.

## 2023-08-14 NOTE — Telephone Encounter (Signed)
Lvm for patients wife to rtn my call to [phone]

## 2023-08-18 NOTE — Telephone Encounter (Signed)
Unable to reach patient. LMTRC  

## 2023-09-04 ENCOUNTER — Ambulatory Visit (INDEPENDENT_AMBULATORY_CARE_PROVIDER_SITE_OTHER): Payer: Medicare Other

## 2023-09-04 VITALS — BP 132/78 | HR 98 | Temp 97.9°F | Ht 69.0 in | Wt 233.0 lb

## 2023-09-04 DIAGNOSIS — Z0000 Encounter for general adult medical examination without abnormal findings: Secondary | ICD-10-CM | POA: Diagnosis not present

## 2023-09-04 NOTE — Patient Instructions (Signed)
It was great speaking with you today!  Please schedule your next Medicare Wellness Visit with your Nurse Health Advisor in 1 year by calling [phone]. 

## 2023-09-04 NOTE — Progress Notes (Signed)
Subjective:   Jeremy Shannon is a 67 y.o. male who presents for Medicare Annual/Subsequent preventive examination.  Visit Complete: Virtual I connected with  Jeremy Shannon on 09/04/23 by a audio enabled telemedicine application and verified that I am speaking with the correct person using two identifiers.  Patient Location: Home  Provider Location: Office/Clinic  I discussed the limitations of evaluation and management by telemedicine. The patient expressed understanding and agreed to proceed.  Vital Signs: Because this visit was a virtual/telehealth visit, some criteria may be missing or patient reported. Any vitals not documented were not able to be obtained and vitals that have been documented are patient reported.  Patient Medicare AWV questionnaire was completed by the patient on n/a; I have confirmed that all information answered by patient is correct and no changes since this date.  Cardiac Risk Factors include: advanced age (>16men, >52 women);diabetes mellitus;hypertension;male gender;obesity (BMI >30kg/m2);smoking/ tobacco exposure     Objective:    Today's Vitals   09/04/23 1038  BP: 132/78  Pulse: 98  Temp: 97.9 F (36.6 C)  SpO2: 94%  Weight: 233 lb (105.7 kg)  Height: 5\' 9"  (1.753 m)   Body mass index is 34.41 kg/m. Per patient no change in vitals since last visit, unable to obtain new vitals due to telehealth visit     09/04/2023   10:42 AM 01/13/2022   10:49 PM 12/31/2021    4:00 PM 12/29/2021   10:56 AM 09/16/2021   11:54 AM 03/26/2021    4:19 PM 03/26/2021    8:56 AM  Advanced Directives  Does Patient Have a Medical Advance Directive? Yes No  No Yes No Yes  Type of Merchandiser, retail of [place];Living will  Healthcare Power of [place];Living will  Does patient want to make changes to medical advance directive? No - Patient declined    No - Patient declined    Would patient like information on creating a medical advance  directive?  No - Patient declined    No - Patient declined No - Patient declined     Information is confidential and restricted. Go to Review Flowsheets to unlock data.    Current Medications (verified) Outpatient Encounter Medications as of 09/04/2023  Medication Sig   doxepin (SINEQUAN) 75 MG capsule Take 75 mg by mouth at bedtime.   FLUoxetine (PROZAC) 20 MG capsule Take 20 mg by mouth daily.   lamoTRIgine (LAMICTAL) 25 MG tablet Take 50 mg by mouth daily.   OLANZapine (ZYPREXA) 10 MG tablet Take 10 mg by mouth at bedtime. At bedtime   OLANZapine (ZYPREXA) 5 MG tablet Take 5 mg by mouth in the morning and at bedtime. Once in the morning and once at noon   omega-3 acid ethyl esters (LOVAZA) 1 g capsule Take 2 capsules (2 g total) by mouth 2 (two) times daily.   pantoprazole (PROTONIX) 40 MG tablet TAKE 1 TABLET(40 MG) BY MOUTH DAILY   simvastatin (ZOCOR) 40 MG tablet Take 1 tablet (40 mg total) by mouth at bedtime.   [DISCONTINUED] doxepin (SINEQUAN) 150 MG capsule Take 1 capsule (150 mg total) by mouth at bedtime.   SUMAtriptan (IMITREX) 100 MG tablet Take 1 tablet (100 mg total) by mouth once for 1 dose. May repeat in 2 hours if headache persists or recurs.  Do not exceed 200 mg in 24 hours.   No facility-administered encounter medications on file as of 09/04/2023.    Allergies (verified) Patient has no  known allergies.   History: Past Medical History:  Diagnosis Date   Anemia    pt denies   Anxiety    Cataract    Depression    Diabetes mellitus without complication (HCC)    type 2   Dyspnea    Hyperlipidemia    PONV (postoperative nausea and vomiting)    Past Surgical History:  Procedure Laterality Date   BRONCHIAL BIOPSY  02/16/2021   Procedure: BRONCHIAL BIOPSIES;  Surgeon: Josephine Igo, DO;  Location: [facility];  Service: Pulmonary;;   BRONCHIAL BRUSHINGS  02/16/2021   Procedure: BRONCHIAL BRUSHINGS;  Surgeon: Josephine Igo, DO;  Location: [facility];   Service: Pulmonary;;   BRONCHIAL NEEDLE ASPIRATION BIOPSY  02/16/2021   Procedure: BRONCHIAL NEEDLE ASPIRATION BIOPSIES;  Surgeon: Josephine Igo, DO;  Location: [facility];  Service: Pulmonary;;   BRONCHIAL WASHINGS  02/16/2021   Procedure: BRONCHIAL WASHINGS;  Surgeon: Josephine Igo, DO;  Location: [facility];  Service: Pulmonary;;   CATARACT EXTRACTION     bilateral   CHOLECYSTECTOMY N/A 02/10/2017   Procedure: LAPAROSCOPIC CHOLECYSTECTOMY WITH INTRAOPERATIVE CHOLANGIOGRAM;  Surgeon: Ovidio Kin, MD;  Location: [facility];  Service: General;  Laterality: N/A;   COLONOSCOPY     FIDUCIAL MARKER PLACEMENT  02/16/2021   Procedure: FIDUCIAL MARKER PLACEMENT;  Surgeon: Josephine Igo, DO;  Location: [facility];  Service: Pulmonary;;   VIDEO BRONCHOSCOPY WITH ENDOBRONCHIAL NAVIGATION Right 02/16/2021   Procedure: VIDEO BRONCHOSCOPY WITH ENDOBRONCHIAL NAVIGATION;  Surgeon: Josephine Igo, DO;  Location: [facility];  Service: Pulmonary;  Laterality: Right;   Family History  Problem Relation Age of Onset   Liver cancer Mother    Depression Father    Drug abuse Father    Social History   Socioeconomic History   Marital status: Married    Spouse name: Not on file   Number of children: Not on file   Years of education: Not on file   Highest education level: Not on file  Occupational History   Not on file  Tobacco Use   Smoking status: Every Day    Current packs/day: 1.00    Average packs/day: 1 pack/day for 43.0 years (43.0 ttl pk-yrs)    Types: Cigarettes    Passive exposure: Current   Smokeless tobacco: Never   Tobacco comments:    10-20 cigarettes smoked daily 02/17/2021  Vaping Use   Vaping status: Never Used  Substance and Sexual Activity   Alcohol use: No   Drug use: No   Sexual activity: Yes    Partners: Female    Birth control/protection: None  Other Topics Concern   Not on file  Social History Narrative   Patient is currently a smoker and states he is not ready to  quit   Social Drivers of Health   Financial Resource Strain: Low Risk  (09/04/2023)   Overall Financial Resource Strain (CARDIA)    Difficulty of Paying Living Expenses: Not hard at all  Food Insecurity: No Food Insecurity (09/04/2023)   Hunger Vital Sign    Worried About Running Out of Food in the Last Year: Never true    Ran Out of Food in the Last Year: Never true  Transportation Needs: No Transportation Needs (09/04/2023)   PRAPARE - Administrator, Civil Service (Medical): No    Lack of Transportation (Non-Medical): No  Physical Activity: Insufficiently Active (09/04/2023)   Exercise Vital Sign    Days of Exercise per Week: 3 days  Minutes of Exercise per Session: 40 min  Stress: No Stress Concern Present (09/04/2023)   [employer] of Occupational Health - Occupational Stress Questionnaire    Feeling of Stress : Not at all  Social Connections: Moderately Integrated (09/04/2023)   Social Connection and Isolation Panel [NHANES]    Frequency of Communication with Friends and Family: Three times a week    Frequency of Social Gatherings with Friends and Family: Three times a week    Attends Religious Services: More than 4 times per year    Active Member of Clubs or Organizations: No    Attends Banker Meetings: Never    Marital Status: Married    Tobacco Counseling Ready to quit: Not Answered Counseling given: Not Answered Tobacco comments: 10-20 cigarettes smoked daily 02/17/2021   Clinical Intake:  Pre-visit preparation completed: Yes  Pain : No/denies pain     BMI - recorded: 34 Nutritional Status: BMI > 30  Obese Nutritional Risks: None Diabetes: Yes CBG done?: No Did pt. bring in CBG monitor from home?: No  How often do you need to have someone help you when you read instructions, pamphlets, or other written materials from your doctor or pharmacy?: 1 - Never  Interpreter Needed?: No  Information entered by :: Elyse Jarvis,  CMA   Activities of Daily Living    09/04/2023   10:43 AM  In your present state of health, do you have any difficulty performing the following activities:  Hearing? 0  Vision? 0  Difficulty concentrating or making decisions? 1  Walking or climbing stairs? 0  Dressing or bathing? 0  Doing errands, shopping? 0  Preparing Food and eating ? N  Using the Toilet? N  In the past six months, have you accidently leaked urine? N  Do you have problems with loss of bowel control? N  Managing your Medications? Y  Managing your Finances? N  Housekeeping or managing your Housekeeping? N    Patient Care Team: Etta Grandchild, MD as PCP - General (Internal Medicine) Ovidio Kin, MD as Consulting Physician (General Surgery) [facility] (Ophthalmology)  Indicate any recent Medical Services you may have received from other than [facility] providers in the past year (date may be approximate).     Assessment:   This is a routine wellness examination for Janel.  Hearing/Vision screen Patient denied any hearing difficulty. No hearing aids. Patient wears reading glasses.   Goals Addressed             This Visit's Progress    Declined       Patient declined a goal at this time.       Depression Screen    09/04/2023   10:42 AM 08/03/2023   11:15 AM 04/25/2023   10:53 AM 12/15/2022    2:20 PM 06/21/2022    3:47 PM 04/26/2022    9:57 AM 04/25/2022    3:01 PM  PHQ 2/9 Scores  PHQ - 2 Score 0 1 0 2 0 4 2  PHQ- 9 Score  3 0 3  5     Fall Risk    08/03/2023   11:15 AM 04/25/2023   10:52 AM 06/21/2022    3:47 PM 04/25/2022    3:01 PM 03/03/2021    1:27 PM  Fall Risk   Falls in the past year? 0 0 0 0 0  Number falls in past yr: 0 0 0 0   Injury with Fall? 0 0 0 0  Risk for fall due to : No Fall Risks No Fall Risks No Fall Risks    Follow up Falls evaluation completed Falls evaluation completed Falls evaluation completed Falls evaluation completed     MEDICARE RISK  AT HOME: Medicare Risk at Home Any stairs in or around the home?: No If so, are there any without handrails?: No Home free of loose throw rugs in walkways, pet beds, electrical cords, etc?: Yes Adequate lighting in your home to reduce risk of falls?: Yes Life alert?: No Use of a cane, walker or w/c?: No Grab bars in the bathroom?: Yes Shower chair or bench in shower?: No Elevated toilet seat or a handicapped toilet?: No  TIMED UP AND GO:  Was the test performed?  No    Cognitive Function:  Patient is cogitatively intact.      09/04/2023   10:44 AM  6CIT Screen  What Year? 0 points  What month? 0 points  What time? 0 points  Count back from 20 0 points  Months in reverse 0 points  Repeat phrase 0 points  Total Score 0 points    Immunizations Immunization History  Administered Date(s) Administered   Fluad Quad(high Dose 65+) 06/14/2022   Fluad Trivalent(High Dose 65+) 07/26/2023   Influenza-Unspecified 05/13/2020, 05/26/2020, 07/13/2021   PFIZER(Purple Top)SARS-COV-2 Vaccination 11/08/2019, 12/04/2019, 06/10/2020   PNEUMOCOCCAL CONJUGATE-20 11/11/2021   Pfizer Covid-19 Vaccine Bivalent Booster 55yrs & up 07/13/2021   Pfizer(Comirnaty)Fall Seasonal Vaccine 12 years and older 06/28/2022, 06/08/2023   Pneumococcal-Unspecified 11/11/2020    TDAP status: Due, Education has been provided regarding the importance of this vaccine. Advised may receive this vaccine at local pharmacy or Health Dept. Aware to provide a copy of the vaccination record if obtained from local pharmacy or Health Dept. Verbalized acceptance and understanding.  Flu Vaccine status: Up to date  Pneumococcal vaccine status: Up to date  Covid-19 vaccine status: Completed vaccines  Qualifies for Shingles Vaccine? Yes   Zostavax completed No   Shingrix Completed?: No.    Education has been provided regarding the importance of this vaccine. Patient has been advised to call insurance company to determine  out of pocket expense if they have not yet received this vaccine. Advised may also receive vaccine at local pharmacy or Health Dept. Verbalized acceptance and understanding.  Screening Tests Health Maintenance  Topic Date Due   DTaP/Tdap/Td (1 - Tdap) Never done   Zoster Vaccines- Shingrix (1 of 2) Never done   Medicare Annual Wellness (AWV)  06/14/2023   COVID-19 Vaccine (7 - 2024-25 season) 09/20/2023 (Originally 08/03/2023)   Lung Cancer Screening  11/11/2023   OPHTHALMOLOGY EXAM  11/30/2023   Diabetic kidney evaluation - Urine ACR  12/15/2023   FOOT EXAM  12/15/2023   HEMOGLOBIN A1C  01/31/2024   Diabetic kidney evaluation - eGFR measurement  08/02/2024   Colonoscopy  02/02/2028   Pneumonia Vaccine 61+ Years old  Completed   INFLUENZA VACCINE  Completed   Hepatitis C Screening  Completed   HPV VACCINES  Aged Out    Health Maintenance  Health Maintenance Due  Topic Date Due   DTaP/Tdap/Td (1 - Tdap) Never done   Zoster Vaccines- Shingrix (1 of 2) Never done   Medicare Annual Wellness (AWV)  06/14/2023    Colorectal cancer screening: Type of screening: Colonoscopy. Completed 02/01/2018. Repeat every 10 years  Lung Cancer Screening: (Low Dose CT Chest recommended if Age 12-80 years, 20 pack-year currently smoking OR have quit w/in 15years.) does qualify.  Lung Cancer Screening Referral: upcoming  Additional Screening:  Hepatitis C Screening: does qualify; Completed 04/26/2022  Vision Screening: Recommended annual ophthalmology exams for early detection of glaucoma and other disorders of the eye. Is the patient up to date with their annual eye exam?  Yes  Who is the provider or what is the name of the office in which the patient attends annual eye exams? [facility] If pt is not established with a provider, would they like to be referred to a provider to establish care? No .   Dental Screening: Recommended annual dental exams for proper oral hygiene  Diabetic Foot Exam:  Diabetic Foot Exam: Completed 12/15/2022  Community Resource Referral / Chronic Care Management: CRR required this visit?  No   CCM required this visit?  No     Plan:     I have personally reviewed and noted the following in the patient's chart:   Medical and social history Use of alcohol, tobacco or illicit drugs  Current medications and supplements including opioid prescriptions. Patient is not currently taking opioid prescriptions. Functional ability and status Nutritional status Physical activity Advanced directives List of other physicians Hospitalizations, surgeries, and ER visits in previous 12 months Vitals Screenings to include cognitive, depression, and falls Referrals and appointments  In addition, I have reviewed and discussed with patient certain preventive protocols, quality metrics, and best practice recommendations. A written personalized care plan for preventive services as well as general preventive health recommendations were provided to patient.     Marinus Maw, CMA   09/04/2023   After Visit Summary: (MyChart) Due to this being a telephonic visit, the after visit summary with patients personalized plan was offered to patient via MyChart   Nurse Notes: n/a

## 2023-09-27 DIAGNOSIS — Z1211 Encounter for screening for malignant neoplasm of colon: Secondary | ICD-10-CM | POA: Diagnosis not present

## 2023-09-27 DIAGNOSIS — B3781 Candidal esophagitis: Secondary | ICD-10-CM | POA: Diagnosis not present

## 2023-09-27 DIAGNOSIS — R1013 Epigastric pain: Secondary | ICD-10-CM | POA: Diagnosis not present

## 2023-10-11 ENCOUNTER — Ambulatory Visit: Payer: Medicare Other | Admitting: Family Medicine

## 2023-11-05 ENCOUNTER — Other Ambulatory Visit: Payer: Self-pay | Admitting: Acute Care

## 2023-11-05 DIAGNOSIS — F17210 Nicotine dependence, cigarettes, uncomplicated: Secondary | ICD-10-CM

## 2023-11-05 DIAGNOSIS — Z87891 Personal history of nicotine dependence: Secondary | ICD-10-CM

## 2023-11-05 DIAGNOSIS — Z122 Encounter for screening for malignant neoplasm of respiratory organs: Secondary | ICD-10-CM

## 2023-11-16 ENCOUNTER — Ambulatory Visit (HOSPITAL_COMMUNITY)
Admission: RE | Admit: 2023-11-16 | Discharge: 2023-11-16 | Disposition: A | Discharge status: Home / Self Care | Payer: Medicare Other | Source: Ambulatory Visit | Attending: Acute Care | Admitting: Acute Care

## 2023-11-16 DIAGNOSIS — Z122 Encounter for screening for malignant neoplasm of respiratory organs: Secondary | ICD-10-CM | POA: Insufficient documentation

## 2023-11-16 DIAGNOSIS — F17210 Nicotine dependence, cigarettes, uncomplicated: Secondary | ICD-10-CM | POA: Insufficient documentation

## 2023-11-16 DIAGNOSIS — Z87891 Personal history of nicotine dependence: Secondary | ICD-10-CM | POA: Diagnosis not present

## 2023-12-04 ENCOUNTER — Telehealth: Payer: Self-pay | Admitting: Internal Medicine

## 2023-12-04 NOTE — Telephone Encounter (Unsigned)
 Copied from CRM [id number]. Topic: Clinical - Lab/Test Results >> Dec 01, 2023  2:27 PM Elizebeth Brooking wrote: Reason for CRM: Patient wife called in stating that he had a ct scan on 03/06 and hasn't have anyone call yet regarding results is wanting someone to give her a callback

## 2023-12-04 NOTE — Telephone Encounter (Signed)
 Patients wife has been made aware to reach out to the pulmonary office due to them ordering the test. She gave a verbal understanding.

## 2023-12-13 ENCOUNTER — Other Ambulatory Visit: Payer: Self-pay

## 2023-12-13 DIAGNOSIS — Z87891 Personal history of nicotine dependence: Secondary | ICD-10-CM

## 2023-12-13 DIAGNOSIS — Z122 Encounter for screening for malignant neoplasm of respiratory organs: Secondary | ICD-10-CM

## 2023-12-13 DIAGNOSIS — F17210 Nicotine dependence, cigarettes, uncomplicated: Secondary | ICD-10-CM

## 2024-01-22 ENCOUNTER — Other Ambulatory Visit: Payer: Self-pay | Admitting: Internal Medicine

## 2024-01-22 DIAGNOSIS — E785 Hyperlipidemia, unspecified: Secondary | ICD-10-CM

## 2024-04-24 ENCOUNTER — Ambulatory Visit (INDEPENDENT_AMBULATORY_CARE_PROVIDER_SITE_OTHER)

## 2024-04-24 ENCOUNTER — Encounter: Payer: Self-pay | Admitting: Internal Medicine

## 2024-04-24 ENCOUNTER — Other Ambulatory Visit (HOSPITAL_BASED_OUTPATIENT_CLINIC_OR_DEPARTMENT_OTHER): Payer: Self-pay

## 2024-04-24 ENCOUNTER — Ambulatory Visit: Admitting: Internal Medicine

## 2024-04-24 ENCOUNTER — Ambulatory Visit: Payer: Self-pay | Admitting: Internal Medicine

## 2024-04-24 VITALS — BP 118/72 | HR 66 | Temp 98.1°F | Resp 16 | Ht 69.0 in | Wt 224.0 lb

## 2024-04-24 DIAGNOSIS — R058 Other specified cough: Secondary | ICD-10-CM

## 2024-04-24 DIAGNOSIS — Z125 Encounter for screening for malignant neoplasm of prostate: Secondary | ICD-10-CM | POA: Diagnosis not present

## 2024-04-24 DIAGNOSIS — I10 Essential (primary) hypertension: Secondary | ICD-10-CM | POA: Diagnosis not present

## 2024-04-24 DIAGNOSIS — E781 Pure hyperglyceridemia: Secondary | ICD-10-CM | POA: Diagnosis not present

## 2024-04-24 DIAGNOSIS — Z0001 Encounter for general adult medical examination with abnormal findings: Secondary | ICD-10-CM

## 2024-04-24 DIAGNOSIS — R918 Other nonspecific abnormal finding of lung field: Secondary | ICD-10-CM | POA: Diagnosis not present

## 2024-04-24 DIAGNOSIS — R059 Cough, unspecified: Secondary | ICD-10-CM | POA: Diagnosis not present

## 2024-04-24 DIAGNOSIS — E785 Hyperlipidemia, unspecified: Secondary | ICD-10-CM

## 2024-04-24 DIAGNOSIS — J22 Unspecified acute lower respiratory infection: Secondary | ICD-10-CM | POA: Diagnosis not present

## 2024-04-24 DIAGNOSIS — J431 Panlobular emphysema: Secondary | ICD-10-CM | POA: Diagnosis not present

## 2024-04-24 DIAGNOSIS — E118 Type 2 diabetes mellitus with unspecified complications: Secondary | ICD-10-CM

## 2024-04-24 DIAGNOSIS — R9431 Abnormal electrocardiogram [ECG] [EKG]: Secondary | ICD-10-CM | POA: Insufficient documentation

## 2024-04-24 DIAGNOSIS — I959 Hypotension, unspecified: Secondary | ICD-10-CM

## 2024-04-24 DIAGNOSIS — Z23 Encounter for immunization: Secondary | ICD-10-CM | POA: Insufficient documentation

## 2024-04-24 LAB — LIPID PANEL
Cholesterol: 163 mg/dL (ref 0–200)
HDL: 34.9 mg/dL — ABNORMAL LOW (ref >39.00)
LDL Cholesterol: 78 mg/dL (ref 0–99)
NonHDL: 128.45
Total CHOL/HDL Ratio: 5
Triglycerides: 253 mg/dL — ABNORMAL HIGH (ref 0.0–149.0)
VLDL: 50.6 mg/dL — ABNORMAL HIGH (ref 0.0–40.0)

## 2024-04-24 LAB — CBC WITH DIFFERENTIAL/PLATELET
Basophils Absolute: 0.1 K/uL (ref 0.0–0.1)
Basophils Relative: 0.9 % (ref 0.0–3.0)
Eosinophils Absolute: 0.1 K/uL (ref 0.0–0.7)
Eosinophils Relative: 0.8 % (ref 0.0–5.0)
HCT: 48 % (ref 39.0–52.0)
Hemoglobin: 16.4 g/dL (ref 13.0–17.0)
Lymphocytes Relative: 30.4 % (ref 12.0–46.0)
Lymphs Abs: 2.6 K/uL (ref 0.7–4.0)
MCHC: 34.2 g/dL (ref 30.0–36.0)
MCV: 93.9 fl (ref 78.0–100.0)
Monocytes Absolute: 0.6 K/uL (ref 0.1–1.0)
Monocytes Relative: 6.7 % (ref 3.0–12.0)
Neutro Abs: 5.2 K/uL (ref 1.4–7.7)
Neutrophils Relative %: 61.2 % (ref 43.0–77.0)
Platelets: 327 K/uL (ref 150.0–400.0)
RBC: 5.11 Mil/uL (ref 4.22–5.81)
RDW: 13.4 % (ref 11.5–15.5)
WBC: 8.4 K/uL (ref 4.0–10.5)

## 2024-04-24 LAB — PSA: PSA: 0.42 ng/mL (ref 0.10–4.00)

## 2024-04-24 LAB — BASIC METABOLIC PANEL WITH GFR
BUN: 14 mg/dL (ref 6–23)
CO2: 28 meq/L (ref 19–32)
Calcium: 9 mg/dL (ref 8.4–10.5)
Chloride: 102 meq/L (ref 96–112)
Creatinine, Ser: 1.09 mg/dL (ref 0.40–1.50)
GFR: 68.86 mL/min (ref >60.00)
Glucose, Bld: 89 mg/dL (ref 70–99)
Potassium: 4.4 meq/L (ref 3.5–5.1)
Sodium: 138 meq/L (ref 135–145)

## 2024-04-24 LAB — MICROALBUMIN / CREATININE URINE RATIO
Creatinine,U: 179.9 mg/dL
Microalb Creat Ratio: 5.5 mg/g (ref 0.0–30.0)
Microalb, Ur: 1 mg/dL (ref 0.0–1.9)

## 2024-04-24 LAB — D-DIMER, QUANTITATIVE: D-Dimer, Quant: 0.33 ug{FEU}/mL (ref <0.50)

## 2024-04-24 LAB — HEPATIC FUNCTION PANEL
ALT: 18 U/L (ref 0–53)
AST: 15 U/L (ref 0–37)
Albumin: 4.1 g/dL (ref 3.5–5.2)
Alkaline Phosphatase: 81 U/L (ref 39–117)
Bilirubin, Direct: 0.1 mg/dL (ref 0.0–0.3)
Total Bilirubin: 0.4 mg/dL (ref 0.2–1.2)
Total Protein: 7 g/dL (ref 6.0–8.3)

## 2024-04-24 LAB — HEMOGLOBIN A1C: Hgb A1c MFr Bld: 7.1 % — ABNORMAL HIGH (ref 4.6–6.5)

## 2024-04-24 LAB — BRAIN NATRIURETIC PEPTIDE: Pro B Natriuretic peptide (BNP): 12 pg/mL (ref 0.0–100.0)

## 2024-04-24 LAB — CK: Total CK: 111 U/L (ref 17–232)

## 2024-04-24 LAB — CORTISOL: Cortisol, Plasma: 4.8 ug/dL

## 2024-04-24 LAB — TSH: TSH: 1.69 u[IU]/mL (ref 0.35–5.50)

## 2024-04-24 LAB — TROPONIN I (HIGH SENSITIVITY): High Sens Troponin I: 3 ng/L (ref 2–17)

## 2024-04-24 MED ORDER — ROSUVASTATIN CALCIUM 20 MG PO TABS
20.0000 mg | ORAL_TABLET | Freq: Every day | ORAL | 1 refills | Status: AC
  Filled 2024-04-24 (×2): qty 90, 90d supply, fill #0
  Filled 2024-07-10: qty 90, 90d supply, fill #1

## 2024-04-24 MED ORDER — SHINGRIX 50 MCG/0.5ML IM SUSR
0.5000 mL | Freq: Once | INTRAMUSCULAR | 1 refills | Status: AC
  Filled 2024-04-24 (×2): qty 0.5, 1d supply, fill #0

## 2024-04-24 MED ORDER — BOOSTRIX 5-2.5-18.5 LF-MCG/0.5 IM SUSP
0.5000 mL | Freq: Once | INTRAMUSCULAR | 0 refills | Status: AC
  Filled 2024-04-24 (×2): qty 0.5, 1d supply, fill #0

## 2024-04-24 MED ORDER — CEFPODOXIME PROXETIL 200 MG PO TABS
200.0000 mg | ORAL_TABLET | Freq: Two times a day (BID) | ORAL | 0 refills | Status: AC
  Filled 2024-04-24 (×2): qty 14, 7d supply, fill #0

## 2024-04-24 NOTE — Patient Instructions (Signed)
 Health Maintenance, Male  Adopting a healthy lifestyle and getting preventive care are important in promoting health and wellness. Ask your health care provider about:  The right schedule for you to have regular tests and exams.  Things you can do on your own to prevent diseases and keep yourself healthy.  What should I know about diet, weight, and exercise?  Eat a healthy diet    Eat a diet that includes plenty of vegetables, fruits, low-fat dairy products, and lean protein.  Do not eat a lot of foods that are high in solid fats, added sugars, or sodium.  Maintain a healthy weight  Body mass index (BMI) is a measurement that can be used to identify possible weight problems. It estimates body fat based on height and weight. Your health care provider can help determine your BMI and help you achieve or maintain a healthy weight.  Get regular exercise  Get regular exercise. This is one of the most important things you can do for your health. Most adults should:  Exercise for at least 150 minutes each week. The exercise should increase your heart rate and make you sweat (moderate-intensity exercise).  Do strengthening exercises at least twice a week. This is in addition to the moderate-intensity exercise.  Spend less time sitting. Even light physical activity can be beneficial.  Watch cholesterol and blood lipids  Have your blood tested for lipids and cholesterol at 70 years of age, then have this test every 5 years.  You may need to have your cholesterol levels checked more often if:  Your lipid or cholesterol levels are high.  You are older than 70 years of age.  You are at high risk for heart disease.  What should I know about cancer screening?  Many types of cancers can be detected early and may often be prevented. Depending on your health history and family history, you may need to have cancer screening at various ages. This may include screening for:  Colorectal cancer.  Prostate cancer.  Skin cancer.  Lung  cancer.  What should I know about heart disease, diabetes, and high blood pressure?  Blood pressure and heart disease  High blood pressure causes heart disease and increases the risk of stroke. This is more likely to develop in people who have high blood pressure readings or are overweight.  Talk with your health care provider about your target blood pressure readings.  Have your blood pressure checked:  Every 3-5 years if you are 24-52 years of age.  Every year if you are 3 years old or older.  If you are between the ages of 60 and 72 and are a current or former smoker, ask your health care provider if you should have a one-time screening for abdominal aortic aneurysm (AAA).  Diabetes  Have regular diabetes screenings. This checks your fasting blood sugar level. Have the screening done:  Once every three years after age 66 if you are at a normal weight and have a low risk for diabetes.  More often and at a younger age if you are overweight or have a high risk for diabetes.  What should I know about preventing infection?  Hepatitis B  If you have a higher risk for hepatitis B, you should be screened for this virus. Talk with your health care provider to find out if you are at risk for hepatitis B infection.  Hepatitis C  Blood testing is recommended for:  Everyone born from [birth year] through 1967.  Anyone  with known risk factors for hepatitis C.  Sexually transmitted infections (STIs)  You should be screened each year for STIs, including gonorrhea and chlamydia, if:  You are sexually active and are younger than 70 years of age.  You are older than 70 years of age and your health care provider tells you that you are at risk for this type of infection.  Your sexual activity has changed since you were last screened, and you are at increased risk for chlamydia or gonorrhea. Ask your health care provider if you are at risk.  Ask your health care provider about whether you are at high risk for HIV. Your health care provider  may recommend a prescription medicine to help prevent HIV infection. If you choose to take medicine to prevent HIV, you should first get tested for HIV. You should then be tested every 3 months for as long as you are taking the medicine.  Follow these instructions at home:  Alcohol use  Do not drink alcohol if your health care provider tells you not to drink.  If you drink alcohol:  Limit how much you have to 0-2 drinks a day.  Know how much alcohol is in your drink. In the [place] one drink equals one 12 oz bottle of beer (355 mL), one 5 oz glass of wine (148 mL), or one 1 oz glass of hard liquor (44 mL).  Lifestyle  Do not use any products that contain nicotine or tobacco. These products include cigarettes, chewing tobacco, and vaping devices, such as e-cigarettes. If you need help quitting, ask your health care provider.  Do not use street drugs.  Do not share needles.  Ask your health care provider for help if you need support or information about quitting drugs.  General instructions  Schedule regular health, dental, and eye exams.  Stay current with your vaccines.  Tell your health care provider if:  You often feel depressed.  You have ever been abused or do not feel safe at home.  Summary  Adopting a healthy lifestyle and getting preventive care are important in promoting health and wellness.  Follow your health care provider's instructions about healthy diet, exercising, and getting tested or screened for diseases.  Follow your health care provider's instructions on monitoring your cholesterol and blood pressure.  This information is not intended to replace advice given to you by your health care provider. Make sure you discuss any questions you have with your health care provider.  Document Revised: 01/18/2021 Document Reviewed: 01/18/2021  Elsevier Patient Education  2024 [employer].

## 2024-04-24 NOTE — Progress Notes (Signed)
 Subjective:  Patient ID: Jeremy Shannon, male    DOB: 1955/12/09  Age: 67 y.o. MRN: [id number]  CC: Annual Exam, Diabetes, Hyperlipidemia, Gastroesophageal Reflux, Cough, and COPD   HPI Jeremy Shannon presents for a CPX and f/up ----  Discussed the use of AI scribe software for clinical note transcription with the patient, who gave verbal consent to proceed.  History of Present Illness Jeremy Shannon is a 68 year old male with tardive dyskinesia and acid reflux who presents with concerns about medication side effects and management.  He has a history of tardive dyskinesia. He was previously prescribed Ingrezza, starting with a low dose, which was intended to be increased. However, he experienced gastrointestinal side effects that worsened over time, leading to discontinuation of the medication. Despite weekly follow-ups with the Ingrezza team, no improvement was noted, and the medication was eventually stopped.  He also has a history of acid reflux, previously associated with nighttime coughing. This was evaluated by a specialist and was thought to be related to a yeast infection in the esophagus.  His blood pressure has been noted to be low, and his blood glucose levels have increased from a typical range of 120-121 to 150s, even before eating. No excessive thirst, urination, dizziness, lightheadedness, chest pain, or shortness of breath. He reports a slight cough with phlegm for about a week.  He has a history of sleep apnea, confirmed during an endoscopy, but he has refused treatment for it. His spouse notes increased sweating during the summer, likely due to humidity.  Socially, he does not consume alcohol but has increased cigarette smoking, despite previous attempts to quit using nicotine  patches, which reportedly raised his blood pressure.    Outpatient Medications Prior to Visit  Medication Sig Dispense Refill   Armodafinil 150 MG tablet Take 150 mg by mouth every  morning.     doxepin  (SINEQUAN ) 75 MG capsule Take 75 mg by mouth at bedtime.     FLUoxetine  (PROZAC ) 20 MG capsule Take 20 mg by mouth daily.     lamoTRIgine (LAMICTAL) 25 MG tablet Take 50 mg by mouth daily.     LYBALVI 10-10 MG TABS Take 1 tablet by mouth every morning.     omega-3 acid ethyl esters (LOVAZA ) 1 g capsule Take 2 capsules (2 g total) by mouth 2 (two) times daily. 360 capsule 1   pantoprazole  (PROTONIX ) 40 MG tablet TAKE 1 TABLET(40 MG) BY MOUTH DAILY 90 tablet 0   simvastatin  (ZOCOR ) 40 MG tablet TAKE 1 TABLET(40 MG) BY MOUTH AT BEDTIME 90 tablet 0   OLANZapine (ZYPREXA) 10 MG tablet Take 10 mg by mouth at bedtime. At bedtime     OLANZapine (ZYPREXA) 5 MG tablet Take 5 mg by mouth in the morning and at bedtime. Once in the morning and once at noon     SUMAtriptan  (IMITREX ) 100 MG tablet Take 1 tablet (100 mg total) by mouth once for 1 dose. May repeat in 2 hours if headache persists or recurs.  Do not exceed 200 mg in 24 hours. 10 tablet 1   No facility-administered medications prior to visit.    ROS Review of Systems  Constitutional:  Positive for fatigue and unexpected weight change (wt loss). Negative for appetite change, chills and diaphoresis.  HENT: Negative.  Negative for sore throat and trouble swallowing.   Eyes:  Negative for visual disturbance.  Respiratory:  Positive for apnea, cough and shortness of breath. Negative for choking, chest tightness and  wheezing.   Cardiovascular:  Negative for chest pain, palpitations and leg swelling.  Gastrointestinal: Negative.  Negative for abdominal pain, constipation, diarrhea, nausea and vomiting.  Endocrine: Negative.   Genitourinary: Negative.  Negative for difficulty urinating and dysuria.  Musculoskeletal: Negative.  Negative for arthralgias and myalgias.  Skin: Negative.   Neurological:  Negative for dizziness, syncope, weakness, light-headedness and headaches.  Hematological:  Negative for adenopathy. Does not  bruise/bleed easily.  Psychiatric/Behavioral:  Positive for confusion, decreased concentration and dysphoric mood. Negative for agitation, behavioral problems, hallucinations and sleep disturbance. The patient is not nervous/anxious and is not hyperactive.     Objective:  BP 118/72 (BP Location: Left Arm, Patient Position: Sitting, Cuff Size: Normal)   Pulse 66   Temp 98.1 F (36.7 C) (Oral)   Resp 16   Ht 5' 9 (1.753 m)   Wt 224 lb (101.6 kg)   SpO2 94%   BMI 33.08 kg/m   BP Readings from Last 3 Encounters:  04/24/24 118/72  09/04/23 132/78  08/03/23 132/78    Wt Readings from Last 3 Encounters:  04/24/24 224 lb (101.6 kg)  09/04/23 233 lb (105.7 kg)  08/03/23 233 lb (105.7 kg)    Physical Exam Vitals reviewed. Exam conducted with a chaperone present (his wife).  Constitutional:      General: He is not in acute distress.    Appearance: He is ill-appearing. He is not toxic-appearing or diaphoretic.     Comments: Grunting, disheveled  HENT:     Mouth/Throat:     Mouth: Mucous membranes are moist.  Eyes:     General: No scleral icterus.    Conjunctiva/sclera: Conjunctivae normal.  Cardiovascular:     Rate and Rhythm: Normal rate and regular rhythm.     Heart sounds: No murmur heard.    No friction rub. No gallop.     Comments: EKG- NSR, 79 bpm NS T wave changes - new No LVH or Q waves Pulmonary:     Breath sounds: No stridor. No wheezing, rhonchi or rales.  Abdominal:     General: Abdomen is protuberant. There is no distension.     Palpations: There is no hepatomegaly, splenomegaly or mass.     Tenderness: There is no abdominal tenderness. There is no right CVA tenderness or guarding.     Hernia: No hernia is present. There is no hernia in the left inguinal area or right inguinal area.  Genitourinary:    Penis: Normal and circumcised.      Testes: Normal.     Epididymis:     Right: Normal.     Left: Normal.     Prostate: Normal. Not enlarged, not tender and  no nodules present.     Rectum: Normal. Guaiac result negative. No mass, tenderness, anal fissure, external hemorrhoid or internal hemorrhoid. Normal anal tone.  Musculoskeletal:        General: Normal range of motion.     Cervical back: Neck supple.     Right lower leg: No edema.     Left lower leg: No edema.  Lymphadenopathy:     Cervical: No cervical adenopathy.     Lower Body: No right inguinal adenopathy. No left inguinal adenopathy.  Skin:    General: Skin is warm and dry.     Coloration: Skin is not pale.     Findings: No rash.  Neurological:     General: No focal deficit present.     Mental Status: He is alert.  Psychiatric:  Mood and Affect: Mood normal.        Behavior: Behavior normal.     Lab Results  Component Value Date   WBC 8.4 04/24/2024   HGB 16.4 04/24/2024   HCT 48.0 04/24/2024   PLT 327.0 04/24/2024   GLUCOSE 89 04/24/2024   CHOL 163 04/24/2024   TRIG 253.0 (H) 04/24/2024   HDL 34.90 (L) 04/24/2024   LDLDIRECT 81.0 12/15/2022   LDLCALC 78 04/24/2024   ALT 18 04/24/2024   AST 15 04/24/2024   NA 138 04/24/2024   K 4.4 04/24/2024   CL 102 04/24/2024   CREATININE 1.09 04/24/2024   BUN 14 04/24/2024   CO2 28 04/24/2024   TSH 1.69 04/24/2024   PSA 0.42 04/24/2024   HGBA1C 7.1 (H) 04/24/2024   MICROALBUR 1.0 04/24/2024    CT CHEST LUNG CA SCREEN LOW DOSE W/O CM Result Date: 12/12/2023 CLINICAL DATA:  43 pack-year smoking history/current smoker EXAM: CT CHEST WITHOUT CONTRAST LOW-DOSE FOR LUNG CANCER SCREENING TECHNIQUE: Multidetector CT imaging of the chest was performed following the standard protocol without IV contrast. RADIATION DOSE REDUCTION: This exam was performed according to the departmental dose-optimization program which includes automated exposure control, adjustment of the mA and/or kV according to patient size and/or use of iterative reconstruction technique. COMPARISON:  11/11/2022 FINDINGS: Cardiovascular: Aortic atherosclerosis.  Tortuous thoracic aorta. Normal heart size, without pericardial effusion. Lad coronary artery calcification. Mediastinum/Nodes: No mediastinal or hilar adenopathy, given limitations of unenhanced CT. Lungs/Pleura: No pleural fluid. Right base scarring. Mild centrilobular emphysema. No suspicious pulmonary nodule or mass. Upper Abdomen: Normal imaged portions of the liver, spleen, stomach, adrenal glands, kidneys. Cholecystectomy. Musculoskeletal: Lower cervical spondylosis. IMPRESSION: Lung-RADS 1, negative. Continue annual screening with low-dose chest CT without contrast in 12 months. Aortic atherosclerosis (ICD10-I70.0), coronary artery atherosclerosis and emphysema (ICD10-J43.9). Electronically Signed   By: Rockey Kilts M.D.   On: 12/12/2023 16:35   No results found.    Assessment & Plan:  Prostate cancer screening -     PSA; Future  Pure hyperglyceridemia -     Lipid panel; Future  Type II diabetes mellitus with complication (HCC)- His blood sugar is adequately well controlled. -     Microalbumin / creatinine urine ratio; Future -     Hemoglobin A1c; Future -     Basic metabolic panel with GFR; Future -     CBC with Differential/Platelet; Future -     HM Diabetes Foot Exam -     AMB Referral [facility] Care Management  Panlobular emphysema (HCC) -     CBC with Differential/Platelet; Future -     AMB Referral [facility] Care Management  Hyperlipidemia with target LDL less than 100- LDL goal achieved. Doing well on the statin. -     Lipid panel; Future -     TSH; Future -     Hepatic function panel; Future -     CK; Future -     Rosuvastatin  Calcium ; Take 1 tablet (20 mg total) by mouth daily.  Dispense: 90 tablet; Refill: 1 -     AMB Referral [facility] Care Management  Primary hypertension- His BP is well controlled. -     Basic metabolic panel with GFR; Future -     CBC with Differential/Platelet; Future  Hypotension, unspecified hypotension type- Labs are negative for secondary causes. -      EKG 12-Lead -     Cortisol; Future -     Brain natriuretic peptide; Future -  Troponin I (High Sensitivity); Future -     D-dimer, quantitative; Future  Cough productive of purulent sputum -     DG Chest 2 View; Future  Encounter for general adult medical examination with abnormal findings- Exam completed, labs reviewed, vaccines reviewed and updated, cancer screenings addressed, pt ed material was given.   Abnormal electrocardiogram (ECG) (EKG)- Will evaluate for ischemia. -     Brain natriuretic peptide; Future -     Troponin I (High Sensitivity); Future -     D-dimer, quantitative; Future  Need for prophylactic vaccination with combined diphtheria-tetanus-pertussis (DTP) vaccine -     Boostrix ; Inject 0.5 mLs into the muscle once for 1 dose.  Dispense: 0.5 mL; Refill: 0  Need for prophylactic vaccination and inoculation against varicella -     Shingrix ; Inject 0.5 mLs into the muscle once for 1 dose.  Dispense: 0.5 mL; Refill: 1  LRTI (lower respiratory tract infection) -     Cefpodoxime  Proxetil; Take 1 tablet (200 mg total) by mouth 2 (two) times daily for 7 days.  Dispense: 14 tablet; Refill: 0  Abnormal electrocardiogram -     CT CORONARY MORPH W/CTA COR W/SCORE W/CA W/CM &/OR WO/CM; Future     Follow-up: Return in about 3 months (around 07/25/2024).  Debby Molt, MD

## 2024-04-25 ENCOUNTER — Other Ambulatory Visit (HOSPITAL_BASED_OUTPATIENT_CLINIC_OR_DEPARTMENT_OTHER): Payer: Self-pay

## 2024-04-27 ENCOUNTER — Encounter: Payer: Self-pay | Admitting: Internal Medicine

## 2024-04-30 ENCOUNTER — Encounter (HOSPITAL_COMMUNITY): Payer: Self-pay

## 2024-05-01 ENCOUNTER — Telehealth (HOSPITAL_COMMUNITY): Payer: Self-pay | Admitting: Emergency Medicine

## 2024-05-01 NOTE — Telephone Encounter (Signed)
 Attempted to call patient regarding upcoming cardiac CT appointment. Left message on voicemail with name and callback number Rockwell Alexandria RN Navigator Cardiac [facility] [phone] Office [phone] Cell

## 2024-05-02 ENCOUNTER — Ambulatory Visit (HOSPITAL_COMMUNITY)
Admission: RE | Admit: 2024-05-02 | Discharge: 2024-05-02 | Disposition: A | Discharge status: Home / Self Care | Source: Ambulatory Visit | Attending: Internal Medicine | Admitting: Internal Medicine

## 2024-05-02 DIAGNOSIS — R9431 Abnormal electrocardiogram [ECG] [EKG]: Secondary | ICD-10-CM | POA: Diagnosis not present

## 2024-05-02 MED ORDER — NITROGLYCERIN 0.4 MG SL SUBL
0.8000 mg | SUBLINGUAL_TABLET | Freq: Once | SUBLINGUAL | Status: AC
  Administered 2024-05-02: 0.8 mg via SUBLINGUAL

## 2024-05-02 MED ORDER — IOHEXOL 350 MG/ML SOLN
100.0000 mL | Freq: Once | INTRAVENOUS | Status: AC | PRN
  Administered 2024-05-02: 100 mL via INTRAVENOUS

## 2024-05-06 ENCOUNTER — Other Ambulatory Visit: Payer: Self-pay | Admitting: Internal Medicine

## 2024-05-06 DIAGNOSIS — E785 Hyperlipidemia, unspecified: Secondary | ICD-10-CM

## 2024-05-28 ENCOUNTER — Telehealth: Payer: Self-pay | Admitting: *Deleted

## 2024-05-28 NOTE — Progress Notes (Unsigned)
 Complex Care Management Note Care Guide Note  05/28/2024 Name: Jeremy Shannon MRN: [id number] DOB: 10-05-1955   Complex Care Management Outreach Attempts: An unsuccessful telephone outreach was attempted today to offer the patient information about available complex care management services.  Follow Up Plan:  Additional outreach attempts will be made to offer the patient complex care management information and services.   Encounter Outcome:  No Answer  Thedford Franks, CMA, Care Guide [facility]  [facility], [facility] Guide Direct Dial: [phone]  Fax: [phone] Website: [email or web address]

## 2024-05-29 NOTE — Progress Notes (Unsigned)
 Complex Care Management Note Care Guide Note  05/29/2024 Name: Jeremy Shannon MRN: [id number] DOB: [birth year]   Complex Care Management Outreach Attempts: A second unsuccessful outreach was attempted today to offer the patient with information about available complex care management services.  Follow Up Plan:  Additional outreach attempts will be made to offer the patient complex care management information and services.   Encounter Outcome:  No Answer  Thedford Franks, CMA [place]  [facility], [facility] Guide Direct Dial: [phone]  Fax: [phone] Website: [email or web address]

## 2024-05-30 NOTE — Progress Notes (Signed)
 Complex Care Management Note Care Guide Note  05/30/2024 Name: Jeremy Shannon MRN: [id number] DOB: [birth year]   Complex Care Management Outreach Attempts: A third unsuccessful outreach was attempted today to offer the patient with information about available complex care management services.  Follow Up Plan:  No further outreach attempts will be made at this time. We have been unable to contact the patient to offer or enroll patient in complex care management services.  Encounter Outcome:  No Answer  Thedford Franks, CMA [place]  [facility], [facility] Guide Direct Dial: [phone]  Fax: [phone] Website: [email or web address]

## 2024-06-21 ENCOUNTER — Other Ambulatory Visit (HOSPITAL_BASED_OUTPATIENT_CLINIC_OR_DEPARTMENT_OTHER): Payer: Self-pay

## 2024-06-21 MED ORDER — COMIRNATY 30 MCG/0.3ML IM SUSY
0.3000 mL | PREFILLED_SYRINGE | Freq: Once | INTRAMUSCULAR | 0 refills | Status: AC
  Filled 2024-06-21: qty 0.3, 1d supply, fill #0

## 2024-07-10 ENCOUNTER — Other Ambulatory Visit (HOSPITAL_BASED_OUTPATIENT_CLINIC_OR_DEPARTMENT_OTHER): Payer: Self-pay

## 2024-07-10 MED ORDER — FLUZONE HIGH-DOSE 0.5 ML IM SUSY
0.5000 mL | PREFILLED_SYRINGE | Freq: Once | INTRAMUSCULAR | 0 refills | Status: AC
Start: 2024-07-10 — End: 2024-07-11
  Filled 2024-07-10: qty 0.5, 1d supply, fill #0

## 2024-07-17 ENCOUNTER — Other Ambulatory Visit: Payer: Self-pay | Admitting: Internal Medicine

## 2024-07-17 DIAGNOSIS — E781 Pure hyperglyceridemia: Secondary | ICD-10-CM
